# Patient Record
Sex: Female | Born: 1960 | Hispanic: Yes | Marital: Married | Smoking: Never smoker
Health system: Southern US, Community
[De-identification: ages and names within clinical notes are randomized; demographics above are authoritative.]

## PROBLEM LIST (undated history)

## (undated) DIAGNOSIS — E785 Hyperlipidemia, unspecified: Secondary | ICD-10-CM

## (undated) DIAGNOSIS — E1140 Type 2 diabetes mellitus with diabetic neuropathy, unspecified: Secondary | ICD-10-CM

## (undated) DIAGNOSIS — I10 Essential (primary) hypertension: Secondary | ICD-10-CM

## (undated) DIAGNOSIS — R51 Headache: Secondary | ICD-10-CM

## (undated) DIAGNOSIS — E119 Type 2 diabetes mellitus without complications: Secondary | ICD-10-CM

## (undated) DIAGNOSIS — I499 Cardiac arrhythmia, unspecified: Secondary | ICD-10-CM

## (undated) DIAGNOSIS — I2510 Atherosclerotic heart disease of native coronary artery without angina pectoris: Secondary | ICD-10-CM

## (undated) DIAGNOSIS — R519 Headache, unspecified: Secondary | ICD-10-CM

## (undated) DIAGNOSIS — R0600 Dyspnea, unspecified: Secondary | ICD-10-CM

## (undated) DIAGNOSIS — E875 Hyperkalemia: Secondary | ICD-10-CM

## (undated) HISTORY — DX: Atherosclerotic heart disease of native coronary artery without angina pectoris: I25.10

## (undated) HISTORY — PX: TUBAL LIGATION: SHX77

---

## 2002-11-21 ENCOUNTER — Ambulatory Visit (HOSPITAL_COMMUNITY): Admission: RE | Admit: 2002-11-21 | Discharge: 2002-11-21 | Payer: Self-pay | Admitting: Family Medicine

## 2010-02-11 ENCOUNTER — Ambulatory Visit (HOSPITAL_COMMUNITY): Admission: RE | Admit: 2010-02-11 | Discharge: 2010-02-11 | Payer: Self-pay | Admitting: Family Medicine

## 2011-06-02 ENCOUNTER — Other Ambulatory Visit (HOSPITAL_COMMUNITY): Payer: Self-pay | Admitting: Family Medicine

## 2011-06-02 DIAGNOSIS — Z139 Encounter for screening, unspecified: Secondary | ICD-10-CM

## 2011-06-12 ENCOUNTER — Ambulatory Visit (HOSPITAL_COMMUNITY)
Admission: RE | Admit: 2011-06-12 | Discharge: 2011-06-12 | Disposition: A | Discharge status: Home / Self Care | Payer: Self-pay | Source: Ambulatory Visit | Attending: Family Medicine | Admitting: Family Medicine

## 2011-06-12 DIAGNOSIS — Z139 Encounter for screening, unspecified: Secondary | ICD-10-CM

## 2012-08-05 ENCOUNTER — Other Ambulatory Visit (HOSPITAL_COMMUNITY): Payer: Self-pay | Admitting: Nurse Practitioner

## 2012-08-05 DIAGNOSIS — Z139 Encounter for screening, unspecified: Secondary | ICD-10-CM

## 2012-08-10 ENCOUNTER — Ambulatory Visit (HOSPITAL_COMMUNITY)
Admission: RE | Admit: 2012-08-10 | Discharge: 2012-08-10 | Disposition: A | Discharge status: Home / Self Care | Payer: Self-pay | Source: Ambulatory Visit | Attending: Nurse Practitioner | Admitting: Nurse Practitioner

## 2012-08-10 DIAGNOSIS — Z139 Encounter for screening, unspecified: Secondary | ICD-10-CM

## 2013-05-11 ENCOUNTER — Encounter (HOSPITAL_COMMUNITY): Payer: Self-pay | Admitting: *Deleted

## 2013-05-11 ENCOUNTER — Emergency Department (HOSPITAL_COMMUNITY)
Admission: EM | Admit: 2013-05-11 | Discharge: 2013-05-12 | Disposition: A | Discharge status: Home / Self Care | Payer: Self-pay | Attending: Emergency Medicine | Admitting: Emergency Medicine

## 2013-05-11 DIAGNOSIS — R5381 Other malaise: Secondary | ICD-10-CM | POA: Insufficient documentation

## 2013-05-11 DIAGNOSIS — R112 Nausea with vomiting, unspecified: Secondary | ICD-10-CM | POA: Insufficient documentation

## 2013-05-11 DIAGNOSIS — Z794 Long term (current) use of insulin: Secondary | ICD-10-CM | POA: Insufficient documentation

## 2013-05-11 DIAGNOSIS — B9789 Other viral agents as the cause of diseases classified elsewhere: Secondary | ICD-10-CM | POA: Insufficient documentation

## 2013-05-11 DIAGNOSIS — I10 Essential (primary) hypertension: Secondary | ICD-10-CM | POA: Insufficient documentation

## 2013-05-11 DIAGNOSIS — IMO0001 Reserved for inherently not codable concepts without codable children: Secondary | ICD-10-CM | POA: Insufficient documentation

## 2013-05-11 DIAGNOSIS — E119 Type 2 diabetes mellitus without complications: Secondary | ICD-10-CM | POA: Insufficient documentation

## 2013-05-11 DIAGNOSIS — R51 Headache: Secondary | ICD-10-CM | POA: Insufficient documentation

## 2013-05-11 DIAGNOSIS — B349 Viral infection, unspecified: Secondary | ICD-10-CM

## 2013-05-11 DIAGNOSIS — E785 Hyperlipidemia, unspecified: Secondary | ICD-10-CM | POA: Insufficient documentation

## 2013-05-11 DIAGNOSIS — R630 Anorexia: Secondary | ICD-10-CM | POA: Insufficient documentation

## 2013-05-11 DIAGNOSIS — Z79899 Other long term (current) drug therapy: Secondary | ICD-10-CM | POA: Insufficient documentation

## 2013-05-11 DIAGNOSIS — R5383 Other fatigue: Secondary | ICD-10-CM | POA: Insufficient documentation

## 2013-05-11 HISTORY — DX: Essential (primary) hypertension: I10

## 2013-05-11 HISTORY — DX: Hyperlipidemia, unspecified: E78.5

## 2013-05-11 MED ORDER — KETOROLAC TROMETHAMINE 60 MG/2ML IM SOLN
60.0000 mg | Freq: Once | INTRAMUSCULAR | Status: AC
  Administered 2013-05-12: 60 mg via INTRAMUSCULAR
  Filled 2013-05-11: qty 2

## 2013-05-11 MED ORDER — PANTOPRAZOLE SODIUM 40 MG PO TBEC
40.0000 mg | DELAYED_RELEASE_TABLET | Freq: Once | ORAL | Status: AC
  Administered 2013-05-12: 40 mg via ORAL
  Filled 2013-05-11: qty 1

## 2013-05-11 MED ORDER — DIPHENHYDRAMINE HCL 25 MG PO CAPS
25.0000 mg | ORAL_CAPSULE | Freq: Once | ORAL | Status: AC
  Administered 2013-05-12: 25 mg via ORAL
  Filled 2013-05-11: qty 1

## 2013-05-11 MED ORDER — ONDANSETRON HCL 4 MG/2ML IJ SOLN: 4.0000 mg | Freq: Once | INTRAMUSCULAR | Status: DC

## 2013-05-11 NOTE — ED Notes (Signed)
Patient said she had taken her insulin around 7pm. Patient said her glucose level before 7pm was around 360.

## 2013-05-11 NOTE — ED Notes (Addendum)
Pt reporting headache, fatigue, nausea and vomiting beginning on Friday.  Reporting blood sugar elevated as well, result at home 348.  Pt also reporting right shoulder pain "for a long time" she wants checked while she's here.

## 2013-05-11 NOTE — ED Provider Notes (Signed)
History    CSN: [id number] Arrival date & time 05/11/13  2114  First MD Initiated Contact with Patient 05/11/13 2311     Chief Complaint  Patient presents with  . Headache  . Nausea  . Emesis   (Consider location/radiation/quality/duration/timing/severity/associated sxs/prior Treatment) HPI HPI Comments: Jill [name] is a 52 y.o. female who presents to the Emergency Department complaining of headache, body aches, fatigue, nausea, some vomiting, that began an Friday and has continued. Her blood sugar levels have been high despite taking her insulin. She has taken no additional medicines.  PCP [facility] Moss Past Medical History  Diagnosis Date  . Diabetes mellitus without complication   . Hypertension   . Hyperlipidemia    History reviewed. No pertinent past surgical history. History reviewed. No pertinent family history. History  Substance Use Topics  . Smoking status: Never Smoker   . Smokeless tobacco: Not on file  . Alcohol Use: No   OB History   Grav Para Term Preterm Abortions TAB SAB Ect Mult Living                 Review of Systems  Constitutional: Positive for appetite change and fatigue. Negative for fever.       10 Systems reviewed and are negative for acute change except as noted in the HPI.  HENT: Negative for congestion.   Eyes: Negative for discharge and redness.  Respiratory: Negative for cough and shortness of breath.   Cardiovascular: Negative for chest pain.  Gastrointestinal: Positive for nausea and vomiting. Negative for abdominal pain.  Musculoskeletal: Positive for myalgias. Negative for back pain.  Skin: Negative for rash.  Neurological: Positive for headaches. Negative for syncope and numbness.  Psychiatric/Behavioral:       No behavior change.    Allergies  Review of patient's allergies indicates no known allergies.  Home Medications   Current Outpatient Rx  Name  Route  Sig  Dispense  Refill  . ibuprofen (ADVIL,MOTRIN) 800 MG  tablet   Oral   Take 800 mg by mouth every 8 (eight) hours as needed for pain.         Marland Kitchen insulin aspart (NOVOLOG) 100 UNIT/ML injection   Subcutaneous   Inject 10 Units into the skin 3 (three) times daily with meals.         Marland Kitchen lisinopril (PRINIVIL,ZESTRIL) 10 MG tablet   Oral   Take 10 mg by mouth daily.         . metFORMIN (GLUMETZA) 1000 MG (MOD) 24 hr tablet   Oral   Take 1,000 mg by mouth daily with breakfast.         . rosuvastatin (CRESTOR) 20 MG tablet   Oral   Take 20 mg by mouth daily.          BP 122/106  Pulse 100  Temp(Src) 100.1 F (37.8 C) (Oral)  Resp 20  Ht 5\' 2"  (1.575 m)  Wt 220 lb (99.791 kg)  BMI 40.23 kg/m2  SpO2 98% Physical Exam  Nursing note and vitals reviewed. Constitutional: She appears well-developed and well-nourished.  Awake, alert, nontoxic appearance.  HENT:  Head: Atraumatic.  Eyes: EOM are normal. Pupils are equal, round, and reactive to light.  Neck: Normal range of motion. Neck supple.  Cardiovascular: Normal rate and intact distal pulses.   Pulmonary/Chest: Effort normal and breath sounds normal. She exhibits no tenderness.  Abdominal: Soft. Bowel sounds are normal. There is no tenderness. There is no rebound.  Musculoskeletal: She exhibits  no tenderness.  Baseline ROM, no obvious new focal weakness.  Neurological:  Mental status and motor strength appears baseline for patient and situation.  Skin: No rash noted.  Psychiatric: She has a normal mood and affect.    ED Course  Procedures (including critical care time) Results for orders placed during the hospital encounter of 05/11/13  GLUCOSE, CAPILLARY      Result Value Range   Glucose-Capillary 261 (*) 70 - 99 mg/dL   Medications  ketorolac (TORADOL) injection 60 mg (60 mg Intramuscular Given 05/12/13 0007)  diphenhydrAMINE (BENADRYL) capsule 25 mg (25 mg Oral Given 05/12/13 0007)  pantoprazole (PROTONIX) EC tablet 40 mg (40 mg Oral Given 05/12/13 0007)   ondansetron (ZOFRAN-ODT) disintegrating tablet 4 mg (4 mg Oral Given 05/12/13 0006)   MDM  Patient with headache, nausea, fatigue, body aches and pains. Given PPI, zofran, toradol, benadryl with improvement. Pt stable in ED with no significant deterioration in condition.The patient appears reasonably screened and/or stabilized for discharge and I doubt any other medical condition or other [facility] requiring further screening, evaluation, or treatment in the ED at this time prior to discharge.  MDM Reviewed: nursing note and vitals     Nicoletta Dress. Colon Branch, MD 05/12/13 4098

## 2013-05-12 MED ORDER — ONDANSETRON 4 MG PO TBDP
4.0000 mg | ORAL_TABLET | Freq: Once | ORAL | Status: AC
  Administered 2013-05-12: 4 mg via ORAL
  Filled 2013-05-12: qty 1

## 2013-05-12 MED ORDER — ONDANSETRON 4 MG PO TBDP: 4.0000 mg | ORAL_TABLET | Freq: Three times a day (TID) | ORAL | Status: DC | PRN

## 2013-05-20 ENCOUNTER — Encounter (HOSPITAL_COMMUNITY): Payer: Self-pay | Admitting: *Deleted

## 2013-05-20 ENCOUNTER — Emergency Department (HOSPITAL_COMMUNITY)
Admission: EM | Admit: 2013-05-20 | Discharge: 2013-05-20 | Disposition: A | Discharge status: Home / Self Care | Payer: Self-pay | Attending: Emergency Medicine | Admitting: Emergency Medicine

## 2013-05-20 DIAGNOSIS — R739 Hyperglycemia, unspecified: Secondary | ICD-10-CM

## 2013-05-20 DIAGNOSIS — I10 Essential (primary) hypertension: Secondary | ICD-10-CM | POA: Insufficient documentation

## 2013-05-20 DIAGNOSIS — E875 Hyperkalemia: Secondary | ICD-10-CM | POA: Insufficient documentation

## 2013-05-20 DIAGNOSIS — E1169 Type 2 diabetes mellitus with other specified complication: Secondary | ICD-10-CM | POA: Insufficient documentation

## 2013-05-20 DIAGNOSIS — E785 Hyperlipidemia, unspecified: Secondary | ICD-10-CM | POA: Insufficient documentation

## 2013-05-20 DIAGNOSIS — Z794 Long term (current) use of insulin: Secondary | ICD-10-CM | POA: Insufficient documentation

## 2013-05-20 DIAGNOSIS — Z9851 Tubal ligation status: Secondary | ICD-10-CM | POA: Insufficient documentation

## 2013-05-20 DIAGNOSIS — N390 Urinary tract infection, site not specified: Secondary | ICD-10-CM | POA: Insufficient documentation

## 2013-05-20 DIAGNOSIS — Z79899 Other long term (current) drug therapy: Secondary | ICD-10-CM | POA: Insufficient documentation

## 2013-05-20 LAB — CBC WITH DIFFERENTIAL/PLATELET
Basophils Relative: 1 % (ref 0–1)
HCT: 40 % (ref 36.0–46.0)
Hemoglobin: 12.6 g/dL (ref 12.0–15.0)
Lymphs Abs: 3.9 10*3/uL (ref 0.7–4.0)
MCHC: 31.5 g/dL (ref 30.0–36.0)
Monocytes Absolute: 0.7 10*3/uL (ref 0.1–1.0)
Monocytes Relative: 7 % (ref 3–12)
Neutro Abs: 5.1 10*3/uL (ref 1.7–7.7)
RBC: 4.91 MIL/uL (ref 3.87–5.11)

## 2013-05-20 LAB — BASIC METABOLIC PANEL
BUN: 22 mg/dL (ref 6–23)
CO2: 26 mEq/L (ref 19–32)
CO2: 27 mEq/L (ref 19–32)
Calcium: 9 mg/dL (ref 8.4–10.5)
Chloride: 96 mEq/L (ref 96–112)
Creatinine, Ser: 0.89 mg/dL (ref 0.50–1.10)
GFR calc Af Amer: 85 mL/min — ABNORMAL LOW (ref >90)
GFR calc non Af Amer: 81 mL/min — ABNORMAL LOW (ref >90)
Glucose, Bld: 237 mg/dL — ABNORMAL HIGH (ref 70–99)
Glucose, Bld: 260 mg/dL — ABNORMAL HIGH (ref 70–99)
Potassium: 5 mEq/L (ref 3.5–5.1)
Potassium: 5 mEq/L (ref 3.5–5.1)
Sodium: 135 mEq/L (ref 135–145)

## 2013-05-20 LAB — URINALYSIS, ROUTINE W REFLEX MICROSCOPIC
Glucose, UA: 100 mg/dL — AB
Protein, ur: NEGATIVE mg/dL
Specific Gravity, Urine: 1.02 (ref 1.005–1.030)

## 2013-05-20 LAB — BLOOD GAS, VENOUS
Acid-Base Excess: 1.7 mmol/L (ref 0.0–2.0)
Bicarbonate: 26.5 mEq/L — ABNORMAL HIGH (ref 20.0–24.0)
TCO2: 24.3 mmol/L (ref 0–100)
pCO2, Ven: 47.7 mmHg (ref 45.0–50.0)
pH, Ven: 7.364 — ABNORMAL HIGH (ref 7.250–7.300)
pO2, Ven: 23.1 mmHg — CL (ref 30.0–45.0)

## 2013-05-20 LAB — POTASSIUM: Potassium: 5.6 mEq/L — ABNORMAL HIGH (ref 3.5–5.1)

## 2013-05-20 LAB — URINE MICROSCOPIC-ADD ON

## 2013-05-20 LAB — GLUCOSE, CAPILLARY: Glucose-Capillary: 227 mg/dL — ABNORMAL HIGH (ref 70–99)

## 2013-05-20 MED ORDER — SODIUM CHLORIDE 0.9 % IV BOLUS (SEPSIS)
1000.0000 mL | Freq: Once | INTRAVENOUS | Status: AC
  Administered 2013-05-20: 1000 mL via INTRAVENOUS

## 2013-05-20 MED ORDER — DEXTROSE 50 % IV SOLN
INTRAVENOUS | Status: AC
  Filled 2013-05-20: qty 50

## 2013-05-20 MED ORDER — CEPHALEXIN 500 MG PO CAPS: 500.0000 mg | ORAL_CAPSULE | Freq: Four times a day (QID) | ORAL | Status: DC

## 2013-05-20 MED ORDER — INSULIN ASPART 100 UNIT/ML ~~LOC~~ SOLN
10.0000 [IU] | Freq: Once | SUBCUTANEOUS | Status: AC
  Administered 2013-05-20: 10 [IU] via SUBCUTANEOUS
  Filled 2013-05-20: qty 1

## 2013-05-20 MED ORDER — SODIUM BICARBONATE 8.4 % IV SOLN
50.0000 meq | Freq: Once | INTRAVENOUS | Status: AC
  Administered 2013-05-20: 50 meq via INTRAVENOUS
  Filled 2013-05-20: qty 50

## 2013-05-20 MED ORDER — DEXTROSE 50 % IV SOLN
50.0000 mL | Freq: Once | INTRAVENOUS | Status: AC
  Administered 2013-05-20: 50 mL via INTRAVENOUS

## 2013-05-20 MED ORDER — SODIUM POLYSTYRENE SULFONATE 15 GM/60ML PO SUSP
45.0000 g | Freq: Once | ORAL | Status: AC
  Administered 2013-05-20: 45 g via ORAL
  Filled 2013-05-20: qty 180

## 2013-05-20 MED ORDER — CEPHALEXIN 500 MG PO CAPS
500.0000 mg | ORAL_CAPSULE | Freq: Once | ORAL | Status: AC
  Administered 2013-05-20: 500 mg via ORAL
  Filled 2013-05-20: qty 1

## 2013-05-20 NOTE — ED Notes (Signed)
Dr.rancour notified of pt's venous abg results

## 2013-05-20 NOTE — ED Notes (Signed)
Dr.rancour to see pt.

## 2013-05-20 NOTE — ED Provider Notes (Signed)
CSN: [id number]     Arrival date & time 05/20/13  0915 History    This chart was scribed for Glynn Octave, MD by Quintella Reichert, ED scribe.  This patient was seen in room APA04/APA04 and the patient's care was started at 9:30 AM.     Chief Complaint  Patient presents with  . Hyperkalemia    The history is provided by the patient. No language interpreter was used.    HPI Comments: Jill [name] is a 52 y.o. female with h/o DM, HTN and hyperlipidemia who presents to the Emergency Department complaining of hyperkalemia based on blood-work taken yesterday.  Pt reports that yesterday she [facility] and today she was called and informed that her labs revealed elevated potassium levels and advised to come to the ED.  Currently she is asymptomatic.  She denies fever, HA, CP, abdominal pain, emesis, diarrhea, constipation, urinary frequency, dysuria, cough, sore throat or any other associated symptoms.  She has been eating and drinking normally.  Pt also notes that she has been having blood glucose readings above 400 on her home meter for the past week and has recently been experiencing severe intermittent bilateral cramping leg pain.  Presently she denies leg pain.  She notes that she did not take any of her medications this morning.  Otherwise she denies any recent missed medication doses or changes to medication.    Past Medical History  Diagnosis Date  . Diabetes mellitus without complication   . Hypertension   . Hyperlipidemia     Past Surgical History  Procedure Laterality Date  . Tubal ligation       History reviewed. No pertinent family history.   History  Substance Use Topics  . Smoking status: Never Smoker   . Smokeless tobacco: Not on file  . Alcohol Use: No    OB History   Grav Para Term Preterm Abortions TAB SAB Ect Mult Living   4 4 4               Review of Systems A complete 10 system review of systems was obtained and  all systems are negative except as noted in the HPI and PMH.     Allergies  Review of patient's allergies indicates no known allergies.  Home Medications   Current Outpatient Rx  Name  Route  Sig  Dispense  Refill  . ibuprofen (ADVIL,MOTRIN) 800 MG tablet   Oral   Take 800 mg by mouth every 8 (eight) hours as needed for pain.         Marland Kitchen insulin aspart (NOVOLOG) 100 UNIT/ML injection   Subcutaneous   Inject 10 Units into the skin 3 (three) times daily with meals.         Marland Kitchen lisinopril (PRINIVIL,ZESTRIL) 10 MG tablet   Oral   Take 10 mg by mouth daily.         . metFORMIN (GLUMETZA) 1000 MG (MOD) 24 hr tablet   Oral   Take 1,000 mg by mouth 2 (two) times daily with a meal.          . ondansetron (ZOFRAN ODT) 4 MG disintegrating tablet   Oral   Take 1 tablet (4 mg total) by mouth every 8 (eight) hours as needed for nausea.   20 tablet   0   . rosuvastatin (CRESTOR) 20 MG tablet   Oral   Take 20 mg by mouth daily.         Marland Kitchen  cephALEXin (KEFLEX) 500 MG capsule   Oral   Take 1 capsule (500 mg total) by mouth 4 (four) times daily.   40 capsule   0    BP 145/77  Pulse 85  Temp(Src) 98.4 F (36.9 C) (Oral)  Resp 14  Ht 5\' 3"  (1.6 m)  Wt 225 lb (102.059 kg)  BMI 39.87 kg/m2  SpO2 99%  Physical Exam  Nursing note and vitals reviewed. Constitutional: She is oriented to person, place, and time. She appears well-developed and well-nourished. No distress.  HENT:  Head: Normocephalic and atraumatic.  Mouth/Throat: Mucous membranes are normal.  Eyes: Conjunctivae and EOM are normal.  Neck: Neck supple.  Cardiovascular: Normal rate, regular rhythm and normal heart sounds.   No murmur heard. Pulmonary/Chest: Effort normal and breath sounds normal. No respiratory distress. She has no wheezes. She has no rales.  Abdominal: Soft. There is no tenderness.  Musculoskeletal: Normal range of motion.  Neurological: She is alert and oriented to person, place, and time.  She has normal strength.  No ataxia on finger to nose, 5/5 strength throughout   Skin: Skin is warm and dry.  Psychiatric: She has a normal mood and affect. Her behavior is normal.    ED Course  Procedures (including critical care time)  DIAGNOSTIC STUDIES: Oxygen Saturation is 99% on room air, normal by my interpretation.    COORDINATION OF CARE: 9:35 AM-Discussed treatment plan which includes EKG and labs with pt at bedside and pt agreed to plan.   10:53 AM: Informed pt that labs rule out severe hyperkalemia but did reveal UTI.  Discussed treatment plan which includes antibiotics with pt at bedside and pt agreed to plan.   11:14 AM: Informed pt that labs now show a higher potassium level.  Pt mentions that she is already on Macrobid for a UTI.  Will treat hyperkalemia, d/c lisinopril.   Labs Reviewed  GLUCOSE, CAPILLARY - Abnormal; Notable for the following:    Glucose-Capillary 227 (*)    All other components within normal limits  CBC WITH DIFFERENTIAL - Abnormal; Notable for the following:    MCH 25.7 (*)    Platelets 527 (*)    All other components within normal limits  BASIC METABOLIC PANEL - Abnormal; Notable for the following:    Sodium 133 (*)    Glucose, Bld 237 (*)    GFR calc non Af Amer 73 (*)    GFR calc Af Amer 85 (*)    All other components within normal limits  BLOOD GAS, VENOUS - Abnormal; Notable for the following:    pH, Ven 7.364 (*)    pO2, Ven 23.1 (*)    Bicarbonate 26.5 (*)    All other components within normal limits  URINALYSIS, ROUTINE W REFLEX MICROSCOPIC - Abnormal; Notable for the following:    Glucose, UA 100 (*)    Hgb urine dipstick TRACE (*)    Leukocytes, UA MODERATE (*)    All other components within normal limits  URINE MICROSCOPIC-ADD ON - Abnormal; Notable for the following:    Squamous Epithelial / LPF MANY (*)    Bacteria, UA MANY (*)    All other components within normal limits  POTASSIUM - Abnormal; Notable for the following:     Potassium 5.6 (*)    All other components within normal limits  BASIC METABOLIC PANEL - Abnormal; Notable for the following:    Glucose, Bld 260 (*)    GFR calc non Af Amer 81 (*)  All other components within normal limits  URINE CULTURE  KETONES, QUALITATIVE    No results found.  1. Hyperkalemia   2. Hyperglycemia   3. Urinary tract infection     MDM  Hx DM, saw health department yesterday for routine labs and referred to ED for potassium of 6.3. Denies symptoms. No fever, chest pain, SOB, nausea, vomiting, abdominal pain.  Suspect hyperkalemia report was spurious. No EKG changes to suggest hyperkalemia. No evidence of DKA. Anion gap 9, sugar 237. Will treat UTI with keflex.  K is 5 on labs today. No EKG changes.  Cr normal. K 5.6 on recheck. Will treat.  Stop lisinopril.   K down to 5. Patient asymptomatic.  Followup for recheck of chemistry with PCP.  Stop lisinopril. Keflex for UTI.   Date: 05/20/2013  Rate: 80  Rhythm: normal sinus rhythm  QRS Axis: normal  Intervals: normal  ST/T Wave abnormalities: normal  Conduction Disutrbances:none  Narrative Interpretation:   Old EKG Reviewed: none available      I personally performed the services described in this documentation, which was scribed in my presence. The recorded information has been reviewed and is accurate.    Glynn Octave, MD 05/20/13 [phone]

## 2013-05-20 NOTE — ED Notes (Signed)
Patient has been having greater than 400 glucose readings on home meter x 1 week.  Saw MD at health department yesterday and had labs drawn.  This AM they called and instructed her to come to ER d/t hyperkalemia.  Their labs indicated a blood glucose of 131.

## 2013-05-21 LAB — URINE CULTURE: Colony Count: >=100000

## 2014-03-08 ENCOUNTER — Emergency Department (HOSPITAL_COMMUNITY): Payer: No Typology Code available for payment source

## 2014-03-08 ENCOUNTER — Emergency Department (HOSPITAL_COMMUNITY)
Admission: EM | Admit: 2014-03-08 | Discharge: 2014-03-08 | Disposition: A | Discharge status: Home / Self Care | Payer: No Typology Code available for payment source | Attending: Emergency Medicine | Admitting: Emergency Medicine

## 2014-03-08 ENCOUNTER — Encounter (HOSPITAL_COMMUNITY): Payer: Self-pay | Admitting: Emergency Medicine

## 2014-03-08 DIAGNOSIS — E119 Type 2 diabetes mellitus without complications: Secondary | ICD-10-CM | POA: Insufficient documentation

## 2014-03-08 DIAGNOSIS — Z79899 Other long term (current) drug therapy: Secondary | ICD-10-CM | POA: Insufficient documentation

## 2014-03-08 DIAGNOSIS — S40019A Contusion of unspecified shoulder, initial encounter: Secondary | ICD-10-CM | POA: Insufficient documentation

## 2014-03-08 DIAGNOSIS — Z792 Long term (current) use of antibiotics: Secondary | ICD-10-CM | POA: Insufficient documentation

## 2014-03-08 DIAGNOSIS — E785 Hyperlipidemia, unspecified: Secondary | ICD-10-CM | POA: Insufficient documentation

## 2014-03-08 DIAGNOSIS — Z794 Long term (current) use of insulin: Secondary | ICD-10-CM | POA: Insufficient documentation

## 2014-03-08 DIAGNOSIS — Y9389 Activity, other specified: Secondary | ICD-10-CM | POA: Insufficient documentation

## 2014-03-08 DIAGNOSIS — I10 Essential (primary) hypertension: Secondary | ICD-10-CM | POA: Insufficient documentation

## 2014-03-08 DIAGNOSIS — S0993XA Unspecified injury of face, initial encounter: Secondary | ICD-10-CM | POA: Insufficient documentation

## 2014-03-08 DIAGNOSIS — S199XXA Unspecified injury of neck, initial encounter: Secondary | ICD-10-CM

## 2014-03-08 DIAGNOSIS — Y92410 Unspecified street and highway as the place of occurrence of the external cause: Secondary | ICD-10-CM | POA: Insufficient documentation

## 2014-03-08 DIAGNOSIS — S40011A Contusion of right shoulder, initial encounter: Secondary | ICD-10-CM

## 2014-03-08 NOTE — Discharge Instructions (Signed)
Follow up with your md for recheck if needed.  Motrin for pain

## 2014-03-08 NOTE — ED Notes (Signed)
Backboard removed, pt denies point tenderness to back and neck, neck remains in C-collar, pt co rt arm/shoulder pain/tenderness/tingling.

## 2014-03-08 NOTE — ED Notes (Signed)
Discharge instructions reviewed with pt, questions answered. Pt verbalized understanding.  
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
## 2014-08-21 ENCOUNTER — Encounter (HOSPITAL_COMMUNITY): Payer: Self-pay | Admitting: Emergency Medicine

## 2016-03-05 ENCOUNTER — Observation Stay (HOSPITAL_COMMUNITY): Payer: Self-pay

## 2016-03-05 ENCOUNTER — Emergency Department (HOSPITAL_COMMUNITY): Payer: Self-pay

## 2016-03-05 ENCOUNTER — Inpatient Hospital Stay (HOSPITAL_COMMUNITY)
Admission: EM | Admit: 2016-03-05 | Discharge: 2016-03-11 | DRG: 193 | Disposition: A | Discharge status: Home / Self Care | Payer: Self-pay | Attending: Internal Medicine | Admitting: Internal Medicine

## 2016-03-05 ENCOUNTER — Encounter (HOSPITAL_COMMUNITY): Payer: Self-pay

## 2016-03-05 DIAGNOSIS — E785 Hyperlipidemia, unspecified: Secondary | ICD-10-CM | POA: Diagnosis present

## 2016-03-05 DIAGNOSIS — E860 Dehydration: Secondary | ICD-10-CM | POA: Diagnosis present

## 2016-03-05 DIAGNOSIS — R739 Hyperglycemia, unspecified: Secondary | ICD-10-CM | POA: Diagnosis present

## 2016-03-05 DIAGNOSIS — I10 Essential (primary) hypertension: Secondary | ICD-10-CM | POA: Diagnosis present

## 2016-03-05 DIAGNOSIS — E119 Type 2 diabetes mellitus without complications: Secondary | ICD-10-CM

## 2016-03-05 DIAGNOSIS — R0902 Hypoxemia: Secondary | ICD-10-CM | POA: Diagnosis present

## 2016-03-05 DIAGNOSIS — J9600 Acute respiratory failure, unspecified whether with hypoxia or hypercapnia: Secondary | ICD-10-CM

## 2016-03-05 DIAGNOSIS — Z23 Encounter for immunization: Secondary | ICD-10-CM

## 2016-03-05 DIAGNOSIS — D72829 Elevated white blood cell count, unspecified: Secondary | ICD-10-CM | POA: Diagnosis present

## 2016-03-05 DIAGNOSIS — J189 Pneumonia, unspecified organism: Secondary | ICD-10-CM | POA: Diagnosis present

## 2016-03-05 DIAGNOSIS — E871 Hypo-osmolality and hyponatremia: Secondary | ICD-10-CM | POA: Diagnosis present

## 2016-03-05 DIAGNOSIS — J180 Bronchopneumonia, unspecified organism: Principal | ICD-10-CM | POA: Diagnosis present

## 2016-03-05 DIAGNOSIS — E1165 Type 2 diabetes mellitus with hyperglycemia: Secondary | ICD-10-CM | POA: Diagnosis present

## 2016-03-05 DIAGNOSIS — IMO0001 Reserved for inherently not codable concepts without codable children: Secondary | ICD-10-CM

## 2016-03-05 DIAGNOSIS — E1142 Type 2 diabetes mellitus with diabetic polyneuropathy: Secondary | ICD-10-CM | POA: Diagnosis present

## 2016-03-05 DIAGNOSIS — D649 Anemia, unspecified: Secondary | ICD-10-CM | POA: Diagnosis present

## 2016-03-05 DIAGNOSIS — J9601 Acute respiratory failure with hypoxia: Secondary | ICD-10-CM | POA: Diagnosis present

## 2016-03-05 DIAGNOSIS — Z8249 Family history of ischemic heart disease and other diseases of the circulatory system: Secondary | ICD-10-CM

## 2016-03-05 DIAGNOSIS — Z833 Family history of diabetes mellitus: Secondary | ICD-10-CM

## 2016-03-05 DIAGNOSIS — Z794 Long term (current) use of insulin: Secondary | ICD-10-CM

## 2016-03-05 LAB — COMPREHENSIVE METABOLIC PANEL
ALK PHOS: 68 U/L (ref 38–126)
ALT: 13 U/L — AB (ref 14–54)
AST: 15 U/L (ref 15–41)
Albumin: 3.7 g/dL (ref 3.5–5.0)
Anion gap: 8 (ref 5–15)
BUN: 29 mg/dL — AB (ref 6–20)
CALCIUM: 8.7 mg/dL — AB (ref 8.9–10.3)
CHLORIDE: 101 mmol/L (ref 101–111)
CO2: 25 mmol/L (ref 22–32)
CREATININE: 0.93 mg/dL (ref 0.44–1.00)
GFR calc non Af Amer: >60 mL/min (ref >60)
GLUCOSE: 355 mg/dL — AB (ref 65–99)
Potassium: 4.7 mmol/L (ref 3.5–5.1)
SODIUM: 134 mmol/L — AB (ref 135–145)
Total Bilirubin: 0.4 mg/dL (ref 0.3–1.2)
Total Protein: 7.7 g/dL (ref 6.5–8.1)

## 2016-03-05 LAB — CBC WITH DIFFERENTIAL/PLATELET
BASOS ABS: 0 10*3/uL (ref 0.0–0.1)
Basophils Relative: 0 %
EOS ABS: 0.1 10*3/uL (ref 0.0–0.7)
EOS PCT: 0 %
HCT: 35.3 % — ABNORMAL LOW (ref 36.0–46.0)
HEMOGLOBIN: 11.2 g/dL — AB (ref 12.0–15.0)
LYMPHS ABS: 1.2 10*3/uL (ref 0.7–4.0)
LYMPHS PCT: 7 %
MCH: 26.4 pg (ref 26.0–34.0)
MCHC: 31.7 g/dL (ref 30.0–36.0)
MCV: 83.1 fL (ref 78.0–100.0)
Monocytes Absolute: 0.9 10*3/uL (ref 0.1–1.0)
Monocytes Relative: 5 %
NEUTROS PCT: 88 %
Neutro Abs: 14.8 10*3/uL — ABNORMAL HIGH (ref 1.7–7.7)
PLATELETS: 304 10*3/uL (ref 150–400)
RBC: 4.25 MIL/uL (ref 3.87–5.11)
RDW: 13.5 % (ref 11.5–15.5)
WBC: 16.9 10*3/uL — AB (ref 4.0–10.5)

## 2016-03-05 LAB — URINALYSIS, ROUTINE W REFLEX MICROSCOPIC
BILIRUBIN URINE: NEGATIVE
HGB URINE DIPSTICK: NEGATIVE
Leukocytes, UA: NEGATIVE
Nitrite: NEGATIVE
PH: 5.5 (ref 5.0–8.0)
Protein, ur: NEGATIVE mg/dL
SPECIFIC GRAVITY, URINE: 1.01 (ref 1.005–1.030)

## 2016-03-05 LAB — CBG MONITORING, ED: GLUCOSE-CAPILLARY: 334 mg/dL — AB (ref 65–99)

## 2016-03-05 LAB — GLUCOSE, CAPILLARY: GLUCOSE-CAPILLARY: 258 mg/dL — AB (ref 65–99)

## 2016-03-05 LAB — URINE MICROSCOPIC-ADD ON

## 2016-03-05 LAB — D-DIMER, QUANTITATIVE: D-Dimer, Quant: 0.65 ug/mL-FEU — ABNORMAL HIGH (ref 0.00–0.50)

## 2016-03-05 LAB — PROCALCITONIN: Procalcitonin: <0.1 ng/mL

## 2016-03-05 MED ORDER — ACETAMINOPHEN 325 MG PO TABS
650.0000 mg | ORAL_TABLET | Freq: Four times a day (QID) | ORAL | Status: DC | PRN
  Administered 2016-03-05 – 2016-03-10 (×9): 650 mg via ORAL
  Filled 2016-03-05 (×10): qty 2

## 2016-03-05 MED ORDER — GABAPENTIN 400 MG PO CAPS
400.0000 mg | ORAL_CAPSULE | Freq: Once | ORAL | Status: AC
  Administered 2016-03-05: 400 mg via ORAL
  Filled 2016-03-05: qty 1

## 2016-03-05 MED ORDER — ONDANSETRON HCL 4 MG/2ML IJ SOLN
4.0000 mg | Freq: Once | INTRAMUSCULAR | Status: AC
  Administered 2016-03-05: 4 mg via INTRAVENOUS
  Filled 2016-03-05: qty 2

## 2016-03-05 MED ORDER — GABAPENTIN 400 MG PO CAPS
400.0000 mg | ORAL_CAPSULE | Freq: Three times a day (TID) | ORAL | Status: DC
  Administered 2016-03-06 – 2016-03-11 (×16): 400 mg via ORAL
  Filled 2016-03-05 (×16): qty 1

## 2016-03-05 MED ORDER — IOPAMIDOL (ISOVUE-370) INJECTION 76%
100.0000 mL | Freq: Once | INTRAVENOUS | Status: AC | PRN
  Administered 2016-03-05: 100 mL via INTRAVENOUS

## 2016-03-05 MED ORDER — INSULIN ASPART 100 UNIT/ML ~~LOC~~ SOLN
4.0000 [IU] | Freq: Three times a day (TID) | SUBCUTANEOUS | Status: DC
  Administered 2016-03-06 – 2016-03-11 (×13): 4 [IU] via SUBCUTANEOUS

## 2016-03-05 MED ORDER — SODIUM CHLORIDE 0.9 % IV BOLUS (SEPSIS)
500.0000 mL | Freq: Once | INTRAVENOUS | Status: AC
  Administered 2016-03-05: 500 mL via INTRAVENOUS

## 2016-03-05 MED ORDER — ENOXAPARIN SODIUM 40 MG/0.4ML ~~LOC~~ SOLN
40.0000 mg | SUBCUTANEOUS | Status: DC
  Administered 2016-03-05 – 2016-03-10 (×6): 40 mg via SUBCUTANEOUS
  Filled 2016-03-05 (×6): qty 0.4

## 2016-03-05 MED ORDER — PNEUMOCOCCAL VAC POLYVALENT 25 MCG/0.5ML IJ INJ: 0.5000 mL | INJECTION | INTRAMUSCULAR | Status: DC

## 2016-03-05 MED ORDER — INSULIN ASPART 100 UNIT/ML ~~LOC~~ SOLN
10.0000 [IU] | Freq: Once | SUBCUTANEOUS | Status: AC
  Administered 2016-03-05: 10 [IU] via SUBCUTANEOUS

## 2016-03-05 MED ORDER — DEXTROSE 5 % IV SOLN
2.0000 g | INTRAVENOUS | Status: DC
  Administered 2016-03-06 – 2016-03-11 (×6): 2 g via INTRAVENOUS
  Filled 2016-03-05 (×7): qty 2

## 2016-03-05 MED ORDER — AZITHROMYCIN 250 MG PO TABS
500.0000 mg | ORAL_TABLET | Freq: Once | ORAL | Status: AC
  Administered 2016-03-05: 500 mg via ORAL
  Filled 2016-03-05: qty 2

## 2016-03-05 MED ORDER — INSULIN ASPART 100 UNIT/ML ~~LOC~~ SOLN
0.0000 [IU] | Freq: Three times a day (TID) | SUBCUTANEOUS | Status: DC
  Administered 2016-03-06: 2 [IU] via SUBCUTANEOUS
  Administered 2016-03-06: 5 [IU] via SUBCUTANEOUS
  Administered 2016-03-06 – 2016-03-07 (×2): 3 [IU] via SUBCUTANEOUS
  Administered 2016-03-07: 8 [IU] via SUBCUTANEOUS
  Administered 2016-03-07: 3 [IU] via SUBCUTANEOUS
  Administered 2016-03-08: 8 [IU] via SUBCUTANEOUS
  Administered 2016-03-08 (×2): 3 [IU] via SUBCUTANEOUS
  Administered 2016-03-09: 2 [IU] via SUBCUTANEOUS
  Administered 2016-03-09 – 2016-03-10 (×3): 3 [IU] via SUBCUTANEOUS
  Administered 2016-03-10: 2 [IU] via SUBCUTANEOUS
  Administered 2016-03-10: 3 [IU] via SUBCUTANEOUS
  Administered 2016-03-11: 2 [IU] via SUBCUTANEOUS
  Administered 2016-03-11: 3 [IU] via SUBCUTANEOUS

## 2016-03-05 MED ORDER — SODIUM CHLORIDE 0.9 % IV SOLN
INTRAVENOUS | Status: AC
  Administered 2016-03-05: 19:00:00 via INTRAVENOUS

## 2016-03-05 MED ORDER — DEXTROSE 5 % IV SOLN
1.0000 g | Freq: Once | INTRAVENOUS | Status: AC
  Administered 2016-03-05: 1 g via INTRAVENOUS
  Filled 2016-03-05: qty 10

## 2016-03-05 MED ORDER — SODIUM CHLORIDE 0.9 % IV BOLUS (SEPSIS)
1000.0000 mL | Freq: Once | INTRAVENOUS | Status: AC
  Administered 2016-03-05: 1000 mL via INTRAVENOUS

## 2016-03-05 MED ORDER — DEXTROSE 5 % IV SOLN
500.0000 mg | INTRAVENOUS | Status: DC
  Administered 2016-03-06 – 2016-03-09 (×4): 500 mg via INTRAVENOUS
  Filled 2016-03-05 (×5): qty 500

## 2016-03-05 MED ORDER — IBUPROFEN 400 MG PO TABS
600.0000 mg | ORAL_TABLET | Freq: Once | ORAL | Status: AC
  Administered 2016-03-05: 600 mg via ORAL
  Filled 2016-03-05: qty 2

## 2016-03-05 NOTE — ED Notes (Signed)
CBG 334 in triage

## 2016-03-05 NOTE — ED Notes (Signed)
Hospitalist at bedside at this time 

## 2016-03-05 NOTE — ED Notes (Signed)
Pt was able to walk to the bathroom and back to her room holding on to me. Her oxygen was steady at 85 percent while ambulating.

## 2016-03-05 NOTE — Progress Notes (Signed)
Pharmacy Antibiotic Note  Jill [name] is a 55 y.o. female admitted on 03/05/2016 with pneumonia.  Pharmacy has been consulted for rocephin dosing.  Patient received rocephin 1g x1 at 1605 today.  Plan: Rocephin 2g IV q24h starting 5/18 at 0600  Height: 5\' 3"  (160 cm) Weight: 191 lb 8 oz (86.864 kg) IBW/kg (Calculated) : 52.4  Temp (24hrs), Avg:99.3 F (37.4 C), Min:98.2 F (36.8 C), Max:102.2 F (39 C)   Recent Labs Lab 03/05/16 1158  WBC 16.9*  CREATININE 0.93    Estimated Creatinine Clearance: 71.4 mL/min (by C-G formula based on Cr of 0.93).    No Known Allergies  Thank you for allowing pharmacy to be a part of this patient's care.  Drusilla Kanner 03/05/2016 11:21 PM

## 2016-03-05 NOTE — H&P (Signed)
History and Physical    Jill [name] ZOX:[id number] DOB: 1961/05/30 DOA: 03/05/2016  PCP: [facility]   Patient coming from: Home  Chief Complaint: Generalized weakness, nausea vomiting  HPI: Jill [name] is a 55 y.o. 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 Patient reports pain in her usual state of health until approximately 2 months ago when she noted the insidious development of generalized weakness and dizziness, particularly upon waking. There has been no chest pain, palpitations, edema, or orthopnea over this interval. She endorses an occasional cough productive of sputum but denies any significant dyspnea. Up until yesterday, there is no nausea or vomiting associated with this. There is been no diarrhea. Patient denies any recent long distance travel. Symptoms began to worsen significantly over the past day, marked by increasing weakness and malaise. She reports seeing a small streak of blood in her sputum yesterday and a fleck of blood in her emesis earlier today. She denies melena or hematochezia. She is not anticoagulated and does not take any antiplatelet.   ED Course: Upon arrival to the ED, patient is found to be afebrile, saturating low 90s on 2 L/m supplemental oxygen, tachycardic in the low 100s, and with blood pressure in the 90/55 range. EKG featured a sinus tachycardia with low voltage QRS. Chest x-ray is negative for acute cardiopulmonary disease. Urine was obtained for analysis and features greater than 1000 glucose and trace ketones. CMP is notable for a mild hyponatremia, WJX:BJYNWGNFAO ratio of >30, and glucose of 355. CBC features a leukocytosis of 16,900 and hemoglobin of 11.2 with  normal MCV. A bolus of 2 L normal saline was administered in the emergency department and symptomatic care was provided with Zofran, gabapentin, and ibuprofen. Patient was started on empiric Rocephin and azithromycin for suspected CAP. Patient ambulated to the commode with her supplemental oxygen but desaturated to the 80s and was symptomatic. Tachycardia has resolved with IV fluids but the patient continues to require supplemental oxygen. She will be admitted to the hospital for ongoing evaluation and management of malaise, hyperglycemia, and new supplemental oxygen requirement.  Review of Systems:  All other systems reviewed and apart from HPI, are negative.  Past Medical History  Diagnosis Date  . Diabetes mellitus without complication (HCC)   . Hypertension   . Hyperlipidemia     Past Surgical History  Procedure Laterality Date  . Tubal ligation       reports that she has never smoked. She does not have any smokeless tobacco history on file. She reports that she does not drink alcohol or use illicit drugs.  No Known Allergies  Family History  Problem Relation Age of Onset  . Diabetes type II Other   . Hypertension Other      Prior to Admission medications   Medication Sig Start Date End Date Taking? Authorizing Provider  insulin aspart (NOVOLOG) 100 UNIT/ML injection Inject 10 Units into the skin 3 (three) times daily with meals.   Yes Historical Provider, MD  lisinopril (PRINIVIL,ZESTRIL) 10 MG tablet Take 10 mg by mouth daily.   Yes Historical Provider, MD  metFORMIN (GLUMETZA) 1000 MG (MOD) 24 hr tablet Take 1,000 mg by mouth 2 (two) times daily with a meal.    Yes Historical Provider, MD  rosuvastatin (CRESTOR) 20 MG tablet  Take 20 mg by mouth daily.   Yes Historical Provider, MD    Physical Exam: Filed Vitals:   03/05/16 1415 03/05/16 1430 03/05/16 1445 03/05/16 1500  BP:  107/59    Pulse: 85 85 86 89  Temp:  98.2 F (36.8 C)    TempSrc:  Oral    Resp: Height:      Weight:      SpO2: 95% 93% 94% 92%      Constitutional: NAD, calm, comfortable Eyes: PERTLA, lids and conjunctivae normal ENMT: Mucous membranes are moist. Posterior pharynx clear of any exudate or lesions.   Neck: normal, supple, no masses, no thyromegaly Respiratory: clear to auscultation bilaterally, no wheezing, no crackles. Normal respiratory effort.   Cardiovascular: S1 & S2 heard, regular rate and rhythm, no significant murmurs / rubs / gallops. No extremity edema. 2+ pedal pulses. No carotid bruits. No significant JVD. Abdomen: No distension, no tenderness, no masses palpated. Bowel sounds normal.  Musculoskeletal: no clubbing / cyanosis. No joint deformity upper and lower extremities. Normal muscle tone.  Skin: no significant rashes, lesions, ulcers. Warm, dry, well-perfused. Neurologic: CN 2-12 grossly intact. Sensation intact, DTR normal. Strength 5/5 in all 4 limbs.  Psychiatric: Normal judgment and insight. Alert and oriented x 3. Normal mood and affect.     Labs on Admission: I have personally reviewed following labs and imaging studies  CBC:  Recent Labs Lab 03/05/16 1158  WBC 16.9*  NEUTROABS 14.8*  HGB 11.2*  HCT 35.3*  MCV 83.1  PLT 304   Basic Metabolic Panel:  Recent Labs Lab 03/05/16 1158  NA 134*  K 4.7  CL 101  CO2 25  GLUCOSE 355*  BUN 29*  CREATININE 0.93  CALCIUM 8.7*   GFR: Estimated Creatinine Clearance: 67.9 mL/min (by C-G formula based on Cr of 0.93). Liver Function Tests:  Recent Labs Lab 03/05/16 1158  AST 15  ALT 13*  ALKPHOS 68  BILITOT 0.4  PROT 7.7  ALBUMIN 3.7   No results for input(s): LIPASE, AMYLASE in the last 168 hours. No results for input(s): AMMONIA in the last 168 hours. Coagulation Profile: No results for input(s): INR, PROTIME in the last 168 hours. Cardiac Enzymes: No results for input(s): CKTOTAL, CKMB, CKMBINDEX, TROPONINI in the last 168 hours. BNP (last 3 results) No results for  input(s): PROBNP in the last 8760 hours. HbA1C: No results for input(s): HGBA1C in the last 72 hours. CBG:  Recent Labs Lab 03/05/16 1100  GLUCAP 334*   Lipid Profile: No results for input(s): CHOL, HDL, LDLCALC, TRIG, CHOLHDL, LDLDIRECT in the last 72 hours. Thyroid Function Tests: No results for input(s): TSH, T4TOTAL, FREET4, T3FREE, THYROIDAB in the last 72 hours. Anemia Panel: No results for input(s): VITAMINB12, FOLATE, FERRITIN, TIBC, IRON, RETICCTPCT in the last 72 hours. Urine analysis:    Component Value Date/Time   COLORURINE YELLOW 03/05/2016 1320   APPEARANCEUR CLEAR 03/05/2016 1320   LABSPEC 1.010 03/05/2016 1320   PHURINE 5.5 03/05/2016 1320   GLUCOSEU >1000* 03/05/2016 1320   HGBUR NEGATIVE 03/05/2016 1320   BILIRUBINUR NEGATIVE 03/05/2016 1320   KETONESUR TRACE* 03/05/2016 1320   PROTEINUR NEGATIVE 03/05/2016 1320   UROBILINOGEN 0.2 05/20/2013 0943   NITRITE NEGATIVE 03/05/2016 1320   LEUKOCYTESUR NEGATIVE 03/05/2016 1320   Sepsis Labs: (procalcitonin:4,lacticidven:4) )No results found for this or any previous visit (from the past 240 hour(s)).   Radiological Exams on Admission: Dg Chest 2 View  03/05/2016  CLINICAL  DATA:  Productive cough. EXAM: CHEST  2 VIEW COMPARISON:  None. FINDINGS: The heart size and mediastinal contours are within normal limits. Both lungs are clear. The visualized skeletal structures are unremarkable. IMPRESSION: No active cardiopulmonary disease. Electronically Signed   By: Lupita Raider, M.D.   On: 03/05/2016 11:55    EKG: Independently reviewed. Sinus tachycardia (rate 104), low-voltage QRS  Assessment/Plan  1. Acute hypoxic respiratory failure  - Etiology uncertain  - CXR without evidence of PNA   - No peripheral edema, JVD, gallop, or edema on CXR  - No risk factors for VTE identified, pre-test probability low, will rule-out PE with d-dimer; CTA PE study if d-dimer positive  - Empiric treatment for CAP  started in ED, will check PCT and continue pending additional workup - Titrate FiO2 to maintain sat >92%    2. Leukocytosis   - WBC 16,900 on admission; no fever or other clear evidence of infectious process  - Started on empiric Rocephin and azithromycin in ED  - Continue abx empirically while checking PCT, looking for other causes for the new O2-requirement   3. Dehydration  - BUN:SCr ratio >30, dry mucous membranes, sinus tachycardia - At least partially explained by osmotic diuresis secondary to hyperglycemia with sugars in mid-300s - 2 L NS bolused in ED - Continue IV hydration with NS at 100 cc/hr  - Repeat chem panel in am   4. Type II DM with hyperglycemia - Managed with metformin and mealtime Novolog at home  - Hold metformin while in hospital  - Continue mealtime Novolog at reduced-dose, start a SSI correctional prn  - No A1c on file, ordered    5. Normocytic anemia - Hgb 11.2 on admission with normal MCV; Hgb was normal in 2014 (our most recent prior) - No sign of active blood loss  - Check iron studies, B12, folate; supplement prn   6. Hypertension - Running low at time of presentation, improved with IVF  - Managed with lisinopril at home, held for now given dehydration and low BP   - Resume lisinopril when appropriate   7. Hyponatremia - Sodium 134 on admission in the setting of dehydration  - Anticipate correction with IVF  - Repeat chem panel in am    DVT prophylaxis: sq Lovenox  Code Status: Full  Family Communication: Son at bedside  Disposition Plan: Observe on med/surg  Consults called: None   Admission status: Observation    Briscoe Deutscher, MD [facility] Pager [phone]  If 7PM-7AM, please contact night-coverage [email or web address] Password [email or web address]  03/05/2016, 6:00 PM

## 2016-03-05 NOTE — ED Notes (Signed)
Pt says at 0800 pt was sitting down throwing up and when she got up, her r leg was numb and has stayed numb since then.  Pt says was sitting down throwing up for " a while."

## 2016-03-05 NOTE — ED Provider Notes (Signed)
CSN: [id number]     Arrival date & time 03/05/16  1048 History  By signing my name below, I, Placido Sou, attest that this documentation has been prepared under the direction and in the presence of Lavera Guise, MD. Electronically Signed: Placido Sou, ED Scribe. 03/05/2016. 11:35 AM.   Chief Complaint  Patient presents with  . Dizziness   The history is provided by the patient and a relative. No language interpreter was used.    HPI Comments: Jill [name] is a 55 y.o. female with a PMHx of DM, HTN and HLD whose CBG was 334 in triage presents to the Emergency Department complaining of constant, moderate, cough x 1 day. While driving home from work yesterday was coughing and noticed traces of blood in her sputum. Upon waking this morning she was experiencing body aches, nausea and dizziness and went to the bathroom and vomited which was green with streak of blood. Now with productive cough, sore throat, rhinorrhea and chest congestion. Her n/v worsens when eating or drinking. Pt confirms her PMHx including DM w/ h/o of neuropathy. She reports a recent sick contact who was vomiting.  She denies diarrhea, abd pain, dysuria, polyuria, increased urinary frequency or current hemoptysis.   Past Medical History  Diagnosis Date  . Diabetes mellitus without complication (HCC)   . Hypertension   . Hyperlipidemia    Past Surgical History  Procedure Laterality Date  . Tubal ligation     No family history on file. Social History  Substance Use Topics  . Smoking status: Never Smoker   . Smokeless tobacco: None  . Alcohol Use: No   OB History    Gravida Para Term Preterm AB TAB SAB Ectopic Multiple Living   4 4 4             Review of Systems  HENT: Positive for congestion, rhinorrhea and sore throat.   Respiratory: Positive for cough.   Gastrointestinal: Positive for nausea and vomiting. Negative for abdominal pain and diarrhea.  Endocrine: Negative for polyuria.  Genitourinary:  Negative for dysuria and frequency.  Neurological: Positive for numbness.  All other systems reviewed and are negative.  Allergies  Review of patient's allergies indicates no known allergies.  Home Medications   Prior to Admission medications   Medication Sig Start Date End Date Taking? Authorizing Provider  insulin aspart (NOVOLOG) 100 UNIT/ML injection Inject 10 Units into the skin 3 (three) times daily with meals.   Yes Historical Provider, MD  lisinopril (PRINIVIL,ZESTRIL) 10 MG tablet Take 10 mg by mouth daily.   Yes Historical Provider, MD  metFORMIN (GLUMETZA) 1000 MG (MOD) 24 hr tablet Take 1,000 mg by mouth 2 (two) times daily with a meal.    Yes Historical Provider, MD  rosuvastatin (CRESTOR) 20 MG tablet Take 20 mg by mouth daily.   Yes Historical Provider, MD   BP 107/59 mmHg  Pulse 89  Temp(Src) 98.2 F (36.8 C) (Oral)  Resp 15  Ht 5\' 2"  (1.575 m)  Wt 181 lb (82.101 kg)  BMI 33.10 kg/m2  SpO2 92% Physical Exam Nursing note and vitals reviewed. Constitutional: Well developed, well nourished, non-toxic, and in no acute distress Head: Normocephalic and atraumatic.  Mouth/Throat: Oropharynx is clear. Mucous membranes dry.  Neck: Normal range of motion. Neck supple. No meningismus.  Cardiovascular: Normal rate and regular rhythm.   Pulmonary/Chest: Effort normal and breath sounds normal.  Abdominal: Soft. There is no tenderness. There is no rebound and no guarding.  Musculoskeletal: Normal  range of motion.  Neurological: Alert, no facial droop, fluent speech, moves all extremities symmetrically. No pronator drift. No dysmetria with finger to nose. Reported diminished sensation in stocking glove distribution of the RLE (reports baseline neuropathy).  Skin: Skin is warm and dry.  Psychiatric: Cooperative ED Course  Procedures  DIAGNOSTIC STUDIES: Oxygen Saturation is 97% on RA, normal by my interpretation.    COORDINATION OF CARE: 11:29 AM Discussed next steps with  pt. She verbalized understanding and is agreeable with the plan.   Labs Review Labs Reviewed  CBC WITH DIFFERENTIAL/PLATELET - Abnormal; Notable for the following:    WBC 16.9 (*)    Hemoglobin 11.2 (*)    HCT 35.3 (*)    Neutro Abs 14.8 (*)    All other components within normal limits  COMPREHENSIVE METABOLIC PANEL - Abnormal; Notable for the following:    Sodium 134 (*)    Glucose, Bld 355 (*)    BUN 29 (*)    Calcium 8.7 (*)    ALT 13 (*)    All other components within normal limits  URINALYSIS, ROUTINE W REFLEX MICROSCOPIC (NOT AT [facility]) - Abnormal; Notable for the following:    Glucose, UA >1000 (*)    Ketones, ur TRACE (*)    All other components within normal limits  URINE MICROSCOPIC-ADD ON - Abnormal; Notable for the following:    Squamous Epithelial / LPF 6-30 (*)    Bacteria, UA FEW (*)    All other components within normal limits  CBG MONITORING, ED - Abnormal; Notable for the following:    Glucose-Capillary 334 (*)    All other components within normal limits    Imaging Review Dg Chest 2 View  03/05/2016  CLINICAL DATA:  Productive cough. EXAM: CHEST  2 VIEW COMPARISON:  None. FINDINGS: The heart size and mediastinal contours are within normal limits. Both lungs are clear. The visualized skeletal structures are unremarkable. IMPRESSION: No active cardiopulmonary disease. Electronically Signed   By: Lupita Raider, M.D.   On: 03/05/2016 11:55   I have personally reviewed and evaluated these images and lab results as part of my medical decision-making.   EKG Interpretation   Date/Time:  Wednesday Mar 05 2016 12:01:30 EDT Ventricular Rate:  104 PR Interval:  153 QRS Duration: 85 QT Interval:  331 QTC Calculation: 435 R Axis:   66 Text Interpretation:  Sinus tachycardia Baseline wander in lead(s) III  Other than tachycardia, no significant changes Confirmed by Jaysun Wessels MD, Annabelle Harman  ([id number]) on 03/05/2016 2:10:18 PM      MDM   Final diagnoses:  CAP (community  acquired pneumonia)    55 year old female who presents with one day of cough, congestion, myalgias, and shortness of breath. On presentation, she is nontoxic in no acute distress. She is afebrile and hemodynamically stable. During ED course she does develop oxygen requirement, and hypoxia with ambulation to 85%. Lungs overall clear, and chest x-ray shows no acute cardiopulmonary processes, but clinically she seems to have potential pneumonia. Urinalysis without evidence of infection. She does have leukocytosis of 16. Given oxygen requirement, will admit for treatment of potential CAP.  I personally performed the services described in this documentation, which was scribed in my presence. The recorded information has been reviewed and is accurate.    Lavera Guise, MD 03/05/16 [phone]

## 2016-03-05 NOTE — ED Notes (Signed)
Pt reports yesterday pt would cough and noticed small amount of bright red blood in sputum.  Today pt vomited once and saw a small amount of bright red blood in emesis.  Reports headache and head feels hot.  Reports generalized weakness and dizziness.

## 2016-03-06 ENCOUNTER — Observation Stay (HOSPITAL_COMMUNITY): Payer: Self-pay

## 2016-03-06 DIAGNOSIS — J180 Bronchopneumonia, unspecified organism: Secondary | ICD-10-CM

## 2016-03-06 LAB — LACTIC ACID, PLASMA
LACTIC ACID, VENOUS: 0.6 mmol/L (ref 0.5–2.0)
LACTIC ACID, VENOUS: 0.8 mmol/L (ref 0.5–2.0)
LACTIC ACID, VENOUS: 1.3 mmol/L (ref 0.5–2.0)
Lactic Acid, Venous: 0.9 mmol/L (ref 0.5–2.0)

## 2016-03-06 LAB — CBC WITH DIFFERENTIAL/PLATELET
BASOS ABS: 0 10*3/uL (ref 0.0–0.1)
BASOS ABS: 0.1 10*3/uL (ref 0.0–0.1)
BASOS PCT: 0 %
BASOS PCT: 0 %
EOS ABS: 0.1 10*3/uL (ref 0.0–0.7)
EOS ABS: 0.2 10*3/uL (ref 0.0–0.7)
EOS PCT: 1 %
Eosinophils Relative: 1 %
HCT: 28.1 % — ABNORMAL LOW (ref 36.0–46.0)
HEMATOCRIT: 29.6 % — AB (ref 36.0–46.0)
HEMOGLOBIN: 9.5 g/dL — AB (ref 12.0–15.0)
Hemoglobin: 8.9 g/dL — ABNORMAL LOW (ref 12.0–15.0)
LYMPHS PCT: 23 %
Lymphocytes Relative: 16 %
Lymphs Abs: 2.6 10*3/uL (ref 0.7–4.0)
Lymphs Abs: 3.4 10*3/uL (ref 0.7–4.0)
MCH: 26.3 pg (ref 26.0–34.0)
MCH: 26.8 pg (ref 26.0–34.0)
MCHC: 31.7 g/dL (ref 30.0–36.0)
MCHC: 32.1 g/dL (ref 30.0–36.0)
MCV: 82.9 fL (ref 78.0–100.0)
MCV: 83.4 fL (ref 78.0–100.0)
MONO ABS: 1 10*3/uL (ref 0.1–1.0)
MONOS PCT: 7 %
Monocytes Absolute: 1.1 10*3/uL — ABNORMAL HIGH (ref 0.1–1.0)
Monocytes Relative: 7 %
NEUTROS ABS: 11.8 10*3/uL — AB (ref 1.7–7.7)
NEUTROS PCT: 76 %
Neutro Abs: 10.1 10*3/uL — ABNORMAL HIGH (ref 1.7–7.7)
Neutrophils Relative %: 69 %
Platelets: 220 10*3/uL (ref 150–400)
Platelets: 241 10*3/uL (ref 150–400)
RBC: 3.39 MIL/uL — AB (ref 3.87–5.11)
RBC: 3.55 MIL/uL — AB (ref 3.87–5.11)
RDW: 13.6 % (ref 11.5–15.5)
RDW: 13.8 % (ref 11.5–15.5)
WBC: 14.7 10*3/uL — AB (ref 4.0–10.5)
WBC: 15.6 10*3/uL — AB (ref 4.0–10.5)

## 2016-03-06 LAB — BLOOD GAS, ARTERIAL
Acid-base deficit: 4.2 mmol/L — ABNORMAL HIGH (ref 0.0–2.0)
Bicarbonate: 20.9 mEq/L (ref 20.0–24.0)
DRAWN BY: 27733
O2 CONTENT: 10 L/min
O2 SAT: 94.6 %
PATIENT TEMPERATURE: 37
PO2 ART: 74 mmHg — AB (ref 80.0–100.0)
pCO2 arterial: 37.1 mmHg (ref 35.0–45.0)
pH, Arterial: 7.357 (ref 7.350–7.450)

## 2016-03-06 LAB — GLUCOSE, CAPILLARY
GLUCOSE-CAPILLARY: 219 mg/dL — AB (ref 65–99)
GLUCOSE-CAPILLARY: 167 mg/dL — AB (ref 65–99)
Glucose-Capillary: 310 mg/dL — ABNORMAL HIGH (ref 65–99)
Glucose-Capillary: 195 mg/dL — ABNORMAL HIGH (ref 65–99)
Glucose-Capillary: 132 mg/dL — ABNORMAL HIGH (ref 65–99)

## 2016-03-06 LAB — IRON AND TIBC
Iron: 16 ug/dL — ABNORMAL LOW (ref 28–170)
Saturation Ratios: 6 % — ABNORMAL LOW (ref 10.4–31.8)
TIBC: 252 ug/dL (ref 250–450)
UIBC: 236 ug/dL

## 2016-03-06 LAB — BRAIN NATRIURETIC PEPTIDE: B Natriuretic Peptide: 175 pg/mL — ABNORMAL HIGH (ref 0.0–100.0)

## 2016-03-06 LAB — FERRITIN: Ferritin: 136 ng/mL (ref 11–307)

## 2016-03-06 LAB — EXPECTORATED SPUTUM ASSESSMENT W REFEX TO RESP CULTURE

## 2016-03-06 LAB — EXPECTORATED SPUTUM ASSESSMENT W GRAM STAIN, RFLX TO RESP C

## 2016-03-06 LAB — VITAMIN B12: VITAMIN B 12: 401 pg/mL (ref 180–914)

## 2016-03-06 MED ORDER — DEXTROSE 5 % IV SOLN
INTRAVENOUS | Status: AC
  Filled 2016-03-06: qty 2

## 2016-03-06 MED ORDER — INSULIN DETEMIR 100 UNIT/ML ~~LOC~~ SOLN
10.0000 [IU] | Freq: Every day | SUBCUTANEOUS | Status: DC
  Administered 2016-03-06 – 2016-03-10 (×5): 10 [IU] via SUBCUTANEOUS
  Filled 2016-03-06 (×6): qty 0.1

## 2016-03-06 MED ORDER — PNEUMOCOCCAL VAC POLYVALENT 25 MCG/0.5ML IJ INJ
0.5000 mL | INJECTION | INTRAMUSCULAR | Status: AC
  Administered 2016-03-07: 0.5 mL via INTRAMUSCULAR

## 2016-03-06 NOTE — Progress Notes (Signed)
Pt c/o headache, PRN tylenol given.  O2 Sats win mid 80s, Oxygen [place] up to 5L, respiratory called.  Will continue to monitor.

## 2016-03-06 NOTE — Progress Notes (Signed)
PROGRESS NOTE    Jill [name]  EXB:[id number] DOB: 1961-06-22 DOA: 03/05/2016 PCP: [facility]     Brief Narrative:  55 year old woman admitted on 5/17 with complaints of shortness of breath and having a new oxygen requirement. She was found and CT scan of the chest to have bronchopneumonia and has been admitted for treatment.   Assessment & Plan:   Principal Problem:   Acute respiratory failure with hypoxia (HCC) Active Problems:   CAP (community acquired pneumonia)   Insulin dependent diabetes mellitus (HCC)   Essential hypertension   Hyponatremia   Normocytic anemia   Hyperglycemia   Dehydration   Hypoxia   Leukocytosis   Bronchopneumonia   Acute hypoxemic respiratory failure -Due to community-acquired pneumonia, wean oxygen as tolerated.  Community-acquired pneumonia -Continue Rocephin/azithromycin. -Await culture data.  Leukocytosis -Due to pneumonia, improving.  Type 2 diabetes -Uncontrolled, start 10 units of long-acting insulin.    DVT prophylaxis: Lovenox Code Status: Full code Family Communication: 2 sons at bedside updated on plan of care and all questions answered Disposition Plan: Home when ready, anticipate 24-48 hours  Consultants:   None  Procedures:   None  Antimicrobials:   Rocephin  Azithromycin    Subjective: Feels very weak, anorexic  Objective: Filed Vitals:   03/06/16 0210 03/06/16 0649 03/06/16 0700 03/06/16 1013  BP:  91/62    Pulse:  104    Temp: 98.4 F (36.9 C) 99.8 F (37.7 C) 100.2 F (37.9 C) 99.8 F (37.7 C)  TempSrc: Oral Oral Oral Oral  Resp:  20    Height:      Weight:      SpO2:  94%      Intake/Output Summary (Last 24 hours) at 03/06/16 1336 Last data filed at 03/06/16 0900  Gross per 24 hour  Intake 658.33 ml  Output      0 ml  Net 658.33 ml   Filed Weights   03/05/16 1057 03/05/16 1826  Weight: 82.101 kg (181 lb) 86.864 kg (191 lb 8 oz)    Examination:  General exam:  Alert, awake, oriented x 3 Respiratory system: Coarse bilateral breath sounds Cardiovascular system:RRR. No murmurs, rubs, gallops. Gastrointestinal system: Abdomen is nondistended, soft and nontender. No organomegaly or masses felt. Normal bowel sounds heard. Central nervous system: Alert and oriented. No focal neurological deficits. Extremities: No C/C/E, +pedal pulses Skin: No rashes, lesions or ulcers Psychiatry: Judgement and insight appear normal. Mood & affect appropriate.     Data Reviewed: I have personally reviewed following labs and imaging studies  CBC:  Recent Labs Lab 03/05/16 1158 03/05/16 2350 03/06/16 0256  WBC 16.9* 15.6* 14.7*  NEUTROABS 14.8* 11.8* 10.1*  HGB 11.2* 9.5* 8.9*  HCT 35.3* 29.6* 28.1*  MCV 83.1 83.4 82.9  PLT 304 220 241   Basic Metabolic Panel:  Recent Labs Lab 03/05/16 1158  NA 134*  K 4.7  CL 101  CO2 25  GLUCOSE 355*  BUN 29*  CREATININE 0.93  CALCIUM 8.7*   GFR: Estimated Creatinine Clearance: 71.4 mL/min (by C-G formula based on Cr of 0.93). Liver Function Tests:  Recent Labs Lab 03/05/16 1158  AST 15  ALT 13*  ALKPHOS 68  BILITOT 0.4  PROT 7.7  ALBUMIN 3.7   No results for input(s): LIPASE, AMYLASE in the last 168 hours. No results for input(s): AMMONIA in the last 168 hours. Coagulation Profile: No results for input(s): INR, PROTIME in the last 168 hours. Cardiac Enzymes: No results for input(s):  CKTOTAL, CKMB, CKMBINDEX, TROPONINI in the last 168 hours. BNP (last 3 results) No results for input(s): PROBNP in the last 8760 hours. HbA1C: No results for input(s): HGBA1C in the last 72 hours. CBG:  Recent Labs Lab 03/05/16 1100 03/05/16 1824 03/05/16 2251 03/06/16 0745 03/06/16 1134  GLUCAP 334* 310* 258* 219* 195*   Lipid Profile: No results for input(s): CHOL, HDL, LDLCALC, TRIG, CHOLHDL, LDLDIRECT in the last 72 hours. Thyroid Function Tests: No results for input(s): TSH, T4TOTAL, FREET4, T3FREE,  THYROIDAB in the last 72 hours. Anemia Panel:  Recent Labs  03/05/16 2350  VITAMINB12 401  FERRITIN 136  TIBC 252  IRON 16*   Urine analysis:    Component Value Date/Time   COLORURINE YELLOW 03/05/2016 1320   APPEARANCEUR CLEAR 03/05/2016 1320   LABSPEC 1.010 03/05/2016 1320   PHURINE 5.5 03/05/2016 1320   GLUCOSEU >1000* 03/05/2016 1320   HGBUR NEGATIVE 03/05/2016 1320   BILIRUBINUR NEGATIVE 03/05/2016 1320   KETONESUR TRACE* 03/05/2016 1320   PROTEINUR NEGATIVE 03/05/2016 1320   UROBILINOGEN 0.2 05/20/2013 0943   NITRITE NEGATIVE 03/05/2016 1320   LEUKOCYTESUR NEGATIVE 03/05/2016 1320   Sepsis Labs: (procalcitonin:4,lacticidven:4)  ) Recent Results (from the past 240 hour(s))  Culture, blood (routine x 2)     Status: None (Preliminary result)   Collection Time: 03/05/16 11:45 PM  Result Value Ref Range Status   Specimen Description BLOOD RIGHT HAND  Final   Special Requests   Final    BOTTLES DRAWN AEROBIC AND ANAEROBIC AEB 5CC ANA 2CC   Culture NO GROWTH < 12 HOURS  Final   Report Status PENDING  Incomplete  Culture, blood (routine x 2)     Status: None (Preliminary result)   Collection Time: 03/05/16 11:50 PM  Result Value Ref Range Status   Specimen Description BLOOD RIGHT HAND  Final   Special Requests BOTTLES DRAWN AEROBIC ONLY 4CC  Final   Culture NO GROWTH < 12 HOURS  Final   Report Status PENDING  Incomplete         Radiology Studies: Dg Chest 2 View  03/05/2016  CLINICAL DATA:  Productive cough. EXAM: CHEST  2 VIEW COMPARISON:  None. FINDINGS: The heart size and mediastinal contours are within normal limits. Both lungs are clear. The visualized skeletal structures are unremarkable. IMPRESSION: No active cardiopulmonary disease. Electronically Signed   By: Lupita Raider, M.D.   On: 03/05/2016 11:55   Ct Angio Chest Pe W/cm &/or Wo Cm  03/05/2016  CLINICAL DATA:  55 year old female with acute respiratory failure. EXAM: CT ANGIOGRAPHY  CHEST WITH CONTRAST TECHNIQUE: Multidetector CT imaging of the chest was performed using the standard protocol during bolus administration of intravenous contrast. Multiplanar CT image reconstructions and MIPs were obtained to evaluate the vascular anatomy. CONTRAST:  100 cc Omnipaque 300 COMPARISON:  Chest radiograph dated 03/05/16 FINDINGS: There are bibasilar linear densities compatible with atelectasis/scarring. There is apparent thickening of the walls of the bronchi predominantly involving the lower lobes. There are areas of intraluminal densities within the distal bronchioles of the lung bases likely mucous secretions. Findings are concerning for bronchopneumonia. There is no pleural effusion or pneumothorax. The central airways are patent. The thoracic aorta and the origins of the great vessels of the aortic arch appear patent. There is no CT evidence of pulmonary embolism. Top-normal cardiac size. No pericardial effusion. There bilateral hilar adenopathy. The esophagus is grossly unremarkable. There is no axillary adenopathy. The chest wall soft tissues appear unremarkable. There is  degenerative changes of the spine. No acute fracture. The visualized upper abdomen is unremarkable. Review of the MIP images confirms the above findings. IMPRESSION: No CT evidence of pulmonary embolism. Bibasilar airspace densities with apparent thickening of the distal bronchials most compatible with bronchopneumonia. Clinical correlation and follow-up recommended. Electronically Signed   By: Elgie Collard M.D.   On: 03/05/2016 23:56        Scheduled Meds: . azithromycin  500 mg Intravenous Q24H  . cefTRIAXone (ROCEPHIN)  IV  2 g Intravenous Q24H  . enoxaparin (LOVENOX) injection  40 mg Subcutaneous Q24H  . gabapentin  400 mg Oral Q8H  . insulin aspart  0-15 Units Subcutaneous TID WC  . insulin aspart  4 Units Subcutaneous TID WC  . [START ON 03/07/2016] pneumococcal 23 valent vaccine  0.5 mL Intramuscular  Tomorrow-1000   Continuous Infusions:       Time spent: 30 minutes. Greater than 50% of this time was spent in direct contact with the patient coordinating care.     Chaya Jan, MD [facility] Pager [phone]  If 7PM-7AM, please contact night-coverage [email or web address] Password [email or web address] 03/06/2016, 1:36 PM

## 2016-03-07 LAB — HIV ANTIBODY (ROUTINE TESTING W REFLEX): HIV Screen 4th Generation wRfx: NONREACTIVE

## 2016-03-07 LAB — GLUCOSE, CAPILLARY
GLUCOSE-CAPILLARY: 153 mg/dL — AB (ref 65–99)
GLUCOSE-CAPILLARY: 195 mg/dL — AB (ref 65–99)
GLUCOSE-CAPILLARY: 206 mg/dL — AB (ref 65–99)
Glucose-Capillary: 227 mg/dL — ABNORMAL HIGH (ref 65–99)

## 2016-03-07 LAB — HEMOGLOBIN A1C
Hgb A1c MFr Bld: 12.6 % — ABNORMAL HIGH (ref 4.8–5.6)
MEAN PLASMA GLUCOSE: 315 mg/dL

## 2016-03-07 LAB — STREP PNEUMONIAE URINARY ANTIGEN: Strep Pneumo Urinary Antigen: NEGATIVE

## 2016-03-07 LAB — BASIC METABOLIC PANEL
Anion gap: 5 (ref 5–15)
BUN: 21 mg/dL — ABNORMAL HIGH (ref 6–20)
CO2: 24 mmol/L (ref 22–32)
Calcium: 8.1 mg/dL — ABNORMAL LOW (ref 8.9–10.3)
Chloride: 107 mmol/L (ref 101–111)
Creatinine, Ser: 0.76 mg/dL (ref 0.44–1.00)
GFR calc Af Amer: >60 mL/min (ref >60)
GFR calc non Af Amer: >60 mL/min (ref >60)
Glucose, Bld: 163 mg/dL — ABNORMAL HIGH (ref 65–99)
Potassium: 4.1 mmol/L (ref 3.5–5.1)
Sodium: 136 mmol/L (ref 135–145)

## 2016-03-07 LAB — CBC
HCT: 30.1 % — ABNORMAL LOW (ref 36.0–46.0)
Hemoglobin: 9.7 g/dL — ABNORMAL LOW (ref 12.0–15.0)
MCH: 26.7 pg (ref 26.0–34.0)
MCHC: 32.2 g/dL (ref 30.0–36.0)
MCV: 82.9 fL (ref 78.0–100.0)
PLATELETS: 251 10*3/uL (ref 150–400)
RBC: 3.63 MIL/uL — AB (ref 3.87–5.11)
RDW: 13.9 % (ref 11.5–15.5)
WBC: 14.3 10*3/uL — AB (ref 4.0–10.5)

## 2016-03-07 LAB — FOLATE RBC
Folate, Hemolysate: 261.3 ng/mL
Folate, RBC: 961 ng/mL (ref >498)
HEMATOCRIT: 27.2 % — AB (ref 34.0–46.6)

## 2016-03-07 LAB — PROCALCITONIN: Procalcitonin: 0.1 ng/mL

## 2016-03-07 NOTE — Progress Notes (Signed)
Pharmacy Antibiotic Note  Jill [name] is a 55 y.o. female admitted on 03/05/2016 with pneumonia.  Pharmacy has been consulted for rocephin dosing.  Patient received rocephin 1g x1 at 1605 today.  Plan: Continue Rocephin 2g IV q24h  Continue Zithromax 500mg  IV q24hrs, switch to PO when improved Monitor labs, progress, c/s  Height: 5\' 3"  (160 cm) Weight: 191 lb 8 oz (86.864 kg) IBW/kg (Calculated) : 52.4  Temp (24hrs), Avg:99.7 F (37.6 C), Min:98.5 F (36.9 C), Max:101.5 F (38.6 C)   Recent Labs Lab 03/05/16 1158 03/05/16 2350 03/06/16 0256 03/06/16 0632 03/06/16 0859 03/07/16 0624  WBC 16.9* 15.6* 14.7*  --   --  14.3*  CREATININE 0.93  --   --   --   --  0.76  LATICACIDVEN  --  1.3 0.6 0.8 0.9  --     Estimated Creatinine Clearance: 83 mL/min (by C-G formula based on Cr of 0.76).    No Known Allergies  Thank you for allowing pharmacy to be a part of this patient's care.  Valrie Hart A 03/07/2016 10:14 AM

## 2016-03-07 NOTE — Progress Notes (Signed)
PROGRESS NOTE    Jill [name]  ZOX:[id number] DOB: [birth year] DOA: 03/05/2016 PCP: [facility]     Brief Narrative:  55 year old woman admitted on 5/17 with complaints of shortness of breath and having a new oxygen requirement. She was found and CT scan of the chest to have bronchopneumonia and has been admitted for treatment.   Assessment & Plan:   Principal Problem:   Acute respiratory failure with hypoxia (HCC) Active Problems:   CAP (community acquired pneumonia)   Insulin dependent diabetes mellitus (HCC)   Essential hypertension   Hyponatremia   Normocytic anemia   Hyperglycemia   Dehydration   Hypoxia   Leukocytosis   Bronchopneumonia   Acute hypoxemic respiratory failure -Due to community-acquired pneumonia, wean oxygen as tolerated.  Community-acquired pneumonia -Continue Rocephin/azithromycin. -Await culture data.  Leukocytosis -Due to pneumonia, improving.  Type 2 diabetes -Improved control, monitor and 10 units of long-acting insulin and adjust as needed.    DVT prophylaxis: Lovenox Code Status: Full code Family Communication: son at bedside updated on plan of care and all questions answered Disposition Plan: Home when ready, anticipate 24-48 hours  Consultants:   None  Procedures:   None  Antimicrobials:   Rocephin  Azithromycin    Subjective: Feels very weak, anorexic  Objective: Filed Vitals:   03/06/16 2030 03/06/16 2100 03/07/16 0507 03/07/16 1508  BP:  106/46 108/57 102/62  Pulse:  108 102 101  Temp:  99.2 F (37.3 C) 98.5 F (36.9 C) 98.6 F (37 C)  TempSrc:  Oral Oral Oral  Resp:  21 20 19   Height:      Weight:      SpO2: 93% 94% 91% 93%    Intake/Output Summary (Last 24 hours) at 03/07/16 1640 Last data filed at 03/07/16 0900  Gross per 24 hour  Intake    490 ml  Output      0 ml  Net    490 ml   Filed Weights   03/05/16 1057 03/05/16 1826  Weight: 82.101 kg (181 lb) 86.864 kg (191 lb 8 oz)      Examination:  General exam: Alert, awake, oriented x 3 Respiratory system: Coarse bilateral breath sounds Cardiovascular system:RRR. No murmurs, rubs, gallops. Gastrointestinal system: Abdomen is nondistended, soft and nontender. No organomegaly or masses felt. Normal bowel sounds heard. Central nervous system: Alert and oriented. No focal neurological deficits. Extremities: No C/C/E, +pedal pulses Skin: No rashes, lesions or ulcers Psychiatry: Judgement and insight appear normal. Mood & affect appropriate.     Data Reviewed: I have personally reviewed following labs and imaging studies  CBC:  Recent Labs Lab 03/05/16 1158 03/05/16 2350 03/06/16 0256 03/07/16 0624  WBC 16.9* 15.6* 14.7* 14.3*  NEUTROABS 14.8* 11.8* 10.1*  --   HGB 11.2* 9.5* 8.9* 9.7*  HCT 35.3* 29.6* 28.1*  27.2* 30.1*  MCV 83.1 83.4 82.9 82.9  PLT 304 220 241 251   Basic Metabolic Panel:  Recent Labs Lab 03/05/16 1158 03/07/16 0624  NA 134* 136  K 4.7 4.1  CL 101 107  CO2 25 24  GLUCOSE 355* 163*  BUN 29* 21*  CREATININE 0.93 0.76  CALCIUM 8.7* 8.1*   GFR: Estimated Creatinine Clearance: 83 mL/min (by C-G formula based on Cr of 0.76). Liver Function Tests:  Recent Labs Lab 03/05/16 1158  AST 15  ALT 13*  ALKPHOS 68  BILITOT 0.4  PROT 7.7  ALBUMIN 3.7   No results for input(s): LIPASE, AMYLASE in the last  168 hours. No results for input(s): AMMONIA in the last 168 hours. Coagulation Profile: No results for input(s): INR, PROTIME in the last 168 hours. Cardiac Enzymes: No results for input(s): CKTOTAL, CKMB, CKMBINDEX, TROPONINI in the last 168 hours. BNP (last 3 results) No results for input(s): PROBNP in the last 8760 hours. HbA1C:  Recent Labs  03/05/16 1758  HGBA1C 12.6*   CBG:  Recent Labs Lab 03/06/16 1134 03/06/16 1603 03/06/16 2033 03/07/16 0727 03/07/16 1148  GLUCAP 195* 132* 167* 153* 195*   Lipid Profile: No results for input(s): CHOL, HDL,  LDLCALC, TRIG, CHOLHDL, LDLDIRECT in the last 72 hours. Thyroid Function Tests: No results for input(s): TSH, T4TOTAL, FREET4, T3FREE, THYROIDAB in the last 72 hours. Anemia Panel:  Recent Labs  03/05/16 2350  VITAMINB12 401  FERRITIN 136  TIBC 252  IRON 16*   Urine analysis:    Component Value Date/Time   COLORURINE YELLOW 03/05/2016 1320   APPEARANCEUR CLEAR 03/05/2016 1320   LABSPEC 1.010 03/05/2016 1320   PHURINE 5.5 03/05/2016 1320   GLUCOSEU >1000* 03/05/2016 1320   HGBUR NEGATIVE 03/05/2016 1320   BILIRUBINUR NEGATIVE 03/05/2016 1320   KETONESUR TRACE* 03/05/2016 1320   PROTEINUR NEGATIVE 03/05/2016 1320   UROBILINOGEN 0.2 05/20/2013 0943   NITRITE NEGATIVE 03/05/2016 1320   LEUKOCYTESUR NEGATIVE 03/05/2016 1320   Sepsis Labs: (procalcitonin:4,lacticidven:4)  ) Recent Results (from the past 240 hour(s))  Culture, blood (routine x 2)     Status: None (Preliminary result)   Collection Time: 03/05/16 11:45 PM  Result Value Ref Range Status   Specimen Description BLOOD RIGHT HAND  Final   Special Requests   Final    BOTTLES DRAWN AEROBIC AND ANAEROBIC AEB=5CC ANA=2CC   Culture NO GROWTH 1 DAY  Final   Report Status PENDING  Incomplete  Culture, blood (routine x 2)     Status: None (Preliminary result)   Collection Time: 03/05/16 11:50 PM  Result Value Ref Range Status   Specimen Description BLOOD RIGHT HAND  Final   Special Requests BOTTLES DRAWN AEROBIC ONLY 4CC  Final   Culture NO GROWTH 1 DAY  Final   Report Status PENDING  Incomplete  Culture, sputum-assessment     Status: None   Collection Time: 03/06/16  5:00 PM  Result Value Ref Range Status   Specimen Description SPUTUM EXPECTORATED  Final   Special Requests NONE  Final   Sputum evaluation   Final    THIS SPECIMEN IS ACCEPTABLE. RESPIRATORY CULTURE REPORT TO FOLLOW. Performed at [facility]    Report Status 03/06/2016 FINAL  Final         Radiology Studies: Ct Angio Chest  Pe W/cm &/or Wo Cm  03/05/2016  CLINICAL DATA:  55 year old female with acute respiratory failure. EXAM: CT ANGIOGRAPHY CHEST WITH CONTRAST TECHNIQUE: Multidetector CT imaging of the chest was performed using the standard protocol during bolus administration of intravenous contrast. Multiplanar CT image reconstructions and MIPs were obtained to evaluate the vascular anatomy. CONTRAST:  100 cc Omnipaque 300 COMPARISON:  Chest radiograph dated 03/05/16 FINDINGS: There are bibasilar linear densities compatible with atelectasis/scarring. There is apparent thickening of the walls of the bronchi predominantly involving the lower lobes. There are areas of intraluminal densities within the distal bronchioles of the lung bases likely mucous secretions. Findings are concerning for bronchopneumonia. There is no pleural effusion or pneumothorax. The central airways are patent. The thoracic aorta and the origins of the great vessels of the aortic arch appear patent. There  is no CT evidence of pulmonary embolism. Top-normal cardiac size. No pericardial effusion. There bilateral hilar adenopathy. The esophagus is grossly unremarkable. There is no axillary adenopathy. The chest wall soft tissues appear unremarkable. There is degenerative changes of the spine. No acute fracture. The visualized upper abdomen is unremarkable. Review of the MIP images confirms the above findings. IMPRESSION: No CT evidence of pulmonary embolism. Bibasilar airspace densities with apparent thickening of the distal bronchials most compatible with bronchopneumonia. Clinical correlation and follow-up recommended. Electronically Signed   By: Elgie Collard M.D.   On: 03/05/2016 23:56   Dg Chest Port 1 View  03/06/2016  CLINICAL DATA:  Pneumonia which shortness of breath and weakness. EXAM: PORTABLE CHEST 1 VIEW COMPARISON:  CT of the chest 03/05/2016 FINDINGS: Cardiomediastinal silhouette is normal. Mediastinal contours appear intact. There is no  evidence of focal pneumothorax. The left hemidiaphragm is obscured, which may be seen with left lower lobe airspace consolidation or atelectasis. There are low lung volumes, with linear bibasilar opacities. Osseous structures are without acute abnormality. Soft tissues are grossly normal. IMPRESSION: Left lower lobe airspace consolidation. Low lung volumes with bibasilar atelectasis. Electronically Signed   By: Ted Mcalpine M.D.   On: 03/06/2016 18:17        Scheduled Meds: . azithromycin  500 mg Intravenous Q24H  . cefTRIAXone (ROCEPHIN)  IV  2 g Intravenous Q24H  . enoxaparin (LOVENOX) injection  40 mg Subcutaneous Q24H  . gabapentin  400 mg Oral Q8H  . insulin aspart  0-15 Units Subcutaneous TID WC  . insulin aspart  4 Units Subcutaneous TID WC  . insulin detemir  10 Units Subcutaneous QHS   Continuous Infusions:    LOS: 0 days    Time spent: 30 minutes. Greater than 50% of this time was spent in direct contact with the patient coordinating care.     Chaya Jan, MD [facility] Pager [phone]  If 7PM-7AM, please contact night-coverage [email or web address] Password [email or web address] 03/07/2016, 4:40 PM

## 2016-03-08 DIAGNOSIS — E860 Dehydration: Secondary | ICD-10-CM

## 2016-03-08 DIAGNOSIS — I10 Essential (primary) hypertension: Secondary | ICD-10-CM

## 2016-03-08 DIAGNOSIS — J9601 Acute respiratory failure with hypoxia: Secondary | ICD-10-CM

## 2016-03-08 DIAGNOSIS — J180 Bronchopneumonia, unspecified organism: Principal | ICD-10-CM

## 2016-03-08 DIAGNOSIS — J189 Pneumonia, unspecified organism: Secondary | ICD-10-CM

## 2016-03-08 LAB — GLUCOSE, CAPILLARY
GLUCOSE-CAPILLARY: 166 mg/dL — AB (ref 65–99)
GLUCOSE-CAPILLARY: 278 mg/dL — AB (ref 65–99)
Glucose-Capillary: 164 mg/dL — ABNORMAL HIGH (ref 65–99)
Glucose-Capillary: 273 mg/dL — ABNORMAL HIGH (ref 65–99)

## 2016-03-08 LAB — LEGIONELLA PNEUMOPHILA SEROGP 1 UR AG: L. pneumophila Serogp 1 Ur Ag: NEGATIVE

## 2016-03-08 NOTE — Progress Notes (Signed)
[facility] PROGRESS NOTE  Jill [name] ZOX:[id number] DOB: [birth year]    PCP:   Jill [name] [facility]   HPI: Jill [name] is an 55 y.o. female with hx of DM, HTN, HLD, complained of feeling fatigue for a year, suspicious for having depression, admitted for bronchoPNA and has been on IV Rocephin and IV Zithromax. Jill [name] was found to have anemia as well, without clinical evidence of bleeding, and never had colonoscopy.   Rewiew of Systems:  Constitutional: Negative for malaise, fever and chills. No significant weight loss or weight gain Eyes: Negative for eye pain, redness and discharge, diplopia, visual changes, or flashes of light. ENMT: Negative for ear pain, hoarseness, nasal congestion, sinus pressure and sore throat. No headaches; tinnitus, drooling, or problem swallowing. Cardiovascular: Negative for chest pain, palpitations, diaphoresis, dyspnea and peripheral edema. ; No orthopnea, PND Respiratory: Negative for cough, hemoptysis, wheezing and stridor. No pleuritic chestpain. Gastrointestinal: Negative for nausea, vomiting, diarrhea, constipation, abdominal pain, melena, blood in stool, hematemesis, jaundice and rectal bleeding.    Genitourinary: Negative for frequency, dysuria, incontinence,flank pain and hematuria; Musculoskeletal: Negative for back pain and neck pain. Negative for swelling and trauma.;  Skin: . Negative for pruritus, rash, abrasions, bruising and skin lesion.; ulcerations Neuro: Negative for headache, lightheadedness and neck stiffness. Negative for weakness, altered level of consciousness , altered mental status, extremity weakness, burning feet, involuntary movement, seizure and syncope.  Psych: negative for anxiety, depression, insomnia, tearfulness, panic attacks, hallucinations, paranoia, suicidal or homicidal ideation    Past Medical History  Diagnosis Date  . Diabetes mellitus without complication (HCC)   . Hypertension   . Hyperlipidemia      Past Surgical History  Procedure Laterality Date  . Tubal ligation      Medications:  HOME MEDS: Prior to Admission medications   Medication Sig Start Date End Date Taking? Authorizing Provider  aspirin EC 81 MG tablet Take 81 mg by mouth daily.   Yes Historical Provider, MD  gabapentin (NEURONTIN) 400 MG capsule Take 400 mg by mouth 3 (three) times daily.   Yes Historical Provider, MD  insulin aspart (NOVOLOG) 100 UNIT/ML injection Inject 10 Units into the skin 3 (three) times daily with meals.   Yes Historical Provider, MD  lisinopril (PRINIVIL,ZESTRIL) 10 MG tablet Take 10 mg by mouth daily.   Yes Historical Provider, MD  rosuvastatin (CRESTOR) 20 MG tablet Take 20 mg by mouth daily.   Yes Historical Provider, MD  sitaGLIPtin-metformin (JANUMET) 50-1000 MG tablet Take 1 tablet by mouth 2 (two) times daily with a meal.   Yes Historical Provider, MD     Allergies:  No Known Allergies  Social History:   reports that Jill [name] has never smoked. Jill [name] does not have any smokeless tobacco history on file. Jill [name] reports that Jill [name] does not drink alcohol or use illicit drugs.  Family History: Family History  Problem Relation Age of Onset  . Diabetes type II Other   . Hypertension Other      Physical Exam: Filed Vitals:   03/07/16 2029 03/07/16 2127 03/08/16 0405 03/08/16 1301  BP:  99/44 103/63 101/52  Pulse:  96 79 100  Temp:  98.4 F (36.9 C) 99.3 F (37.4 C) 98.2 F (36.8 C)  TempSrc:  Oral Oral Oral  Resp:  20 20 20   Height:      Weight:      SpO2: 92% 91% 98% 97%   Blood pressure 101/52, pulse 100, temperature 98.2 F (36.8 C), temperature source Oral,  resp. rate 20, height  (1.6 m), weight 86.864 kg (191 lb 8 oz), SpO2 97 %.  GEN:  Pleasant  patient lying in the stretcher in no acute distress; cooperative with exam. PSYCH:  alert and oriented x4; does not appear anxious or depressed; affect is appropriate. HEENT: Mucous membranes pink and anicteric; PERRLA; EOM  intact; no cervical lymphadenopathy nor thyromegaly or carotid bruit; no JVD; There were no stridor. Neck is very supple. Breasts:: Not examined CHEST WALL: No tenderness CHEST: Normal respiration, clear to auscultation bilaterally.  HEART: Regular rate and rhythm.  There are no murmur, rub, or gallops.   BACK: No kyphosis or scoliosis; no CVA tenderness ABDOMEN: soft and non-tender; no masses, no organomegaly, normal abdominal bowel sounds; no pannus; no intertriginous candida. There is no rebound and no distention. Rectal Exam: Not done EXTREMITIES: No bone or joint deformity; age-appropriate arthropathy of the hands and knees; no edema; no ulcerations.  There is no calf tenderness. Genitalia: not examined PULSES: 2+ and symmetric SKIN: Normal hydration no rash or ulceration CNS: Cranial nerves 2-12 grossly intact no focal lateralizing neurologic deficit.  Speech is fluent; uvula elevated with phonation, facial symmetry and tongue midline. DTR are normal bilaterally, cerebella exam is intact, barbinski is negative and strengths are equaled bilaterally.  No sensory loss.   Labs on Admission:  Basic Metabolic Panel:  Recent Labs Lab 03/05/16 1158 03/07/16 0624  NA 134* 136  K 4.7 4.1  CL 101 107  CO2 25 24  GLUCOSE 355* 163*  BUN 29* 21*  CREATININE 0.93 0.76  CALCIUM 8.7* 8.1*   Liver Function Tests:  Recent Labs Lab 03/05/16 1158  AST 15  ALT 13*  ALKPHOS 68  BILITOT 0.4  PROT 7.7  ALBUMIN 3.7   CBC:  Recent Labs Lab 03/05/16 1158 03/05/16 2350 03/06/16 0256 03/07/16 0624  WBC 16.9* 15.6* 14.7* 14.3*  NEUTROABS 14.8* 11.8* 10.1*  --   HGB 11.2* 9.5* 8.9* 9.7*  HCT 35.3* 29.6* 28.1*  27.2* 30.1*  MCV 83.1 83.4 82.9 82.9  PLT 304 220 241 251    CBG:  Recent Labs Lab 03/07/16 1148 03/07/16 1642 03/07/16 2125 03/08/16 0741 03/08/16 1201  GLUCAP 195* 227* 206* 164* 166*     Radiological Exams on Admission: Dg Chest Port 1 View  03/06/2016   CLINICAL DATA:  Pneumonia which shortness of breath and weakness. EXAM: PORTABLE CHEST 1 VIEW COMPARISON:  CT of the chest 03/05/2016 FINDINGS: Cardiomediastinal silhouette is normal. Mediastinal contours appear intact. There is no evidence of focal pneumothorax. The left hemidiaphragm is obscured, which may be seen with left lower lobe airspace consolidation or atelectasis. There are low lung volumes, with linear bibasilar opacities. Osseous structures are without acute abnormality. Soft tissues are grossly normal. IMPRESSION: Left lower lobe airspace consolidation. Low lung volumes with bibasilar atelectasis. Electronically Signed   By: Ted Mcalpine M.D.   On: 03/06/2016 18:17    Assessment/Plan Present on Admission:  . CAP (community acquired pneumonia) . Essential hypertension . Hyponatremia . Normocytic anemia . Hyperglycemia . Dehydration . Hypoxia . Leukocytosis . Acute respiratory failure with hypoxia (HCC)  PLAN:  CAP:  Will continue with IV Rocephin/Zithromax.  Anemia:  Jill [name] was recommended to have colonsocopy outpatient when Jill [name] is better.  DM: Stable.  Continue with Tx.  Metformin may be causing her to lose weight.   Other plans as per orders. Code Status:FULL CODE   Houston Siren, MD.  FACP [facility] Pager [phone] 7pm to 7am.  03/08/2016, 2:15 PM

## 2016-03-09 LAB — GLUCOSE, CAPILLARY
GLUCOSE-CAPILLARY: 189 mg/dL — AB (ref 65–99)
GLUCOSE-CAPILLARY: 171 mg/dL — AB (ref 65–99)
GLUCOSE-CAPILLARY: 176 mg/dL — AB (ref 65–99)
Glucose-Capillary: 132 mg/dL — ABNORMAL HIGH (ref 65–99)

## 2016-03-09 LAB — CULTURE, RESPIRATORY W GRAM STAIN: Culture: NORMAL

## 2016-03-09 LAB — CULTURE, RESPIRATORY

## 2016-03-09 LAB — PROCALCITONIN

## 2016-03-09 NOTE — Progress Notes (Signed)
Weaning pt. Down on O2. Tolerating well. Currently on 2L/min via nasal cannula.

## 2016-03-09 NOTE — Progress Notes (Signed)
PROGRESS NOTE    Jill [name]  ZOX:[id number] DOB: Aug 25, 1961 DOA: 03/05/2016 PCP: [facility]     Brief Narrative:  55 year old woman admitted on 5/17 with complaints of shortness of breath and having a new oxygen requirement. She was found and CT scan of the chest to have bronchopneumonia and has been admitted for treatment.   Assessment & Plan:   Principal Problem:   Acute respiratory failure with hypoxia (HCC) Active Problems:   CAP (community acquired pneumonia)   Insulin dependent diabetes mellitus (HCC)   Essential hypertension   Hyponatremia   Normocytic anemia   Hyperglycemia   Dehydration   Hypoxia   Leukocytosis   Bronchopneumonia   Acute hypoxemic respiratory failure -Due to community-acquired pneumonia, wean oxygen as tolerated. -Oxygen requirements have decreased, continue to remain as tolerated, hoping for discharge home  Community-acquired pneumonia -Continue Rocephin/azithromycin. -Cultures remain negative at day 3.  Leukocytosis -Due to pneumonia, improving.  Type 2 diabetes -Improved control, continue to adjust insulin as needed.    DVT prophylaxis: Lovenox Code Status: Full code Family Communication: son at bedside updated on plan of care and all questions answered Disposition Plan: Home when ready, anticipate 24-48 hours  Consultants:   None  Procedures:   None  Antimicrobials:   Rocephin  Azithromycin    Subjective: Feels very weak, anorexic  Objective: Filed Vitals:   03/09/16 0520 03/09/16 1319 03/09/16 1548 03/09/16 1806  BP: 122/64 116/71    Pulse: 94 78    Temp: 99.4 F (37.4 C) 98.4 F (36.9 C)    TempSrc: Oral Oral    Resp: 20 20    Height:      Weight:      SpO2: 93% 99% 98% 93%    Intake/Output Summary (Last 24 hours) at 03/09/16 1913 Last data filed at 03/09/16 1800  Gross per 24 hour  Intake    480 ml  Output      0 ml  Net    480 ml   Filed Weights   03/05/16 1057 03/05/16 1826    Weight: 82.101 kg (181 lb) 86.864 kg (191 lb 8 oz)    Examination:  General exam: Alert, awake, oriented x 3 Respiratory system: Coarse bilateral breath sounds Cardiovascular system:RRR. No murmurs, rubs, gallops. Gastrointestinal system: Abdomen is nondistended, soft and nontender. No organomegaly or masses felt. Normal bowel sounds heard. Central nervous system: Alert and oriented. No focal neurological deficits. Extremities: No C/C/E, +pedal pulses Skin: No rashes, lesions or ulcers Psychiatry: Judgement and insight appear normal. Mood & affect appropriate.     Data Reviewed: I have personally reviewed following labs and imaging studies  CBC:  Recent Labs Lab 03/05/16 1158 03/05/16 2350 03/06/16 0256 03/07/16 0624  WBC 16.9* 15.6* 14.7* 14.3*  NEUTROABS 14.8* 11.8* 10.1*  --   HGB 11.2* 9.5* 8.9* 9.7*  HCT 35.3* 29.6* 28.1*  27.2* 30.1*  MCV 83.1 83.4 82.9 82.9  PLT 304 220 241 251   Basic Metabolic Panel:  Recent Labs Lab 03/05/16 1158 03/07/16 0624  NA 134* 136  K 4.7 4.1  CL 101 107  CO2 25 24  GLUCOSE 355* 163*  BUN 29* 21*  CREATININE 0.93 0.76  CALCIUM 8.7* 8.1*   GFR: Estimated Creatinine Clearance: 83 mL/min (by C-G formula based on Cr of 0.76). Liver Function Tests:  Recent Labs Lab 03/05/16 1158  AST 15  ALT 13*  ALKPHOS 68  BILITOT 0.4  PROT 7.7  ALBUMIN 3.7   No  results for input(s): LIPASE, AMYLASE in the last 168 hours. No results for input(s): AMMONIA in the last 168 hours. Coagulation Profile: No results for input(s): INR, PROTIME in the last 168 hours. Cardiac Enzymes: No results for input(s): CKTOTAL, CKMB, CKMBINDEX, TROPONINI in the last 168 hours. BNP (last 3 results) No results for input(s): PROBNP in the last 8760 hours. HbA1C: No results for input(s): HGBA1C in the last 72 hours. CBG:  Recent Labs Lab 03/08/16 1703 03/08/16 2041 03/09/16 0737 03/09/16 1146 03/09/16 1628  GLUCAP 278* 273* 189* 132* 171*    Lipid Profile: No results for input(s): CHOL, HDL, LDLCALC, TRIG, CHOLHDL, LDLDIRECT in the last 72 hours. Thyroid Function Tests: No results for input(s): TSH, T4TOTAL, FREET4, T3FREE, THYROIDAB in the last 72 hours. Anemia Panel: No results for input(s): VITAMINB12, FOLATE, FERRITIN, TIBC, IRON, RETICCTPCT in the last 72 hours. Urine analysis:    Component Value Date/Time   COLORURINE YELLOW 03/05/2016 1320   APPEARANCEUR CLEAR 03/05/2016 1320   LABSPEC 1.010 03/05/2016 1320   PHURINE 5.5 03/05/2016 1320   GLUCOSEU >1000* 03/05/2016 1320   HGBUR NEGATIVE 03/05/2016 1320   BILIRUBINUR NEGATIVE 03/05/2016 1320   KETONESUR TRACE* 03/05/2016 1320   PROTEINUR NEGATIVE 03/05/2016 1320   UROBILINOGEN 0.2 05/20/2013 0943   NITRITE NEGATIVE 03/05/2016 1320   LEUKOCYTESUR NEGATIVE 03/05/2016 1320   Sepsis Labs: @LABRCNTIP (procalcitonin:4,lacticidven:4)  ) Recent Results (from the past 240 hour(s))  Culture, blood (routine x 2)     Status: None (Preliminary result)   Collection Time: 03/05/16 11:45 PM  Result Value Ref Range Status   Specimen Description BLOOD RIGHT HAND  Final   Special Requests   Final    BOTTLES DRAWN AEROBIC AND ANAEROBIC AEB=5CC ANA=2CC   Culture NO GROWTH 3 DAYS  Final   Report Status PENDING  Incomplete  Culture, blood (routine x 2)     Status: None (Preliminary result)   Collection Time: 03/05/16 11:50 PM  Result Value Ref Range Status   Specimen Description BLOOD RIGHT HAND  Final   Special Requests BOTTLES DRAWN AEROBIC ONLY 4CC  Final   Culture NO GROWTH 3 DAYS  Final   Report Status PENDING  Incomplete  Culture, sputum-assessment     Status: None   Collection Time: 03/06/16  5:00 PM  Result Value Ref Range Status   Specimen Description SPUTUM EXPECTORATED  Final   Special Requests NONE  Final   Sputum evaluation   Final    THIS SPECIMEN IS ACCEPTABLE. RESPIRATORY CULTURE REPORT TO FOLLOW. Performed at [facility]    Report Status  03/06/2016 FINAL  Final  Culture, respiratory (NON-Expectorated)     Status: None   Collection Time: 03/06/16  5:00 PM  Result Value Ref Range Status   Specimen Description SPUTUM EXPECTORATED  Final   Special Requests NONE  Final   Gram Stain   Final    ABUNDANT WBC PRESENT,BOTH PMN AND MONONUCLEAR MODERATE SQUAMOUS EPITHELIAL CELLS PRESENT MODERATE GRAM POSITIVE COCCI IN PAIRS FEW GRAM NEGATIVE RODS THIS SPECIMEN IS ACCEPTABLE FOR SPUTUM CULTURE Performed at [facility]    Culture   Final    NORMAL OROPHARYNGEAL FLORA Performed at [facility]    Report Status 03/09/2016 FINAL  Final         Radiology Studies: No results found.      Scheduled Meds: . azithromycin  500 mg Intravenous Q24H  . cefTRIAXone (ROCEPHIN)  IV  2 g Intravenous Q24H  . enoxaparin (LOVENOX) injection  40 mg Subcutaneous Q24H  . gabapentin  400 mg Oral Q8H  . insulin aspart  0-15 Units Subcutaneous TID WC  . insulin aspart  4 Units Subcutaneous TID WC  . insulin detemir  10 Units Subcutaneous QHS   Continuous Infusions:    LOS: 2 days    Time spent: 30 minutes. Greater than 50% of this time was spent in direct contact with the patient coordinating care.     Chaya Jan, MD [facility] Pager [phone]  If 7PM-7AM, please contact night-coverage [email or web address] Password [email or web address] 03/09/2016, 7:13 PM

## 2016-03-10 LAB — GLUCOSE, CAPILLARY
GLUCOSE-CAPILLARY: 219 mg/dL — AB (ref 65–99)
Glucose-Capillary: 132 mg/dL — ABNORMAL HIGH (ref 65–99)
Glucose-Capillary: 197 mg/dL — ABNORMAL HIGH (ref 65–99)
Glucose-Capillary: 171 mg/dL — ABNORMAL HIGH (ref 65–99)

## 2016-03-10 MED ORDER — AZITHROMYCIN 250 MG PO TABS: 500.0000 mg | ORAL_TABLET | Freq: Every day | ORAL | Status: DC

## 2016-03-10 MED ORDER — DEXTROSE 5 % IV SOLN
500.0000 mg | INTRAVENOUS | Status: DC
  Administered 2016-03-10: 500 mg via INTRAVENOUS
  Filled 2016-03-10 (×2): qty 500

## 2016-03-10 MED ORDER — DEXTROSE 5 % IV SOLN
INTRAVENOUS | Status: AC
  Filled 2016-03-10: qty 500

## 2016-03-10 NOTE — Progress Notes (Signed)
SATURATION QUALIFICATIONS: (This note is used to comply with regulatory documentation for home oxygen)  Patient Saturations on Room Air at Rest = 90 % Patient Saturations on Room Air while Ambulating = 88%  Patient Saturations on 0 Liters of oxygen while Ambulating = 88%  Please briefly explain why patient needs home oxygen:

## 2016-03-10 NOTE — Progress Notes (Signed)
Inpatient Diabetes Program Recommendations  AACE/ADA: New Consensus Statement on Inpatient Glycemic Control (2015)  Target Ranges:  Prepandial:   less than 140 mg/dL      Peak postprandial:   less than 180 mg/dL (1-2 hours)      Critically ill patients:  140 - 180 mg/dL   7/35/67 [id number] Discussed the implications of the elevated A1c.  Pt seems unclear of the type of insulin she is taking at home. Her son was there for assistance in translating.  Pt states that she takes one type of insulin (10 units) in the morning and at about 4pm and that she takes another type of insulin at bedtime.  She did not have the insulins with her, so I could not confirm the type.  She had her Janumet 50-1000 that was prescribed 1 tab bid. [facility]  Her son said that he was given information to fill out so his mother can see a diabetes specialist. At discharge, recommend that pt is clear about the type and timing of her home insulin regimen. Also recommend that pt has some follow-up scheduled to address her diabetes. Mellissa Kohut RD, CDE. M.Ed. Pager [phone] Inpatient Diabetes Coordinator

## 2016-03-10 NOTE — Progress Notes (Signed)
PROGRESS NOTE    Jill [name]  ZOX:[id number] DOB: 03/17/1961 DOA: 03/05/2016 PCP: [facility]     Brief Narrative:  55 year old woman admitted on 5/17 with complaints of shortness of breath and having a new oxygen requirement. She was found and CT scan of the chest to have bronchopneumonia and has been admitted for treatment. Discharge has been delayed due to significant hypoxemia with oxygen requirement.   Assessment & Plan:   Principal Problem:   Acute respiratory failure with hypoxia (HCC) Active Problems:   CAP (community acquired pneumonia)   Insulin dependent diabetes mellitus (HCC)   Essential hypertension   Hyponatremia   Normocytic anemia   Hyperglycemia   Dehydration   Hypoxia   Leukocytosis   Bronchopneumonia   Acute hypoxemic respiratory failure -Due to community-acquired pneumonia, wean oxygen as tolerated. -Oxygen requirements have decreased, continue to remain as tolerated, hoping for discharge home soon.  Community-acquired pneumonia -Continue Rocephin/azithromycin. -Cultures remain negative at day 4.  Leukocytosis -Due to pneumonia, improving.  Type 2 diabetes -Improved control, continue to adjust insulin as needed.    DVT prophylaxis: Lovenox Code Status: Full code Family Communication: son and husband at bedside updated on plan of care and all questions answered Disposition Plan: Home when ready, anticipate 24-48 hours  Consultants:   None  Procedures:   None  Antimicrobials:   Rocephin  Azithromycin    Subjective: Feels very weak, anorexic, altho improving.  Objective: Filed Vitals:   03/09/16 2050 03/09/16 2113 03/10/16 0508 03/10/16 1407  BP:  109/63 114/58 127/64  Pulse:  80 81 84  Temp:  98.6 F (37 C) 99.7 F (37.6 C) 97.7 F (36.5 C)  TempSrc:  Oral Oral Oral  Resp:  Height:      Weight:      SpO2: 96% 94% 93% 91%    Intake/Output Summary (Last 24 hours) at 03/10/16 1530 Last  data filed at 03/10/16 1300  Gross per 24 hour  Intake    720 ml  Output      0 ml  Net    720 ml   Filed Weights   03/05/16 1057 03/05/16 1826  Weight: 82.101 kg (181 lb) 86.864 kg (191 lb 8 oz)    Examination:  General exam: Alert, awake, oriented x 3 Respiratory system: Coarse bilateral breath sounds Cardiovascular system:RRR. No murmurs, rubs, gallops. Gastrointestinal system: Abdomen is nondistended, soft and nontender. No organomegaly or masses felt. Normal bowel sounds heard. Central nervous system: Alert and oriented. No focal neurological deficits. Extremities: No C/C/E, +pedal pulses Skin: No rashes, lesions or ulcers Psychiatry: Judgement and insight appear normal. Mood & affect appropriate.     Data Reviewed: I have personally reviewed following labs and imaging studies  CBC:  Recent Labs Lab 03/05/16 1158 03/05/16 2350 03/06/16 0256 03/07/16 0624  WBC 16.9* 15.6* 14.7* 14.3*  NEUTROABS 14.8* 11.8* 10.1*  --   HGB 11.2* 9.5* 8.9* 9.7*  HCT 35.3* 29.6* 28.1*  27.2* 30.1*  MCV 83.1 83.4 82.9 82.9  PLT 304 220 241 251   Basic Metabolic Panel:  Recent Labs Lab 03/05/16 1158 03/07/16 0624  NA 134* 136  K 4.7 4.1  CL 101 107  CO2 25 24  GLUCOSE 355* 163*  BUN 29* 21*  CREATININE 0.93 0.76  CALCIUM 8.7* 8.1*   GFR: Estimated Creatinine Clearance: 83 mL/min (by C-G formula based on Cr of 0.76). Liver Function Tests:  Recent Labs Lab 03/05/16 1158  AST 15  ALT 13*  ALKPHOS 68  BILITOT 0.4  PROT 7.7  ALBUMIN 3.7   No results for input(s): LIPASE, AMYLASE in the last 168 hours. No results for input(s): AMMONIA in the last 168 hours. Coagulation Profile: No results for input(s): INR, PROTIME in the last 168 hours. Cardiac Enzymes: No results for input(s): CKTOTAL, CKMB, CKMBINDEX, TROPONINI in the last 168 hours. BNP (last 3 results) No results for input(s): PROBNP in the last 8760 hours. HbA1C: No results for input(s): HGBA1C in the  last 72 hours. CBG:  Recent Labs Lab 03/09/16 1146 03/09/16 1628 03/09/16 2110 03/10/16 0738 03/10/16 1148  GLUCAP 132* 171* 176* 132* 197*   Lipid Profile: No results for input(s): CHOL, HDL, LDLCALC, TRIG, CHOLHDL, LDLDIRECT in the last 72 hours. Thyroid Function Tests: No results for input(s): TSH, T4TOTAL, FREET4, T3FREE, THYROIDAB in the last 72 hours. Anemia Panel: No results for input(s): VITAMINB12, FOLATE, FERRITIN, TIBC, IRON, RETICCTPCT in the last 72 hours. Urine analysis:    Component Value Date/Time   COLORURINE YELLOW 03/05/2016 1320   APPEARANCEUR CLEAR 03/05/2016 1320   LABSPEC 1.010 03/05/2016 1320   PHURINE 5.5 03/05/2016 1320   GLUCOSEU >1000* 03/05/2016 1320   HGBUR NEGATIVE 03/05/2016 1320   BILIRUBINUR NEGATIVE 03/05/2016 1320   KETONESUR TRACE* 03/05/2016 1320   PROTEINUR NEGATIVE 03/05/2016 1320   UROBILINOGEN 0.2 05/20/2013 0943   NITRITE NEGATIVE 03/05/2016 1320   LEUKOCYTESUR NEGATIVE 03/05/2016 1320   Sepsis Labs: @LABRCNTIP (procalcitonin:4,lacticidven:4)  ) Recent Results (from the past 240 hour(s))  Culture, blood (routine x 2)     Status: None (Preliminary result)   Collection Time: 03/05/16 11:45 PM  Result Value Ref Range Status   Specimen Description BLOOD RIGHT HAND  Final   Special Requests   Final    BOTTLES DRAWN AEROBIC AND ANAEROBIC AEB=5CC ANA=2CC   Culture NO GROWTH 4 DAYS  Final   Report Status PENDING  Incomplete  Culture, blood (routine x 2)     Status: None (Preliminary result)   Collection Time: 03/05/16 11:50 PM  Result Value Ref Range Status   Specimen Description BLOOD RIGHT HAND  Final   Special Requests BOTTLES DRAWN AEROBIC ONLY 4CC  Final   Culture NO GROWTH 4 DAYS  Final   Report Status PENDING  Incomplete  Culture, sputum-assessment     Status: None   Collection Time: 03/06/16  5:00 PM  Result Value Ref Range Status   Specimen Description SPUTUM EXPECTORATED  Final   Special Requests NONE  Final    Sputum evaluation   Final    THIS SPECIMEN IS ACCEPTABLE. RESPIRATORY CULTURE REPORT TO FOLLOW. Performed at [facility]    Report Status 03/06/2016 FINAL  Final  Culture, respiratory (NON-Expectorated)     Status: None   Collection Time: 03/06/16  5:00 PM  Result Value Ref Range Status   Specimen Description SPUTUM EXPECTORATED  Final   Special Requests NONE  Final   Gram Stain   Final    ABUNDANT WBC PRESENT,BOTH PMN AND MONONUCLEAR MODERATE SQUAMOUS EPITHELIAL CELLS PRESENT MODERATE GRAM POSITIVE COCCI IN PAIRS FEW GRAM NEGATIVE RODS THIS SPECIMEN IS ACCEPTABLE FOR SPUTUM CULTURE Performed at [facility]    Culture   Final    NORMAL OROPHARYNGEAL FLORA Performed at [facility]    Report Status 03/09/2016 FINAL  Final         Radiology Studies: No results found.      Scheduled Meds: . azithromycin  500  mg Intravenous Q24H  . cefTRIAXone (ROCEPHIN)  IV  2 g Intravenous Q24H  . enoxaparin (LOVENOX) injection  40 mg Subcutaneous Q24H  . gabapentin  400 mg Oral Q8H  . insulin aspart  0-15 Units Subcutaneous TID WC  . insulin aspart  4 Units Subcutaneous TID WC  . insulin detemir  10 Units Subcutaneous QHS   Continuous Infusions:    LOS: 3 days    Time spent: 25 minutes. Greater than 50% of this time was spent in direct contact with the patient coordinating care.     Chaya Jan, MD [facility] Pager [phone]  If 7PM-7AM, please contact night-coverage [email or web address] Password [email or web address] 03/10/2016, 3:30 PM

## 2016-03-11 LAB — GLUCOSE, CAPILLARY
GLUCOSE-CAPILLARY: 170 mg/dL — AB (ref 65–99)
Glucose-Capillary: 136 mg/dL — ABNORMAL HIGH (ref 65–99)

## 2016-03-11 LAB — CULTURE, BLOOD (ROUTINE X 2)
CULTURE: NO GROWTH
Culture: NO GROWTH

## 2016-03-11 MED ORDER — LEVOFLOXACIN 750 MG PO TABS: 750.0000 mg | ORAL_TABLET | Freq: Every day | ORAL | Status: DC

## 2016-03-11 NOTE — Progress Notes (Signed)
Inpatient Diabetes Program Recommendations  AACE/ADA: New Consensus Statement on Inpatient Glycemic Control (2015)  Target Ranges:  Prepandial:   less than 140 mg/dL      Peak postprandial:   less than 180 mg/dL (1-2 hours)      Critically ill patients:  140 - 180 mg/dL   Results for Jill [name], Jill [name] (MRN [id number]) as of 03/11/2016 08:56  Ref. Range 03/10/2016 07:38 03/10/2016 11:48 03/10/2016 17:11 03/10/2016 20:16 03/11/2016 07:40  Glucose-Capillary Latest Ref Range: 65-99 mg/dL 627 (H) 035 (H) 009 (H) 219 (H) 136 (H)   Review of Glycemic Control  Bedtime glucose was 219 mg/dl. Please consider adding HS scale (Novolog 0-5 units) while inpatient.  Thanks,  Christena Deem RN, MSN, [facility] Inpatient Diabetes Coordinator Team Pager [phone] (8a-5p)

## 2016-03-11 NOTE — Progress Notes (Signed)
SATURATION QUALIFICATIONS: (This note is used to comply with regulatory documentation for home oxygen)  Patient Saturations on Room Air at Rest =86%   

## 2016-03-11 NOTE — Discharge Summary (Signed)
Physician Discharge Summary  Arneta Mahmood ZOX:[id number] DOB: Oct 21, 1960 DOA: 03/05/2016  PCP: [facility]  Admit date: 03/05/2016 Discharge date: 03/11/2016  Time spent: 45 minutes  Recommendations for Outpatient Follow-up:  -We be discharged home today. -Advised to follow-up with primary care provider in 2 weeks at which time continued oxygen requirement will need to be determined.   Discharge Diagnoses:  Principal Problem:   Acute respiratory failure with hypoxia (HCC) Active Problems:   CAP (community acquired pneumonia)   Insulin dependent diabetes mellitus (HCC)   Essential hypertension   Hyponatremia   Normocytic anemia   Hyperglycemia   Dehydration   Hypoxia   Leukocytosis   Bronchopneumonia   Discharge Condition: Stable and improved  Filed Weights   03/05/16 1057 03/05/16 1826  Weight: 82.101 kg (181 lb) 86.864 kg (191 lb 8 oz)    History of present illness:  As per Dr. Antionette Char on 5/17: Jill [name] is a 55 y.o. 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 Patient reports pain in her usual state of health until approximately 2 months ago when she noted the insidious development of generalized weakness and dizziness, particularly upon waking. There has been no chest pain, palpitations, edema, or orthopnea over this interval. She endorses an occasional cough productive of sputum but denies any significant dyspnea. Up until yesterday, there is no nausea or vomiting associated with this. There is been no diarrhea. Patient denies any recent long distance travel. Symptoms began to worsen significantly over the past day, marked by increasing weakness and malaise. She reports seeing a  small streak of blood in her sputum yesterday and a fleck of blood in her emesis earlier today. She denies melena or hematochezia. She is not anticoagulated and does not take any antiplatelet.   ED Course: Upon arrival to the ED, patient is found to be afebrile, saturating low 90s on 2 L/m supplemental oxygen, tachycardic in the low 100s, and with blood pressure in the 90/55 range. EKG featured a sinus tachycardia with low voltage QRS. Chest x-ray is negative for acute cardiopulmonary disease. Urine was obtained for analysis and features greater than 1000 glucose and trace ketones. CMP is notable for a mild hyponatremia, WJX:BJYNWGNFAO ratio of >30, and glucose of 355. CBC features a leukocytosis of 16,900 and hemoglobin of 11.2 with normal MCV. A bolus of 2 L normal saline was administered in the emergency department and symptomatic care was provided with Zofran, gabapentin, and ibuprofen. Patient was started on empiric Rocephin and azithromycin for suspected CAP. Patient ambulated to the commode with her supplemental oxygen but desaturated to the 80s and was symptomatic. Tachycardia has resolved with IV fluids but the patient continues to require supplemental oxygen. She will be admitted to the hospital for ongoing evaluation and management of malaise, hyperglycemia, and new supplemental oxygen requirement.  Hospital Course:   Acute hypoxemic respiratory failure -Due to community-acquired pneumonia, wean oxygen as tolerated. -Oxygen requirements have decreased, but is still requiring oxygen.  Community-acquired pneumonia -All culture data is negative. -We'll discharge on 5 more days of Levaquin to complete 10 day course of antibiotics.  Leukocytosis -Due to pneumonia, improving.  Type 2 diabetes -Improved control, continue to adjust regimen in the outpatient setting as deemed necessary.  Procedures:  None   Consultations:  None  Discharge Instructions  Discharge Instructions    Diet  - low sodium heart healthy    Complete by:  As directed      Increase activity slowly    Complete by:  As directed             Medication List    STOP taking these medications        insulin aspart 100 UNIT/ML injection  Commonly known as:  novoLOG      TAKE these medications        aspirin EC 81 MG tablet  Take 81 mg by mouth daily.     gabapentin 400 MG capsule  Commonly known as:  NEURONTIN  Take 400 mg by mouth 3 (three) times daily.     levofloxacin 750 MG tablet  Commonly known as:  LEVAQUIN  Take 1 tablet (750 mg total) by mouth daily.     lisinopril 10 MG tablet  Commonly known as:  PRINIVIL,ZESTRIL  Take 10 mg by mouth daily.     rosuvastatin 20 MG tablet  Commonly known as:  CRESTOR  Take 20 mg by mouth daily.     sitaGLIPtin-metformin 50-1000 MG tablet  Commonly known as:  JANUMET  Take 1 tablet by mouth 2 (two) times daily with a meal.       No Known Allergies     Follow-up Information    Follow up with [facility]. Schedule an appointment as soon as possible for a visit in 2 weeks.   Contact information:   [address] [place] [address] [phone]        The results of significant diagnostics from this hospitalization (including imaging, microbiology, ancillary and laboratory) are listed below for reference.    Significant Diagnostic Studies: Dg Chest 2 View  03/05/2016  CLINICAL DATA:  Productive cough. EXAM: CHEST  2 VIEW COMPARISON:  None. FINDINGS: The heart size and mediastinal contours are within normal limits. Both lungs are clear. The visualized skeletal structures are unremarkable. IMPRESSION: No active cardiopulmonary disease. Electronically Signed   By: Lupita Raider, M.D.   On: 03/05/2016 11:55   Ct Angio Chest Pe W/cm &/or Wo Cm  03/05/2016  CLINICAL DATA:  55 year old female with acute respiratory failure. EXAM: CT ANGIOGRAPHY CHEST WITH CONTRAST TECHNIQUE: Multidetector CT imaging of the chest was performed  using the standard protocol during bolus administration of intravenous contrast. Multiplanar CT image reconstructions and MIPs were obtained to evaluate the vascular anatomy. CONTRAST:  100 cc Omnipaque 300 COMPARISON:  Chest radiograph dated 03/05/16 FINDINGS: There are bibasilar linear densities compatible with atelectasis/scarring. There is apparent thickening of the walls of the bronchi predominantly involving the lower lobes. There are areas of intraluminal densities within the distal bronchioles of the lung bases likely mucous secretions. Findings are concerning for bronchopneumonia. There is no pleural effusion or pneumothorax. The central airways are patent. The thoracic aorta and the origins of the great vessels of the aortic arch appear patent. There is no CT evidence of pulmonary embolism. Top-normal cardiac size. No pericardial effusion. There bilateral hilar adenopathy. The esophagus is grossly unremarkable. There is no axillary adenopathy. The chest wall soft tissues appear unremarkable. There is degenerative changes of the spine. No acute fracture. The visualized upper abdomen is unremarkable. Review of the MIP images confirms the above findings. IMPRESSION: No CT evidence of pulmonary embolism. Bibasilar airspace densities with apparent thickening of the distal bronchials most compatible with bronchopneumonia. Clinical correlation and  follow-up recommended. Electronically Signed   By: Elgie Collard M.D.   On: 03/05/2016 23:56   Dg Chest Port 1 View  03/06/2016  CLINICAL DATA:  Pneumonia which shortness of breath and weakness. EXAM: PORTABLE CHEST 1 VIEW COMPARISON:  CT of the chest 03/05/2016 FINDINGS: Cardiomediastinal silhouette is normal. Mediastinal contours appear intact. There is no evidence of focal pneumothorax. The left hemidiaphragm is obscured, which may be seen with left lower lobe airspace consolidation or atelectasis. There are low lung volumes, with linear bibasilar opacities.  Osseous structures are without acute abnormality. Soft tissues are grossly normal. IMPRESSION: Left lower lobe airspace consolidation. Low lung volumes with bibasilar atelectasis. Electronically Signed   By: Ted Mcalpine M.D.   On: 03/06/2016 18:17    Microbiology: Recent Results (from the past 240 hour(s))  Culture, blood (routine x 2)     Status: None   Collection Time: 03/05/16 11:45 PM  Result Value Ref Range Status   Specimen Description BLOOD RIGHT HAND  Final   Special Requests   Final    BOTTLES DRAWN AEROBIC AND ANAEROBIC AEB=5CC ANA=2CC   Culture NO GROWTH 5 DAYS  Final   Report Status 03/11/2016 FINAL  Final  Culture, blood (routine x 2)     Status: None   Collection Time: 03/05/16 11:50 PM  Result Value Ref Range Status   Specimen Description BLOOD RIGHT HAND  Final   Special Requests BOTTLES DRAWN AEROBIC ONLY 4CC  Final   Culture NO GROWTH 5 DAYS  Final   Report Status 03/11/2016 FINAL  Final  Culture, sputum-assessment     Status: None   Collection Time: 03/06/16  5:00 PM  Result Value Ref Range Status   Specimen Description SPUTUM EXPECTORATED  Final   Special Requests NONE  Final   Sputum evaluation   Final    THIS SPECIMEN IS ACCEPTABLE. RESPIRATORY CULTURE REPORT TO FOLLOW. Performed at [facility]    Report Status 03/06/2016 FINAL  Final  Culture, respiratory (NON-Expectorated)     Status: None   Collection Time: 03/06/16  5:00 PM  Result Value Ref Range Status   Specimen Description SPUTUM EXPECTORATED  Final   Special Requests NONE  Final   Gram Stain   Final    ABUNDANT WBC PRESENT,BOTH PMN AND MONONUCLEAR MODERATE SQUAMOUS EPITHELIAL CELLS PRESENT MODERATE GRAM POSITIVE COCCI IN PAIRS FEW GRAM NEGATIVE RODS THIS SPECIMEN IS ACCEPTABLE FOR SPUTUM CULTURE Performed at [facility]    Culture   Final    NORMAL OROPHARYNGEAL FLORA Performed at [facility]    Report Status 03/09/2016 FINAL  Final     Labs: Basic  Metabolic Panel:  Recent Labs Lab 03/05/16 1158 03/07/16 0624  NA 134* 136  K 4.7 4.1  CL 101 107  CO2 25 24  GLUCOSE 355* 163*  BUN 29* 21*  CREATININE 0.93 0.76  CALCIUM 8.7* 8.1*   Liver Function Tests:  Recent Labs Lab 03/05/16 1158  AST 15  ALT 13*  ALKPHOS 68  BILITOT 0.4  PROT 7.7  ALBUMIN 3.7   No results for input(s): LIPASE, AMYLASE in the last 168 hours. No results for input(s): AMMONIA in the last 168 hours. CBC:  Recent Labs Lab 03/05/16 1158 03/05/16 2350 03/06/16 0256 03/07/16 0624  WBC 16.9* 15.6* 14.7* 14.3*  NEUTROABS 14.8* 11.8* 10.1*  --   HGB 11.2* 9.5* 8.9* 9.7*  HCT 35.3* 29.6* 28.1*  27.2* 30.1*  MCV 83.1 83.4 82.9 82.9  PLT 304  220 241 251   Cardiac Enzymes: No results for input(s): CKTOTAL, CKMB, CKMBINDEX, TROPONINI in the last 168 hours. BNP: BNP (last 3 results)  Recent Labs  03/05/16 2350  BNP 175.0*    ProBNP (last 3 results) No results for input(s): PROBNP in the last 8760 hours.  CBG:  Recent Labs Lab 03/10/16 0738 03/10/16 1148 03/10/16 1711 03/10/16 2016 03/11/16 0740  GLUCAP 132* 197* 171* 219* 136*       Signed:  HERNANDEZ ACOSTA,ESTELA  [facility] Pager: [phone] 03/11/2016, 11:11 AM

## 2016-03-11 NOTE — Care Management Note (Signed)
Case Management Note  Patient Details  Name: Jill [name] MRN: [id number] Date of Birth: 1961/09/04                   Pt is from home, lives with family and is ind with ADL's. Pt employed but uninsured. Prior to admission pt received PCP care from [facility] health dept. Pt with DM and was no previously seeing endocrinologist. Pt is ind with ADL's. Pt has no DME PTA. Pt seen and assessed by [facility]. Referral will be made to Dr. Fransico Him for DM control. Pt will need home O2. Pt will need another source of f/u for home O2 so has been made f/u appointment with the [facility]. Pt plans for this to be temporary and still requests we fax pt DC summary to the [facility] health dept so they will be up-to-date when she no longer needs oxygen and will return to [facility] Dept. Home O2 referral given to [facility]. Pt may qualify for charity, if not, pt's son will pay OOP for her to have O2. MD anticipate's O2 need will be temporary. Pt discharging today. Port O2 tanks have been delivered to pt room. Pt and son are aware of and agreeable to DC plan.    Expected Discharge Date:  03/11/16               Expected Discharge Plan:  Home/Self Care  In-House Referral:  Financial Counselor  Discharge planning Services  CM Consult, Follow-up appt scheduled, [facility]  Post Acute Care Choice:  Home Health Choice offered to:  Patient  DME Arranged:  Oxygen DME Agency:  [facility]  HH Arranged:    [facility] Agency:     Status of Service:  Completed, signed off  Medicare Important Message Given:    Date Medicare IM Given:    Medicare IM give by:    Date Additional Medicare IM Given:    Additional Medicare Important Message give by:     If discussed at Long Length of Stay Meetings, dates discussed:  03/11/2016  Additional Comments:  Malcolm Metro, RN 03/11/2016, 1:44 PM

## 2016-03-11 NOTE — Progress Notes (Signed)
Patient Information    Patient Name Sex Jill Meo   [name], Jill [name] ([id number]) Female 05/14/1961     Room Bed   A307 A307-01    Patient Demographics    Address Phone   [address] [place] [address] [phone] (Home) [phone] (Mobile)    Patient Ethnicity & Race    Ethnic Group Patient Race   Hispanic or Latino Other or two or more races    Emergency Contact(s)    Name Relation Home Work Mobile   [name] [phone]  [phone]   [name], [name] [phone]  [phone]   [name] [phone]  [phone]    Documents on File      Status Date Received Description   Documents for the Patient   Driver's License Not Received     [place] HIPAA NOTICE OF PRIVACY - Scanned Not Received     [place] E-Signature HIPAA Notice of Privacy Received 06/12/11    [place] E-Signature HIPAA Notice of Privacy Spanish Not Received     Insurance Card Not Received     Advance Directives/Living Will/HCPOA/POA Not Received     Financial Application Not Received     HIM ROI Authorization Not Received  05/19/13 - ER visit of July 2014, notes, test results, exam notes, labs. Patient here now.    Release of Information  05/20/13    HIM ROI Authorization  05/24/13 faxed authorization    Release of Information  05/25/13    Other Photo ID Received 03/07/16 [place] card/NCDL   Advanced Beneficiary Notice (ABN) Not Received     E-Signature AOB Spanish Not Received     Documents for the Encounter   AOB (Assignment of Insurance Benefits) Not Received     E-signature AOB Received 03/05/16    MEDICARE RIGHTS Not Received     E-signature Medicare Rights       Admission Information    Attending Provider Admitting Provider Admission Type Admission Date/Time    Briscoe Deutscher, MD Emergency 03/05/16 1100   Discharge Date Hospital Service Auth/Cert Status Service Area   03/11/16 Family Medicine Incomplete [place]  SERVICE AREA   Unit Room/Bed Admission Status   AP-DEPT 300 A307/A307-01 Discharged (Confirmed)         Admission    Complaint   [facility] Account    Name Acct ID Class Status Primary Coverage   [name], [name] [id number] Inpatient Discharged/Not Billed None        Guarantor Account (for Hospital Account [id number])    Name Relation to Pt Service Area Active? Acct Type   [name] Self CHSA Yes Personal/Family   Address Phone       [phone] [place], [place] [address] [phone](H)          Coverage Information (for Hospital Account [id number])    Not on file

## 2016-03-11 NOTE — Progress Notes (Signed)
Pt's IV catheter removed and intact. Pt's IV site clean dry and intact. Discharge instructions including medications and follow up appointments were reviewed and discussed with patient. Pt verbalized understanding of discharge instructions including medications and follow up appointments. All questions were answered and no further questions at this time. Pt in stable condition and in no acute distress at time of discharge. Spanish translator was utilized for discharge instructions. Via Telephonic interpreting. Aziel # [phone] Musician) was utilized. Pt will be escorted by nurse tech.

## 2016-04-03 ENCOUNTER — Ambulatory Visit: Payer: Self-pay | Admitting: "Endocrinology

## 2016-05-19 ENCOUNTER — Encounter (HOSPITAL_COMMUNITY): Payer: Self-pay | Admitting: *Deleted

## 2016-05-19 ENCOUNTER — Emergency Department (HOSPITAL_COMMUNITY)
Admission: EM | Admit: 2016-05-19 | Discharge: 2016-05-19 | Disposition: A | Discharge status: Home / Self Care | Payer: Self-pay | Attending: Emergency Medicine | Admitting: Emergency Medicine

## 2016-05-19 DIAGNOSIS — Z7982 Long term (current) use of aspirin: Secondary | ICD-10-CM | POA: Insufficient documentation

## 2016-05-19 DIAGNOSIS — N390 Urinary tract infection, site not specified: Secondary | ICD-10-CM | POA: Insufficient documentation

## 2016-05-19 DIAGNOSIS — R42 Dizziness and giddiness: Secondary | ICD-10-CM

## 2016-05-19 DIAGNOSIS — I10 Essential (primary) hypertension: Secondary | ICD-10-CM | POA: Insufficient documentation

## 2016-05-19 DIAGNOSIS — E119 Type 2 diabetes mellitus without complications: Secondary | ICD-10-CM | POA: Insufficient documentation

## 2016-05-19 DIAGNOSIS — Z794 Long term (current) use of insulin: Secondary | ICD-10-CM | POA: Insufficient documentation

## 2016-05-19 DIAGNOSIS — Z79899 Other long term (current) drug therapy: Secondary | ICD-10-CM | POA: Insufficient documentation

## 2016-05-19 DIAGNOSIS — E860 Dehydration: Secondary | ICD-10-CM | POA: Insufficient documentation

## 2016-05-19 LAB — URINE MICROSCOPIC-ADD ON: RBC / HPF: NONE SEEN RBC/hpf (ref 0–5)

## 2016-05-19 LAB — URINALYSIS, ROUTINE W REFLEX MICROSCOPIC
Bilirubin Urine: NEGATIVE
GLUCOSE, UA: 250 mg/dL — AB
Hgb urine dipstick: NEGATIVE
KETONES UR: NEGATIVE mg/dL
Nitrite: NEGATIVE
PH: 5.5 (ref 5.0–8.0)
Protein, ur: NEGATIVE mg/dL
Specific Gravity, Urine: 1.015 (ref 1.005–1.030)

## 2016-05-19 LAB — CBC
HEMATOCRIT: 34 % — AB (ref 36.0–46.0)
Hemoglobin: 11 g/dL — ABNORMAL LOW (ref 12.0–15.0)
MCH: 26.3 pg (ref 26.0–34.0)
MCHC: 32.4 g/dL (ref 30.0–36.0)
MCV: 81.3 fL (ref 78.0–100.0)
Platelets: 233 10*3/uL (ref 150–400)
RBC: 4.18 MIL/uL (ref 3.87–5.11)
RDW: 13.4 % (ref 11.5–15.5)
WBC: 7.3 10*3/uL (ref 4.0–10.5)

## 2016-05-19 LAB — BASIC METABOLIC PANEL
Anion gap: 7 (ref 5–15)
BUN: 31 mg/dL — AB (ref 6–20)
CHLORIDE: 106 mmol/L (ref 101–111)
CO2: 24 mmol/L (ref 22–32)
Calcium: 8.6 mg/dL — ABNORMAL LOW (ref 8.9–10.3)
Creatinine, Ser: 1.14 mg/dL — ABNORMAL HIGH (ref 0.44–1.00)
GFR calc Af Amer: >60 mL/min (ref >60)
GFR calc non Af Amer: 53 mL/min — ABNORMAL LOW (ref >60)
GLUCOSE: 206 mg/dL — AB (ref 65–99)
POTASSIUM: 3.9 mmol/L (ref 3.5–5.1)
Sodium: 137 mmol/L (ref 135–145)

## 2016-05-19 LAB — CBG MONITORING, ED: Glucose-Capillary: 190 mg/dL — ABNORMAL HIGH (ref 65–99)

## 2016-05-19 MED ORDER — CEPHALEXIN 500 MG PO CAPS: 500.0000 mg | ORAL_CAPSULE | Freq: Four times a day (QID) | ORAL | 0 refills | Status: DC

## 2016-05-19 MED ORDER — CEPHALEXIN 500 MG PO CAPS
500.0000 mg | ORAL_CAPSULE | Freq: Once | ORAL | Status: AC
  Administered 2016-05-19: 500 mg via ORAL
  Filled 2016-05-19: qty 1

## 2016-05-19 NOTE — ED Provider Notes (Signed)
[facility] DEPT Provider Note   CSN: [id number] Arrival date & time: 05/19/16  1150  First Provider Contact:  None       History   Chief Complaint Chief Complaint  Patient presents with  . Dizziness    HPI Jill [name] is a 55 y.o. female.  HPI  The patient is a 55 year old female, she has a history of diabetes, hypertension and hyperlipidemia. She reports that over the last week she has had some intermittent lightheadedness when she stands feeling as though she is going to pass out. She never has these symptoms when she is sitting or laying down. [facility] this [facility] to pick up her refill on her medications and while she was there she asked them to check her out. They noticed that she was lightheaded and dizzy and that they were unable to get her blood pressure because she was so dizzy, they also noticed that she had a urinary infection, none of this lab information accompanies the patient. She denies any other symptoms whatsoever including fevers, chills, diarrhea, rectal bleeding, swelling, rashes, abdominal pain, back pain, chest pain, shortness of breath, cough, sore throat, headache, blurred vision, numbness or weakness. 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 She has been drinking plenty of fluids, denies any significant sun exposures and has not had any new medications.  Past Medical History:  Diagnosis Date  . Diabetes mellitus without complication (HCC)   . Hyperlipidemia   . Hypertension     Patient Active Problem List   Diagnosis Date Noted  . Bronchopneumonia 03/06/2016  . CAP (community acquired pneumonia) 03/05/2016  . Insulin dependent diabetes mellitus (HCC) 03/05/2016  . Essential hypertension 03/05/2016  . Hyponatremia 03/05/2016  . Normocytic anemia 03/05/2016  . Hyperglycemia 03/05/2016  . Dehydration 03/05/2016  .  Hypoxia 03/05/2016  . Leukocytosis 03/05/2016  . Acute respiratory failure with hypoxia (HCC) 03/05/2016    Past Surgical History:  Procedure Laterality Date  . TUBAL LIGATION      OB History    Gravida Para Term Preterm AB Living   4 4 4          SAB TAB Ectopic Multiple Live Births                   Home Medications    Prior to Admission medications   Medication Sig Start Date End Date Taking? Authorizing Provider  aspirin EC 81 MG tablet Take 81 mg by mouth daily.   Yes Historical Provider, MD  gabapentin (NEURONTIN) 400 MG capsule Take 400 mg by mouth 3 (three) times daily.   Yes Historical Provider, MD  insulin aspart (NOVOLOG) 100 UNIT/ML injection Inject 19 Units into the skin 2 (two) times daily.   Yes Historical Provider, MD  insulin detemir (LEVEMIR) 100 UNIT/ML injection Inject 40 Units into the skin daily.   Yes Historical Provider, MD  lisinopril (PRINIVIL,ZESTRIL) 10 MG tablet Take 10 mg by mouth daily.   Yes Historical Provider, MD  rosuvastatin (CRESTOR) 20 MG tablet Take 20 mg by mouth daily.   Yes Historical Provider, MD  sitaGLIPtin-metformin (JANUMET) 50-1000 MG tablet Take 1 tablet by mouth 2 (two) times daily with a meal.   Yes Historical Provider, MD  cephALEXin (KEFLEX) 500 MG capsule Take 1 capsule (500 mg total) by mouth 4 (four) times daily. 05/19/16   Eber Hong, MD  Family History Family History  Problem Relation Age of Onset  . Diabetes type II Other   . Hypertension Other     Social History Social History  Substance Use Topics  . Smoking status: Never Smoker  . Smokeless tobacco: Never Used  . Alcohol use No     Allergies   Review of patient's allergies indicates no known allergies.   Review of Systems Review of Systems  All other systems reviewed and are negative.    Physical Exam Updated Vital Signs BP 148/78 (BP Location: Right Arm)   Pulse 83   Temp 97.7 F (36.5 C) (Oral)   Resp 17   Ht 5\' 3"  (1.6 m)   Wt 183 lb  (83 kg)   SpO2 98%   BMI 32.42 kg/m   Physical Exam  Constitutional: She appears well-developed and well-nourished. No distress.  HENT:  Head: Normocephalic and atraumatic.  Mouth/Throat: Oropharynx is clear and moist. No oropharyngeal exudate.  Moist mucous membranes, clear pharynx  Eyes: Conjunctivae and EOM are normal. Pupils are equal, round, and reactive to light. Right eye exhibits no discharge. Left eye exhibits no discharge. No scleral icterus.  Clear conjunctiva  Neck: Normal range of motion. Neck supple. No JVD present. No thyromegaly present.  Supple neck with no lymphadenopathy or thyromegaly  Cardiovascular: Normal rate, regular rhythm, normal heart sounds and intact distal pulses.  Exam reveals no gallop and no friction rub.   No murmur heard. Pulmonary/Chest: Effort normal and breath sounds normal. No respiratory distress. She has no wheezes. She has no rales.  Abdominal: Soft. Bowel sounds are normal. She exhibits no distension and no mass. There is no tenderness.  No abdominal tenderness  Musculoskeletal: Normal range of motion. She exhibits no edema or tenderness.  Lymphadenopathy:    She has no cervical adenopathy.  Neurological: She is alert. Coordination normal.  Speech is clear, cranial nerves III through XII are intact, memory is intact, strength is normal in all 4 extremities including grips, sensation is intact to light touch and pinprick in all 4 extremities. Coordination as tested by finger-nose-finger is normal, no limb ataxia. Normal gait, normal reflexes at the patellar tendons bilaterally  Skin: Skin is warm and dry. No rash noted. No erythema.  Psychiatric: She has a normal mood and affect. Her behavior is normal.  Nursing note and vitals reviewed.    ED Treatments / Results  Labs (all labs ordered are listed, but only abnormal results are displayed) Labs Reviewed  BASIC METABOLIC PANEL - Abnormal; Notable for the following:       Result Value    Glucose, Bld 206 (*)    BUN 31 (*)    Creatinine, Ser 1.14 (*)    Calcium 8.6 (*)    GFR calc non Af Amer 53 (*)    All other components within normal limits  CBC - Abnormal; Notable for the following:    Hemoglobin 11.0 (*)    HCT 34.0 (*)    All other components within normal limits  URINALYSIS, ROUTINE W REFLEX MICROSCOPIC (NOT AT [facility]) - Abnormal; Notable for the following:    Glucose, UA 250 (*)    Leukocytes, UA SMALL (*)    All other components within normal limits  URINE MICROSCOPIC-ADD ON - Abnormal; Notable for the following:    Squamous Epithelial / LPF 0-5 (*)    Bacteria, UA MANY (*)    All other components within normal limits  CBG MONITORING, ED - Abnormal; Notable for  the following:    Glucose-Capillary 190 (*)    All other components within normal limits  URINE CULTURE    EKG  EKG Interpretation  Date/Time:  Monday May 19 2016 11:58:54 EDT Ventricular Rate:  77 PR Interval:    QRS Duration: 97 QT Interval:  407 QTC Calculation: 461 R Axis:   43 Text Interpretation:  Sinus rhythm Baseline wander in lead(s) I II aVR since last tracing no significant change Confirmed by Denelda Akerley  MD, Nealy Karapetian ([id number]) on 05/19/2016 12:13:26 PM       Radiology No results found.  Procedures Procedures (including critical care time)  Medications Ordered in ED Medications  cephALEXin (KEFLEX) capsule 500 mg (not administered)     Initial Impression / Assessment and Plan / ED Course  I have reviewed the triage vital signs and the nursing notes.  Pertinent labs & imaging results that were available during my care of the patient were reviewed by me and considered in my medical decision making (see chart for details).  Clinical Course  Comment By Time  The pt had orthostatic changes on standing with BP and pulse as well as symptomatically - she was given IVF and antibiotics.  The res of her exam is unremarkable and reassuring - she will be stable for d/c. Eber Hong, MD  07/31 1536    The patient's EKG is rather unremarkable, her vital signs are also unremarkable though I suspect she has some orthostasis based on her symptoms. We'll obtain orthostatic vital signs, give IV fluids, check urinalysis and labs, the patient is in agreement with the plan. She does not appear ill  Final Clinical Impressions(s) / ED Diagnoses   Final diagnoses:  Dehydration  UTI (lower urinary tract infection)  Lightheadedness    New Prescriptions New Prescriptions   CEPHALEXIN (KEFLEX) 500 MG CAPSULE    Take 1 capsule (500 mg total) by mouth 4 (four) times daily.     Eber Hong, MD 05/19/16 1540

## 2016-05-19 NOTE — ED Notes (Signed)
Pt ambulated with no difficulties. Pt denied any dizziness at this time.

## 2016-05-19 NOTE — ED Notes (Signed)
Pt denies any dizziness at this time

## 2016-05-19 NOTE — ED Triage Notes (Addendum)
Pt comes in by [facility] from the health department. Pt was there for dizziness. While there they found out pt had a uti. Pt became dizzy and facility couldn't get a BP on patient. Pt BP by EMS was normal. Pt is alert and oriented upon arrival.   Pt was NOT given antibiotics for the UTI since being transported to the hospital.

## 2016-05-19 NOTE — ED Notes (Signed)
Pt given bolus with MD approval.

## 2016-05-19 NOTE — ED Notes (Signed)
Lab at bedside

## 2016-05-19 NOTE — Discharge Instructions (Signed)

## 2016-05-22 LAB — URINE CULTURE

## 2016-05-23 ENCOUNTER — Telehealth (HOSPITAL_BASED_OUTPATIENT_CLINIC_OR_DEPARTMENT_OTHER): Payer: Self-pay

## 2016-05-23 ENCOUNTER — Telehealth (HOSPITAL_BASED_OUTPATIENT_CLINIC_OR_DEPARTMENT_OTHER): Payer: Self-pay | Admitting: *Deleted

## 2016-05-23 NOTE — Progress Notes (Signed)
ED Antimicrobial Stewardship Positive Culture Follow Up   Jill [name] is an 55 y.o. female who presented to [facility] on 05/19/2016 with a chief complaint of  Chief Complaint  Patient presents with  . Dizziness    Recent Results (from the past 720 hour(s))  Urine culture     Status: Abnormal   Collection Time: 05/19/16 12:28 PM  Result Value Ref Range Status   Specimen Description URINE, CLEAN CATCH  Final   Special Requests NONE  Final   Culture (A)  Final    >=100,000 COLONIES/mL KLEBSIELLA PNEUMONIAE Confirmed Extended Spectrum Beta-Lactamase Producer (ESBL) Performed at [facility]    Report Status 05/22/2016 FINAL  Final   Organism ID, Bacteria KLEBSIELLA PNEUMONIAE (A)  Final      Susceptibility   Klebsiella pneumoniae - MIC*    AMPICILLIN >=32 RESISTANT Resistant     CEFAZOLIN >=64 RESISTANT Resistant     CEFTRIAXONE >=64 RESISTANT Resistant     CIPROFLOXACIN 1 SENSITIVE Sensitive     GENTAMICIN >=16 RESISTANT Resistant     IMIPENEM 0.5 SENSITIVE Sensitive     NITROFURANTOIN 128 RESISTANT Resistant     TRIMETH/SULFA >=320 RESISTANT Resistant     AMPICILLIN/SULBACTAM >=32 RESISTANT Resistant     PIP/TAZO 16 SENSITIVE Sensitive     Extended ESBL POSITIVE Resistant     * >=100,000 COLONIES/mL KLEBSIELLA PNEUMONIAE    [x]  Treated with cephalexin, organism resistant to prescribed antimicrobial  New antibiotic prescription:  -Stop cephalexin. -If patient is having symptoms of dysuria, start ciprofloxacin 500 mg PO BID x3 days. -If asymptomatic, no further antibiotic therapy indicated.  ED Provider: Arthor Captain, PA-C  [place] Highland, PharmD PGY-1 Pharmacy Resident Pager: [phone] 05/23/2016

## 2016-05-23 NOTE — Telephone Encounter (Signed)
Post ED Visit - Positive Culture Follow-up  Culture report reviewed by antimicrobial stewardship pharmacist:  []  Enzo Bi, Pharm.D. []  Celedonio Miyamoto, Pharm.D., BCPS []  Garvin Fila, Pharm.D. []  Georgina Pillion, Pharm.D., BCPS []  [place], [address].D., BCPS, AAHIVP []  Estella Husk, Pharm.D., BCPS, AAHIVP []  Tennis Must, Pharm.D. []  Sherle Poe, [address].D. Gwyndolyn Kaufman Pharm D Positive urine culture Treated with Cephalexin, organism sensitive to the same and no further patient follow-up is required at this time.  Jerry Caras 05/23/2016, 12:30 PM

## 2017-06-04 ENCOUNTER — Ambulatory Visit (HOSPITAL_COMMUNITY)
Admission: RE | Admit: 2017-06-04 | Discharge: 2017-06-04 | Disposition: A | Discharge status: Home / Self Care | Payer: Medicaid Other | Source: Ambulatory Visit | Attending: Family | Admitting: Family

## 2017-06-04 ENCOUNTER — Other Ambulatory Visit (HOSPITAL_COMMUNITY): Payer: Self-pay | Admitting: Family

## 2017-06-04 DIAGNOSIS — L97519 Non-pressure chronic ulcer of other part of right foot with unspecified severity: Secondary | ICD-10-CM

## 2017-06-04 DIAGNOSIS — E11621 Type 2 diabetes mellitus with foot ulcer: Secondary | ICD-10-CM | POA: Insufficient documentation

## 2017-06-18 ENCOUNTER — Encounter (HOSPITAL_COMMUNITY): Payer: Self-pay | Admitting: Emergency Medicine

## 2017-06-18 ENCOUNTER — Inpatient Hospital Stay (HOSPITAL_COMMUNITY)
Admission: EM | Admit: 2017-06-18 | Discharge: 2017-06-29 | DRG: 640 | Disposition: A | Discharge status: Home / Self Care | Payer: Medicaid Other | Attending: Internal Medicine | Admitting: Internal Medicine

## 2017-06-18 ENCOUNTER — Observation Stay (HOSPITAL_COMMUNITY): Payer: Medicaid Other

## 2017-06-18 DIAGNOSIS — N183 Chronic kidney disease, stage 3 unspecified: Secondary | ICD-10-CM

## 2017-06-18 DIAGNOSIS — R748 Abnormal levels of other serum enzymes: Secondary | ICD-10-CM | POA: Diagnosis present

## 2017-06-18 DIAGNOSIS — I4892 Unspecified atrial flutter: Secondary | ICD-10-CM

## 2017-06-18 DIAGNOSIS — R0902 Hypoxemia: Secondary | ICD-10-CM

## 2017-06-18 DIAGNOSIS — Z560 Unemployment, unspecified: Secondary | ICD-10-CM

## 2017-06-18 DIAGNOSIS — E11621 Type 2 diabetes mellitus with foot ulcer: Secondary | ICD-10-CM | POA: Diagnosis present

## 2017-06-18 DIAGNOSIS — E784 Other hyperlipidemia: Secondary | ICD-10-CM

## 2017-06-18 DIAGNOSIS — R778 Other specified abnormalities of plasma proteins: Secondary | ICD-10-CM

## 2017-06-18 DIAGNOSIS — Z972 Presence of dental prosthetic device (complete) (partial): Secondary | ICD-10-CM

## 2017-06-18 DIAGNOSIS — D638 Anemia in other chronic diseases classified elsewhere: Secondary | ICD-10-CM | POA: Diagnosis present

## 2017-06-18 DIAGNOSIS — W19XXXA Unspecified fall, initial encounter: Secondary | ICD-10-CM | POA: Diagnosis not present

## 2017-06-18 DIAGNOSIS — R079 Chest pain, unspecified: Secondary | ICD-10-CM

## 2017-06-18 DIAGNOSIS — I081 Rheumatic disorders of both mitral and tricuspid valves: Secondary | ICD-10-CM | POA: Diagnosis present

## 2017-06-18 DIAGNOSIS — E1159 Type 2 diabetes mellitus with other circulatory complications: Secondary | ICD-10-CM

## 2017-06-18 DIAGNOSIS — I130 Hypertensive heart and chronic kidney disease with heart failure and stage 1 through stage 4 chronic kidney disease, or unspecified chronic kidney disease: Secondary | ICD-10-CM | POA: Diagnosis present

## 2017-06-18 DIAGNOSIS — L97509 Non-pressure chronic ulcer of other part of unspecified foot with unspecified severity: Secondary | ICD-10-CM | POA: Diagnosis present

## 2017-06-18 DIAGNOSIS — Z8249 Family history of ischemic heart disease and other diseases of the circulatory system: Secondary | ICD-10-CM

## 2017-06-18 DIAGNOSIS — Z6841 Body Mass Index (BMI) 40.0 and over, adult: Secondary | ICD-10-CM

## 2017-06-18 DIAGNOSIS — Z833 Family history of diabetes mellitus: Secondary | ICD-10-CM

## 2017-06-18 DIAGNOSIS — I2510 Atherosclerotic heart disease of native coronary artery without angina pectoris: Secondary | ICD-10-CM

## 2017-06-18 DIAGNOSIS — E1165 Type 2 diabetes mellitus with hyperglycemia: Secondary | ICD-10-CM | POA: Diagnosis present

## 2017-06-18 DIAGNOSIS — E1122 Type 2 diabetes mellitus with diabetic chronic kidney disease: Secondary | ICD-10-CM | POA: Diagnosis present

## 2017-06-18 DIAGNOSIS — E1140 Type 2 diabetes mellitus with diabetic neuropathy, unspecified: Secondary | ICD-10-CM

## 2017-06-18 DIAGNOSIS — R600 Localized edema: Secondary | ICD-10-CM

## 2017-06-18 DIAGNOSIS — IMO0002 Reserved for concepts with insufficient information to code with codable children: Secondary | ICD-10-CM

## 2017-06-18 DIAGNOSIS — I5043 Acute on chronic combined systolic (congestive) and diastolic (congestive) heart failure: Secondary | ICD-10-CM | POA: Diagnosis present

## 2017-06-18 DIAGNOSIS — L8990 Pressure ulcer of unspecified site, unspecified stage: Secondary | ICD-10-CM | POA: Insufficient documentation

## 2017-06-18 DIAGNOSIS — Z01818 Encounter for other preprocedural examination: Secondary | ICD-10-CM

## 2017-06-18 DIAGNOSIS — L89610 Pressure ulcer of right heel, unstageable: Secondary | ICD-10-CM | POA: Diagnosis present

## 2017-06-18 DIAGNOSIS — E1142 Type 2 diabetes mellitus with diabetic polyneuropathy: Secondary | ICD-10-CM | POA: Diagnosis present

## 2017-06-18 DIAGNOSIS — N179 Acute kidney failure, unspecified: Secondary | ICD-10-CM | POA: Diagnosis present

## 2017-06-18 DIAGNOSIS — I25119 Atherosclerotic heart disease of native coronary artery with unspecified angina pectoris: Secondary | ICD-10-CM | POA: Diagnosis present

## 2017-06-18 DIAGNOSIS — I2729 Other secondary pulmonary hypertension: Secondary | ICD-10-CM | POA: Diagnosis present

## 2017-06-18 DIAGNOSIS — J9621 Acute and chronic respiratory failure with hypoxia: Secondary | ICD-10-CM

## 2017-06-18 DIAGNOSIS — R0600 Dyspnea, unspecified: Secondary | ICD-10-CM

## 2017-06-18 DIAGNOSIS — Z7982 Long term (current) use of aspirin: Secondary | ICD-10-CM

## 2017-06-18 DIAGNOSIS — I10 Essential (primary) hypertension: Secondary | ICD-10-CM | POA: Diagnosis present

## 2017-06-18 DIAGNOSIS — I429 Cardiomyopathy, unspecified: Secondary | ICD-10-CM | POA: Diagnosis present

## 2017-06-18 DIAGNOSIS — R7989 Other specified abnormal findings of blood chemistry: Secondary | ICD-10-CM

## 2017-06-18 DIAGNOSIS — I340 Nonrheumatic mitral (valve) insufficiency: Secondary | ICD-10-CM

## 2017-06-18 DIAGNOSIS — E785 Hyperlipidemia, unspecified: Secondary | ICD-10-CM | POA: Diagnosis present

## 2017-06-18 DIAGNOSIS — E875 Hyperkalemia: Principal | ICD-10-CM | POA: Diagnosis present

## 2017-06-18 DIAGNOSIS — K089 Disorder of teeth and supporting structures, unspecified: Secondary | ICD-10-CM

## 2017-06-18 DIAGNOSIS — Z794 Long term (current) use of insulin: Secondary | ICD-10-CM

## 2017-06-18 DIAGNOSIS — Z79899 Other long term (current) drug therapy: Secondary | ICD-10-CM

## 2017-06-18 DIAGNOSIS — E782 Mixed hyperlipidemia: Secondary | ICD-10-CM

## 2017-06-18 HISTORY — DX: Type 2 diabetes mellitus without complications: E11.9

## 2017-06-18 HISTORY — DX: Type 2 diabetes mellitus with diabetic neuropathy, unspecified: E11.40

## 2017-06-18 LAB — COMPREHENSIVE METABOLIC PANEL
ALBUMIN: 3.4 g/dL — AB (ref 3.5–5.0)
ALK PHOS: 75 U/L (ref 38–126)
ALT: 21 U/L (ref 14–54)
ANION GAP: 4 — AB (ref 5–15)
AST: 20 U/L (ref 15–41)
BUN: 46 mg/dL — ABNORMAL HIGH (ref 6–20)
CALCIUM: 8.6 mg/dL — AB (ref 8.9–10.3)
CHLORIDE: 115 mmol/L — AB (ref 101–111)
CO2: 18 mmol/L — AB (ref 22–32)
Creatinine, Ser: 1.47 mg/dL — ABNORMAL HIGH (ref 0.44–1.00)
GFR calc non Af Amer: 39 mL/min — ABNORMAL LOW (ref >60)
GFR, EST AFRICAN AMERICAN: 45 mL/min — AB (ref >60)
GLUCOSE: 195 mg/dL — AB (ref 65–99)
SODIUM: 137 mmol/L (ref 135–145)
Total Bilirubin: 0.2 mg/dL — ABNORMAL LOW (ref 0.3–1.2)
Total Protein: 7.1 g/dL (ref 6.5–8.1)

## 2017-06-18 LAB — CBC WITH DIFFERENTIAL/PLATELET
BASOS PCT: 1 %
Basophils Absolute: 0.1 10*3/uL (ref 0.0–0.1)
Eosinophils Absolute: 0.4 10*3/uL (ref 0.0–0.7)
Eosinophils Relative: 5 %
HCT: 30.3 % — ABNORMAL LOW (ref 36.0–46.0)
HEMOGLOBIN: 9.5 g/dL — AB (ref 12.0–15.0)
LYMPHS ABS: 2.7 10*3/uL (ref 0.7–4.0)
Lymphocytes Relative: 42 %
MCH: 26.5 pg (ref 26.0–34.0)
MCHC: 31.4 g/dL (ref 30.0–36.0)
MCV: 84.4 fL (ref 78.0–100.0)
MONOS PCT: 10 %
Monocytes Absolute: 0.6 10*3/uL (ref 0.1–1.0)
NEUTROS ABS: 2.8 10*3/uL (ref 1.7–7.7)
NEUTROS PCT: 42 %
PLATELETS: 267 10*3/uL (ref 150–400)
RBC: 3.59 MIL/uL — ABNORMAL LOW (ref 3.87–5.11)
RDW: 15 % (ref 11.5–15.5)
WBC: 6.5 10*3/uL (ref 4.0–10.5)

## 2017-06-18 LAB — BASIC METABOLIC PANEL
ANION GAP: 7 (ref 5–15)
BUN: 41 mg/dL — ABNORMAL HIGH (ref 6–20)
CALCIUM: 8.8 mg/dL — AB (ref 8.9–10.3)
CO2: 20 mmol/L — AB (ref 22–32)
Chloride: 115 mmol/L — ABNORMAL HIGH (ref 101–111)
Creatinine, Ser: 1.46 mg/dL — ABNORMAL HIGH (ref 0.44–1.00)
GFR calc Af Amer: 45 mL/min — ABNORMAL LOW (ref >60)
GFR calc non Af Amer: 39 mL/min — ABNORMAL LOW (ref >60)
GLUCOSE: 176 mg/dL — AB (ref 65–99)
POTASSIUM: 5.5 mmol/L — AB (ref 3.5–5.1)
Sodium: 142 mmol/L (ref 135–145)

## 2017-06-18 LAB — CK: Total CK: 98 U/L (ref 38–234)

## 2017-06-18 LAB — GLUCOSE, CAPILLARY: Glucose-Capillary: 170 mg/dL — ABNORMAL HIGH (ref 65–99)

## 2017-06-18 LAB — TROPONIN I: Troponin I: 0.3 ng/mL (ref <0.03)

## 2017-06-18 LAB — I-STAT CHEM 8, ED
BUN: 41 mg/dL — AB (ref 6–20)
CHLORIDE: 119 mmol/L — AB (ref 101–111)
Calcium, Ion: 1.29 mmol/L (ref 1.15–1.40)
Creatinine, Ser: 1.4 mg/dL — ABNORMAL HIGH (ref 0.44–1.00)
Glucose, Bld: 141 mg/dL — ABNORMAL HIGH (ref 65–99)
HEMATOCRIT: 30 % — AB (ref 36.0–46.0)
Hemoglobin: 10.2 g/dL — ABNORMAL LOW (ref 12.0–15.0)
POTASSIUM: 7.6 mmol/L — AB (ref 3.5–5.1)
Sodium: 142 mmol/L (ref 135–145)
TCO2: 20 mmol/L — ABNORMAL LOW (ref 22–32)

## 2017-06-18 LAB — POTASSIUM: Potassium: >7.5 mmol/L (ref 3.5–5.1)

## 2017-06-18 LAB — GLUCOSE, RANDOM: GLUCOSE: 141 mg/dL — AB (ref 65–99)

## 2017-06-18 MED ORDER — ROSUVASTATIN CALCIUM 20 MG PO TABS
20.0000 mg | ORAL_TABLET | Freq: Every day | ORAL | Status: DC
  Administered 2017-06-18 – 2017-06-29 (×12): 20 mg via ORAL
  Filled 2017-06-18 (×12): qty 1

## 2017-06-18 MED ORDER — INSULIN ASPART 100 UNIT/ML IV SOLN
5.0000 [IU] | Freq: Once | INTRAVENOUS | Status: AC
  Administered 2017-06-18: 5 [IU] via INTRAVENOUS

## 2017-06-18 MED ORDER — DEXTROSE 50 % IV SOLN
INTRAVENOUS | Status: AC
  Filled 2017-06-18: qty 50

## 2017-06-18 MED ORDER — ALBUTEROL SULFATE (2.5 MG/3ML) 0.083% IN NEBU
10.0000 mg | INHALATION_SOLUTION | Freq: Once | RESPIRATORY_TRACT | Status: AC
  Administered 2017-06-18: 10 mg via RESPIRATORY_TRACT
  Filled 2017-06-18: qty 12

## 2017-06-18 MED ORDER — ACETAMINOPHEN 325 MG PO TABS
650.0000 mg | ORAL_TABLET | Freq: Four times a day (QID) | ORAL | Status: DC | PRN
  Administered 2017-06-21 – 2017-06-29 (×2): 650 mg via ORAL
  Filled 2017-06-18 (×3): qty 2

## 2017-06-18 MED ORDER — INSULIN DETEMIR 100 UNIT/ML ~~LOC~~ SOLN
15.0000 [IU] | Freq: Two times a day (BID) | SUBCUTANEOUS | Status: DC
  Administered 2017-06-18 – 2017-06-26 (×15): 15 [IU] via SUBCUTANEOUS
  Filled 2017-06-18 (×18): qty 0.15

## 2017-06-18 MED ORDER — ASPIRIN EC 81 MG PO TBEC
81.0000 mg | DELAYED_RELEASE_TABLET | Freq: Every day | ORAL | Status: DC
Start: 2017-06-18 — End: 2017-06-19
  Filled 2017-06-18: qty 1

## 2017-06-18 MED ORDER — INSULIN ASPART 100 UNIT/ML ~~LOC~~ SOLN
0.0000 [IU] | Freq: Three times a day (TID) | SUBCUTANEOUS | Status: DC
  Administered 2017-06-19: 2 [IU] via SUBCUTANEOUS
  Administered 2017-06-19: 3 [IU] via SUBCUTANEOUS
  Administered 2017-06-20: 8 [IU] via SUBCUTANEOUS
  Administered 2017-06-20: 2 [IU] via SUBCUTANEOUS
  Administered 2017-06-21: 3 [IU] via SUBCUTANEOUS
  Administered 2017-06-21: 5 [IU] via SUBCUTANEOUS
  Administered 2017-06-21: 3 [IU] via SUBCUTANEOUS
  Administered 2017-06-22: 5 [IU] via SUBCUTANEOUS
  Administered 2017-06-22: 3 [IU] via SUBCUTANEOUS
  Administered 2017-06-23 (×2): 5 [IU] via SUBCUTANEOUS
  Administered 2017-06-24: 3 [IU] via SUBCUTANEOUS
  Administered 2017-06-24 – 2017-06-27 (×3): 5 [IU] via SUBCUTANEOUS
  Administered 2017-06-28: 3 [IU] via SUBCUTANEOUS
  Administered 2017-06-28: 2 [IU] via SUBCUTANEOUS
  Administered 2017-06-28: 5 [IU] via SUBCUTANEOUS
  Administered 2017-06-29: 2 [IU] via SUBCUTANEOUS

## 2017-06-18 MED ORDER — ONDANSETRON HCL 4 MG/2ML IJ SOLN: 4.0000 mg | Freq: Four times a day (QID) | INTRAMUSCULAR | Status: DC | PRN

## 2017-06-18 MED ORDER — DEXTROSE 50 % IV SOLN
25.0000 mL | Freq: Once | INTRAVENOUS | Status: AC
  Administered 2017-06-18: 25 mL via INTRAVENOUS

## 2017-06-18 MED ORDER — DEXTROSE 10 % IV SOLN: Freq: Once | INTRAVENOUS | Status: DC

## 2017-06-18 MED ORDER — ASPIRIN EC 81 MG PO TBEC
81.0000 mg | DELAYED_RELEASE_TABLET | Freq: Every day | ORAL | Status: DC
  Administered 2017-06-18 – 2017-06-29 (×12): 81 mg via ORAL
  Filled 2017-06-18 (×11): qty 1

## 2017-06-18 MED ORDER — ENOXAPARIN SODIUM 60 MG/0.6ML ~~LOC~~ SOLN
50.0000 mg | SUBCUTANEOUS | Status: DC
  Administered 2017-06-18 – 2017-06-25 (×8): 50 mg via SUBCUTANEOUS
  Filled 2017-06-18 (×8): qty 0.6

## 2017-06-18 MED ORDER — CALCIUM GLUCONATE 10 % IV SOLN
1.0000 g | Freq: Once | INTRAVENOUS | Status: AC
  Administered 2017-06-18: 1 g via INTRAVENOUS
  Filled 2017-06-18: qty 10

## 2017-06-18 MED ORDER — SODIUM CHLORIDE 0.9 % IV SOLN: INTRAVENOUS | Status: DC

## 2017-06-18 MED ORDER — INSULIN ASPART 100 UNIT/ML ~~LOC~~ SOLN
SUBCUTANEOUS | Status: AC
  Filled 2017-06-18: qty 1

## 2017-06-18 MED ORDER — GABAPENTIN 400 MG PO CAPS
400.0000 mg | ORAL_CAPSULE | Freq: Three times a day (TID) | ORAL | Status: DC
  Administered 2017-06-18 – 2017-06-29 (×33): 400 mg via ORAL
  Filled 2017-06-18 (×33): qty 1

## 2017-06-18 MED ORDER — ALPRAZOLAM 0.5 MG PO TABS
0.5000 mg | ORAL_TABLET | Freq: Once | ORAL | Status: AC
  Administered 2017-06-18: 0.5 mg via ORAL
  Filled 2017-06-18: qty 1

## 2017-06-18 MED ORDER — SODIUM CHLORIDE 0.9 % IV SOLN
Freq: Once | INTRAVENOUS | Status: AC
  Administered 2017-06-18: 14:00:00 via INTRAVENOUS

## 2017-06-18 MED ORDER — ATORVASTATIN CALCIUM 40 MG PO TABS
40.0000 mg | ORAL_TABLET | Freq: Every day | ORAL | Status: DC
  Administered 2017-06-18 – 2017-06-22 (×5): 40 mg via ORAL
  Filled 2017-06-18 (×5): qty 1

## 2017-06-18 MED ORDER — SODIUM POLYSTYRENE SULFONATE 15 GM/60ML PO SUSP
45.0000 g | Freq: Once | ORAL | Status: AC
  Administered 2017-06-18: 45 g via ORAL
  Filled 2017-06-18: qty 180

## 2017-06-18 MED ORDER — SODIUM BICARBONATE 8.4 % IV SOLN
50.0000 meq | Freq: Once | INTRAVENOUS | Status: AC
  Administered 2017-06-18: 50 meq via INTRAVENOUS
  Filled 2017-06-18: qty 50

## 2017-06-18 MED ORDER — ACETAMINOPHEN 650 MG RE SUPP: 650.0000 mg | Freq: Four times a day (QID) | RECTAL | Status: DC | PRN

## 2017-06-18 MED ORDER — ONDANSETRON HCL 4 MG PO TABS
4.0000 mg | ORAL_TABLET | Freq: Four times a day (QID) | ORAL | Status: DC | PRN
Start: 2017-06-18 — End: 2017-06-29

## 2017-06-18 MED ORDER — INSULIN ASPART 100 UNIT/ML ~~LOC~~ SOLN
0.0000 [IU] | Freq: Every day | SUBCUTANEOUS | Status: DC
  Administered 2017-06-19 – 2017-06-21 (×2): 2 [IU] via SUBCUTANEOUS
  Administered 2017-06-24: 3 [IU] via SUBCUTANEOUS
  Administered 2017-06-25: 4 [IU] via SUBCUTANEOUS
  Administered 2017-06-26: 3 [IU] via SUBCUTANEOUS

## 2017-06-18 MED ORDER — LORATADINE 10 MG PO TABS
10.0000 mg | ORAL_TABLET | Freq: Every day | ORAL | Status: DC
  Administered 2017-06-18 – 2017-06-29 (×12): 10 mg via ORAL
  Filled 2017-06-18 (×12): qty 1

## 2017-06-18 NOTE — Progress Notes (Signed)
Lab called critical values of K 5.5 and Trop 0.30 MD made aware via text page.

## 2017-06-18 NOTE — ED Provider Notes (Signed)
[facility] Provider Note   CSN: [id number] Arrival date & time: 06/18/17  4098     History   Chief Complaint Chief Complaint  Patient presents with  . Abnormal Lab    HPI Jill [name] is a 56 y.o. female.  Pt was seen by primary care yesterday and had blood drawn.  Pt's potassium reported to be over 7.5   Pt reports she feels well.  No fever no chills no cough no swelling, no vomiting.  Pt denies any shortness of breath. No swelling.  Pt reports she is urinating normally,    The history is provided by the patient. No language interpreter was used.  Abnormal Lab  Time since result:  Yesterday Patient referred by:  PCP Result type: chemistry   Chemistry:    Potassium:  High   Past Medical History:  Diagnosis Date  . Diabetes mellitus without complication (HCC)   . Hyperlipidemia   . Hypertension     Patient Active Problem List   Diagnosis Date Noted  . Bronchopneumonia 03/06/2016  . CAP (community acquired pneumonia) 03/05/2016  . Insulin dependent diabetes mellitus (HCC) 03/05/2016  . Essential hypertension 03/05/2016  . Hyponatremia 03/05/2016  . Normocytic anemia 03/05/2016  . Hyperglycemia 03/05/2016  . Dehydration 03/05/2016  . Hypoxia 03/05/2016  . Leukocytosis 03/05/2016  . Acute respiratory failure with hypoxia (HCC) 03/05/2016    Past Surgical History:  Procedure Laterality Date  . TUBAL LIGATION      OB History    Gravida Para Term Preterm AB Living   4 4 4          SAB TAB Ectopic Multiple Live Births                   Home Medications    Prior to Admission medications   Medication Sig Start Date End Date Taking? Authorizing Provider  aspirin EC 81 MG tablet Take 81 mg by mouth daily.   Yes [provider]  atorvastatin (LIPITOR) 40 MG tablet Take 40 mg by mouth at bedtime.   Yes [provider]  benzonatate (TESSALON) 100 MG capsule Take by mouth 3 (three) times daily as needed for cough.   Yes [provider]  cephALEXin (KEFLEX) 500 MG capsule Take 500 mg by mouth 3 (three) times daily. For 10 days 06/17/17  Yes [provider]  Dulaglutide (TRULICITY) 0.75 MG/0.5ML SOPN Inject 0.75 mg into the skin once a week.   Yes [provider]  ferrous sulfate 325 (65 FE) MG EC tablet Take 325 mg by mouth daily with breakfast.   Yes [provider]  gabapentin (NEURONTIN) 400 MG capsule Take 400 mg by mouth 3 (three) times daily.   Yes [provider]  hydrochlorothiazide (HYDRODIURIL) 25 MG tablet Take 25 mg by mouth daily.   Yes [provider]  ibuprofen (ADVIL,MOTRIN) 800 MG tablet Take 800 mg by mouth 3 (three) times daily as needed for headache, mild pain or moderate pain.   Yes [provider]  insulin detemir (LEVEMIR) 100 UNIT/ML injection Inject 25 Units into the skin 2 (two) times daily.    Yes [provider]  lisinopril (PRINIVIL,ZESTRIL) 5 MG tablet Take 5 mg by mouth daily.   Yes [provider]  loratadine (CLARITIN) 10 MG tablet Take 10 mg by mouth daily.   Yes [provider]  rosuvastatin (CRESTOR) 20 MG tablet Take 20 mg by mouth daily.   Yes [provider]  sitaGLIPtin-metformin Thera Flake)  50-1000 MG tablet Take 1 tablet by mouth 2 (two) times daily with a meal.   Yes [provider]  vitamin C (ASCORBIC ACID) 500 MG tablet Take 500 mg by mouth daily.   Yes [provider]    Family History Family History  Problem Relation Age of Onset  . Diabetes type II Other   . Hypertension Other     Social History Social History  Substance Use Topics  . Smoking status: Never Smoker  . Smokeless tobacco: Never Used  . Alcohol use No     Allergies   Patient has no known allergies.   Review of Systems Review of Systems  All other systems reviewed and are negative.    Physical Exam Updated Vital Signs BP 119/80   Pulse 75   Temp 97.6 F (36.4 C) (Oral)   Resp  14   Ht 5\' 2"  (1.575 m)   Wt 101.2 kg (223 lb)   SpO2 95%   BMI 40.79 kg/m   Physical Exam  Constitutional: She appears well-developed and well-nourished. No distress.  HENT:  Head: Normocephalic and atraumatic.  Eyes: Conjunctivae are normal.  Neck: Neck supple.  Cardiovascular: Normal rate and regular rhythm.   No murmur heard. Pulmonary/Chest: Effort normal and breath sounds normal. No respiratory distress.  Abdominal: Soft. There is no tenderness.  Musculoskeletal: She exhibits no edema.  Neurological: She is alert.  Skin: Skin is warm and dry.  Psychiatric: She has a normal mood and affect.  Nursing note and vitals reviewed.    ED Treatments / Results  Labs (all labs ordered are listed, but only abnormal results are displayed) Labs Reviewed  CBC WITH DIFFERENTIAL/PLATELET - Abnormal; Notable for the following:       Result Value   RBC 3.59 (*)    Hemoglobin 9.5 (*)    HCT 30.3 (*)    All other components within normal limits  COMPREHENSIVE METABOLIC PANEL - Abnormal; Notable for the following:    Potassium >7.5 (*)    Chloride 115 (*)    CO2 18 (*)    Glucose, Bld 195 (*)    BUN 46 (*)    Creatinine, Ser 1.47 (*)    Calcium 8.6 (*)    Albumin 3.4 (*)    Total Bilirubin 0.2 (*)    GFR calc non Af Amer 39 (*)    GFR calc Af Amer 45 (*)    Anion gap 4 (*)    All other components within normal limits  POTASSIUM - Abnormal; Notable for the following:    Potassium >7.5 (*)    All other components within normal limits  I-STAT CHEM 8, ED - Abnormal; Notable for the following:    Potassium 7.6 (*)    Chloride 119 (*)    BUN 41 (*)    Creatinine, Ser 1.40 (*)    Glucose, Bld 141 (*)    TCO2 20 (*)    Hemoglobin 10.2 (*)    HCT 30.0 (*)    All other components within normal limits  CBC WITH DIFFERENTIAL/PLATELET    EKG  EKG Interpretation  Date/Time:  Thursday June 18 2017 11:57:30 EDT Ventricular Rate:  75 PR Interval:    QRS Duration: 87 QT  Interval:  362 QTC Calculation: 405 R Axis:   73 Text Interpretation:  Sinus rhythm Borderline repolarization abnormality ST abnormalities in lateral leads and inferior leads  Confirmed by Crista Curb [phone]) on 06/18/2017 12:00:45 PM  Radiology No results found.  Procedures Procedures (including critical care time)  Medications Ordered in ED Medications  0.9 %  sodium chloride infusion ( Intravenous New Bag/Given 06/18/17 1350)     Initial Impression / Assessment and Plan / ED Course  I have reviewed the triage vital signs and the nursing notes.  Pertinent labs & imaging results that were available during my care of the patient were reviewed by me and considered in my medical decision making (see chart for details).  Clinical Course as of Jun 18 1352  Thu Jun 18, 2017  1157 Potassium: (!!) >7.5 [LS]    Clinical Course User Index [LS] Elson Areas, PA-C    Potassium by lab over 7.5 x 3.  Istat troponin 7.6.  EKG  No acute changes when compared with previous EKG.    Final Clinical Impressions(s) / ED Diagnoses   Final diagnoses:  Hyperkalemia   Dr. Verdie Mosher in to see and examine  New Prescriptions New Prescriptions   No medications on file     Osie Cheeks 06/18/17 1408    Lavera Guise, MD 06/18/17 [phone]

## 2017-06-18 NOTE — ED Notes (Signed)
Pt is now in restroom

## 2017-06-18 NOTE — ED Triage Notes (Signed)
Saw PCP yesterday had labs done, they called her this morning with elevated potassium and told to come to ED

## 2017-06-18 NOTE — ED Provider Notes (Signed)
Medical screening examination/treatment/procedure(s) were conducted as a shared visit with non-physician practitioner(s) and myself.  I personally evaluated the patient during the encounter.   EKG Interpretation  Date/Time:  Thursday June 18 2017 11:57:30 EDT Ventricular Rate:  75 PR Interval:    QRS Duration: 87 QT Interval:  362 QTC Calculation: 405 R Axis:   73 Text Interpretation:  Sinus rhythm Borderline repolarization abnormality ST abnormalities in lateral leads and inferior leads  Confirmed by Crista Curb [phone]) on 06/18/2017 12:00:20 PM      56 year old female who presents with abnormal lab. Sent from PCP's office for abnormal potassium drawn yesterday. She saw PCP yesterday for right foot pain, and had blood work done.  Denies supplementation of potassium, no changes in urine output. Denies chest pain, dyspnea, abdominal pain, n/v/d.   She is well appearing. Normal vital signs. She has nonfocal exam. Potassium drawn 4 times. Elevated > 7.5 without hemolysis. Some TWI in inferolateral leads, but no abnormal QRS, peaked t waves, p wave flattening. Mild AKI but no renal failure. No symptoms of adrenal crisis or insufficiency. No new medictions.   Plan to treat temporarily and admit. May need nephrology consult.   CRITICAL CARE Performed by: Lavera Guise   Total critical care time: 31 minutes  Critical care time was exclusive of separately billable procedures and treating other patients.  Critical care was necessary to treat or prevent imminent or life-threatening deterioration.  Critical care was time spent personally by me on the following activities: development of treatment plan with patient and/or surrogate as well as nursing, discussions with consultants, evaluation of patient's response to treatment, examination of patient, obtaining history from patient or surrogate, ordering and performing treatments and interventions, ordering and review of laboratory studies,  ordering and review of radiographic studies, pulse oximetry and re-evaluation of patient's condition.    Lavera Guise, MD 06/18/17 [phone]

## 2017-06-18 NOTE — ED Notes (Signed)
Lab in room redrawing labs due to them hemolyzing

## 2017-06-18 NOTE — ED Notes (Addendum)
CRITICAL VALUE ALERT  Critical Value:  (repeat) potassium = >7.5  Date & Time Notied:  06/18/17  Provider Notified: Keenan Bachelor, PA-C  Orders Received/Actions taken:

## 2017-06-18 NOTE — ED Notes (Signed)
Lab done drawing Potassium

## 2017-06-18 NOTE — ED Notes (Signed)
Going for xray  

## 2017-06-18 NOTE — H&P (Signed)
History and Physical  Jill [name] HRC:[id number] DOB: 09-05-1961 DOA: 06/18/2017   PCP: Health, [facility]   Patient coming from: Home  Chief Complaint: elevated potassium  HPI:  Jill [name] is a 56 y.o. female with medical history of essential hypertension, diabetes mellitus, hyperlipidemia, diabetic peripheral neuropathy presented after instructed by the [facility] health Department to present to the emergency department secondary to elevated potassium. The patient was seen at the health department on 06/17/2017 secondary to diabetic foot ulcer. Apparently, routine blood work was obtained. The patient was contacted on the morning of 06/18/2017 to report to the emergency department secondary to elevated potassium. Upon presentation, The patient was noted to have a potassium  >7.5.  Apparently, there was some concern for hemolysis of the sample. Repeat serum potassium was obtained and again was  >7.5.  Again, there was some concern in the emergency department that this may have been spurious. As a result, the potassium was checked a second time with I-STAT device and it was 7.6. As a result, admission was requested.   The patient denies any fevers, chills, nausea, vomiting, diarrhea, abdominal pain, headache, dizziness, palpitations. The patient states that she has been eating a lot of fruits including grapes, peaches, strawberries, and tomatoes. However, the patient denies using any salt substitutes or taking any over-the-counter supplements. She denies any new medications other than the cephalexin that was started on 06/17/2017. The patient states that she has been taking ibuprofen 800 mg, 1 tablet daily for the past 3-4 weeks as well as when necessary for joint pains.  During my evaluation, the patient also mentioned that she's been having some "chest burning" for the past several months when she has been taking walks with her family. She has also been expressing some  dyspnea on exertion.  In the emergency department, the patient was afebrile and hemodynamically stable saturating 96-97 percent on room air. At the time of my evaluation, temporizing measures have not yet been provided for her hyperkalemia. EKG shows sinus rhythm with T-wave inversion in V4-V6, III, aVF.  Serum creatinine was 1.47 with unremarkable LFTs. CBC was essentially unremarkable except for hemoglobin 9.5.  Assessment/Plan: Hyperkalemia  -Likely multifactorial including medications (lisinopril, ibuprofen), AKI, and exogenous intake from food -I have ordered kayexalate 45 grams -I asked ED provider to start temporizing measures ASAP -admit to stepdown -Discontinue lisinopril and ibuprofen  -Repeat BMP at 1800 hrs.   AKI -baseline creatinine 0.7-0.9 -presenting creatinine 1.47 -Discontinue lisinopril, ibuprofen  -Check CPK  -Gentle hydration  -Renal ultrasound   Chest discomfort  -Cycle troponins  -Echocardiogram  -CXR -continue ASA  Diabetes mellitus type 2 with neuropathy -Continue Levemir at reduced dose -NovoLog sliding scale -Monitor CBGs and adjust insulin -Hemoglobin A1c -Continue gabapentin -holding janumet  Hyperlipidemia -Continue statin  Diabetic foot ulcers -Do not appear to be infected on clinical exam -consult wound care -d/c cephlexin      Past Medical History:  Diagnosis Date  . Diabetes mellitus without complication (HCC)   . Hyperlipidemia   . Hypertension    Past Surgical History:  Procedure Laterality Date  . TUBAL LIGATION     Social History:  reports that she has never smoked. She has never used smokeless tobacco. She reports that she does not drink alcohol or use drugs.   Family History  Problem Relation Age of Onset  . Diabetes type II Other   . Hypertension Other      No Known Allergies  Prior to Admission medications   Medication Sig Start Date End Date Taking? Authorizing Provider  aspirin EC 81 MG tablet Take 81  mg by mouth daily.   Yes [provider]  atorvastatin (LIPITOR) 40 MG tablet Take 40 mg by mouth at bedtime.   Yes [provider]  benzonatate (TESSALON) 100 MG capsule Take by mouth 3 (three) times daily as needed for cough.   Yes [provider]  cephALEXin (KEFLEX) 500 MG capsule Take 500 mg by mouth 3 (three) times daily. For 10 days 06/17/17  Yes [provider]  Dulaglutide (TRULICITY) 0.75 MG/0.5ML SOPN Inject 0.75 mg into the skin once a week.   Yes [provider]  ferrous sulfate 325 (65 FE) MG EC tablet Take 325 mg by mouth daily with breakfast.   Yes [provider]  gabapentin (NEURONTIN) 400 MG capsule Take 400 mg by mouth 3 (three) times daily.   Yes [provider]  hydrochlorothiazide (HYDRODIURIL) 25 MG tablet Take 25 mg by mouth daily.   Yes [provider]  ibuprofen (ADVIL,MOTRIN) 800 MG tablet Take 800 mg by mouth 3 (three) times daily as needed for headache, mild pain or moderate pain.   Yes [provider]  insulin detemir (LEVEMIR) 100 UNIT/ML injection Inject 25 Units into the skin 2 (two) times daily.    Yes [provider]  lisinopril (PRINIVIL,ZESTRIL) 5 MG tablet Take 5 mg by mouth daily.   Yes [provider]  loratadine (CLARITIN) 10 MG tablet Take 10 mg by mouth daily.   Yes [provider]  rosuvastatin (CRESTOR) 20 MG tablet Take 20 mg by mouth daily.   Yes [provider]  sitaGLIPtin-metformin (JANUMET) 50-1000 MG tablet Take 1 tablet by mouth 2 (two) times daily with a meal.   Yes [provider]  vitamin C (ASCORBIC ACID) 500 MG tablet Take 500 mg by mouth daily.   Yes [provider]    Review of Systems:  Constitutional:  No weight loss, night sweats, Fevers, chills, fatigue.  Head&Eyes: No headache.  No vision loss.  No eye pain or scotoma ENT:  No Difficulty swallowing,Tooth/dental problems,Sore throat,  No ear ache,  post nasal drip,  Cardio-vascular:  NoOrthopnea, PND, swelling in lower extremities,  dizziness, palpitations  GI:  No  abdominal pain, nausea, vomiting, diarrhea, loss of appetite, hematochezia, melena, heartburn, indigestion, Resp:   No cough. No coughing up of blood .No wheezing.No chest wall deformity  Skin:  no rash or lesions.  GU:  no dysuria, change in color of urine, no urgency or frequency. No flank pain.  Musculoskeletal:  No joint pain or swelling. No decreased range of motion. No back pain.  Psych:  No change in mood or affect. No depression or anxiety. Neurologic: No headache, no dysesthesia, no focal weakness, no vision loss. No syncope  Physical Exam: Vitals:   06/18/17 0954 06/18/17 1200 06/18/17 1419  BP: (!) 105/51 119/80 124/72  Pulse: 79 75 71  Resp: 20 14 13   Temp: 97.6 F (36.4 C)    TempSrc: Oral    SpO2: 98% 95% 98%  Weight: 101.2 kg (223 lb)    Height: 5\' 2"  (1.575 m)     General:  A&O x 3, NAD, nontoxic, pleasant/cooperative Head/Eye: No conjunctival hemorrhage, no icterus, [place]/AT, No nystagmus ENT:  No icterus,  No thrush, good dentition, no pharyngeal exudate Neck:  No masses, no lymphadenpathy, no bruits CV:  RRR, no rub, no gallop, no  S3 Lung:  Bibasilar crackles. No wheezing. Good air movement. Abdomen: soft/NT, +BS, nondistended, no peritoneal signs Ext: No cyanosis, No rashes, No petechiae, No lymphangitis, No edema. Skin maceration and tear in the webspace between the right first and second toes without any erythema, drainage. Stage II ulceration on the right heel without erythema, drainage.  Neuro: CNII-XII intact, strength 4/5 in bilateral upper and lower extremities, no dysmetria  Labs on Admission:  Basic Metabolic Panel:  Recent Labs Lab 06/18/17 1057 06/18/17 1221 06/18/17 1344  NA 137  --  142  K >7.5* >7.5* 7.6*  CL 115*  --  119*  CO2 18*  --   --   GLUCOSE 195*  --  141*  BUN 46*  --  41*  CREATININE 1.47*  --  1.40*    CALCIUM 8.6*  --   --    Liver Function Tests:  Recent Labs Lab 06/18/17 1057  AST 20  ALT 21  ALKPHOS 75  BILITOT 0.2*  PROT 7.1  ALBUMIN 3.4*   No results for input(s): LIPASE, AMYLASE in the last 168 hours. No results for input(s): AMMONIA in the last 168 hours. CBC:  Recent Labs Lab 06/18/17 1057 06/18/17 1344  WBC 6.5  --   NEUTROABS 2.8  --   HGB 9.5* 10.2*  HCT 30.3* 30.0*  MCV 84.4  --   PLT 267  --    Coagulation Profile: No results for input(s): INR, PROTIME in the last 168 hours. Cardiac Enzymes: No results for input(s): CKTOTAL, CKMB, CKMBINDEX, TROPONINI in the last 168 hours. BNP: Invalid input(s): POCBNP CBG: No results for input(s): GLUCAP in the last 168 hours. Urine analysis:    Component Value Date/Time   COLORURINE YELLOW 05/19/2016 1200   APPEARANCEUR CLEAR 05/19/2016 1200   LABSPEC 1.015 05/19/2016 1200   PHURINE 5.5 05/19/2016 1200   GLUCOSEU 250 (A) 05/19/2016 1200   HGBUR NEGATIVE 05/19/2016 1200   BILIRUBINUR NEGATIVE 05/19/2016 1200   KETONESUR NEGATIVE 05/19/2016 1200   PROTEINUR NEGATIVE 05/19/2016 1200   UROBILINOGEN 0.2 05/20/2013 0943   NITRITE NEGATIVE 05/19/2016 1200   LEUKOCYTESUR SMALL (A) 05/19/2016 1200   Sepsis Labs: @LABRCNTIP (procalcitonin:4,lacticidven:4) )No results found for this or any previous visit (from the past 240 hour(s)).   Radiological Exams on Admission: No results found.  EKG: Independently reviewed. Sinus, TWI III, aVF, V4-V6    Time spent:60 minutes Code Status:   FULL Family Communication:  No Family at bedside Disposition Plan: expect 1-2 day hospitalization Consults called: none DVT Prophylaxis: [place] Lovenox  Makinley Muscato, DO  [facility] Pager [phone]  If 7PM-7AM, please contact night-coverage [email or web address] Password [email or web address] 06/18/2017, 3:15 PM

## 2017-06-19 ENCOUNTER — Encounter (HOSPITAL_COMMUNITY): Payer: Self-pay | Admitting: Cardiology

## 2017-06-19 ENCOUNTER — Observation Stay (HOSPITAL_BASED_OUTPATIENT_CLINIC_OR_DEPARTMENT_OTHER): Payer: Medicaid Other

## 2017-06-19 ENCOUNTER — Observation Stay (HOSPITAL_COMMUNITY): Payer: Medicaid Other

## 2017-06-19 DIAGNOSIS — I340 Nonrheumatic mitral (valve) insufficiency: Secondary | ICD-10-CM

## 2017-06-19 DIAGNOSIS — E1140 Type 2 diabetes mellitus with diabetic neuropathy, unspecified: Secondary | ICD-10-CM

## 2017-06-19 DIAGNOSIS — E782 Mixed hyperlipidemia: Secondary | ICD-10-CM

## 2017-06-19 DIAGNOSIS — E1149 Type 2 diabetes mellitus with other diabetic neurological complication: Secondary | ICD-10-CM

## 2017-06-19 DIAGNOSIS — N183 Chronic kidney disease, stage 3 unspecified: Secondary | ICD-10-CM

## 2017-06-19 DIAGNOSIS — E1142 Type 2 diabetes mellitus with diabetic polyneuropathy: Secondary | ICD-10-CM

## 2017-06-19 DIAGNOSIS — R7989 Other specified abnormal findings of blood chemistry: Secondary | ICD-10-CM

## 2017-06-19 DIAGNOSIS — E875 Hyperkalemia: Principal | ICD-10-CM

## 2017-06-19 DIAGNOSIS — E1165 Type 2 diabetes mellitus with hyperglycemia: Secondary | ICD-10-CM

## 2017-06-19 DIAGNOSIS — R778 Other specified abnormalities of plasma proteins: Secondary | ICD-10-CM

## 2017-06-19 DIAGNOSIS — L8990 Pressure ulcer of unspecified site, unspecified stage: Secondary | ICD-10-CM | POA: Insufficient documentation

## 2017-06-19 DIAGNOSIS — I10 Essential (primary) hypertension: Secondary | ICD-10-CM

## 2017-06-19 DIAGNOSIS — R072 Precordial pain: Secondary | ICD-10-CM

## 2017-06-19 DIAGNOSIS — R748 Abnormal levels of other serum enzymes: Secondary | ICD-10-CM

## 2017-06-19 DIAGNOSIS — R0609 Other forms of dyspnea: Secondary | ICD-10-CM

## 2017-06-19 LAB — GLUCOSE, CAPILLARY
Glucose-Capillary: 143 mg/dL — ABNORMAL HIGH (ref 65–99)
Glucose-Capillary: 73 mg/dL (ref 65–99)
Glucose-Capillary: 182 mg/dL — ABNORMAL HIGH (ref 65–99)
Glucose-Capillary: 219 mg/dL — ABNORMAL HIGH (ref 65–99)

## 2017-06-19 LAB — BASIC METABOLIC PANEL
Anion gap: 5 (ref 5–15)
BUN: 41 mg/dL — AB (ref 6–20)
CALCIUM: 8.4 mg/dL — AB (ref 8.9–10.3)
CHLORIDE: 115 mmol/L — AB (ref 101–111)
CO2: 21 mmol/L — AB (ref 22–32)
CREATININE: 1.29 mg/dL — AB (ref 0.44–1.00)
GFR calc Af Amer: 53 mL/min — ABNORMAL LOW (ref >60)
GFR calc non Af Amer: 45 mL/min — ABNORMAL LOW (ref >60)
Glucose, Bld: 213 mg/dL — ABNORMAL HIGH (ref 65–99)
Potassium: 6 mmol/L — ABNORMAL HIGH (ref 3.5–5.1)
SODIUM: 141 mmol/L (ref 135–145)

## 2017-06-19 LAB — HEMOGLOBIN A1C
HEMOGLOBIN A1C: 10.4 % — AB (ref 4.8–5.6)
Mean Plasma Glucose: 251.78 mg/dL

## 2017-06-19 LAB — ECHOCARDIOGRAM COMPLETE
Height: 62 in
Weight: 3561.6 oz

## 2017-06-19 LAB — TROPONIN I
TROPONIN I: 0.26 ng/mL — AB (ref <0.03)
Troponin I: 0.25 ng/mL (ref <0.03)

## 2017-06-19 LAB — POTASSIUM: Potassium: 5.1 mmol/L (ref 3.5–5.1)

## 2017-06-19 MED ORDER — DEXTROSE 50 % IV SOLN
1.0000 | Freq: Once | INTRAVENOUS | Status: AC
  Administered 2017-06-19: 50 mL via INTRAVENOUS
  Filled 2017-06-19: qty 50

## 2017-06-19 MED ORDER — SODIUM BICARBONATE 8.4 % IV SOLN
50.0000 meq | Freq: Once | INTRAVENOUS | Status: AC
  Administered 2017-06-19: 50 meq via INTRAVENOUS
  Filled 2017-06-19: qty 50

## 2017-06-19 MED ORDER — LIVING WELL WITH DIABETES BOOK - IN SPANISH
Freq: Once | Status: DC
  Filled 2017-06-19: qty 1

## 2017-06-19 MED ORDER — SODIUM CHLORIDE 0.9 % IV SOLN
1.0000 g | Freq: Once | INTRAVENOUS | Status: AC
  Administered 2017-06-19: 1 g via INTRAVENOUS
  Filled 2017-06-19: qty 10

## 2017-06-19 MED ORDER — COLLAGENASE 250 UNIT/GM EX OINT
TOPICAL_OINTMENT | Freq: Every day | CUTANEOUS | Status: DC
  Administered 2017-06-19: 17:00:00 via TOPICAL
  Administered 2017-06-20: 1 via TOPICAL
  Administered 2017-06-22 – 2017-06-25 (×4): via TOPICAL
  Administered 2017-06-27: 1 via TOPICAL
  Administered 2017-06-28 – 2017-06-29 (×2): via TOPICAL
  Filled 2017-06-19 (×2): qty 30

## 2017-06-19 MED ORDER — INSULIN ASPART 100 UNIT/ML IV SOLN
10.0000 [IU] | Freq: Once | INTRAVENOUS | Status: AC
  Administered 2017-06-19: 10 [IU] via INTRAVENOUS

## 2017-06-19 MED ORDER — SODIUM POLYSTYRENE SULFONATE 15 GM/60ML PO SUSP
45.0000 g | Freq: Once | ORAL | Status: AC
Start: 2017-06-19 — End: 2017-06-19
  Administered 2017-06-19: 45 g via ORAL
  Filled 2017-06-19: qty 180

## 2017-06-19 NOTE — Progress Notes (Signed)
   Progress Note  Patient Name: Jill [name] Date of Encounter: 06/19/2017  Please see echocardiogram report. LVEF is mildly reduced at 45-50% with diffuse hypokinesis and there is moderate diastolic dysfunction with increased filling pressures. 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 Mechanism of her mitral regurgitation is not entirely clear, no obvious leaflet incompetence or substantial annular dilatation. Would continue to work on stabilizing her electrolytes and glucose control. When she is more stable would suggest proceeding with TEE next week and then base further cardiac workup from there.  Signed, Nona Dell, MD  06/19/2017, 3:39 PM

## 2017-06-19 NOTE — Progress Notes (Addendum)
PROGRESS NOTE  Jill [name] HEK:[id number] DOB: 06/28/1961 DOA: 06/18/2017 PCP: Health, [facility]  Brief History:  56 y.o. female with medical history of essential hypertension, diabetes mellitus, hyperlipidemia, diabetic peripheral neuropathy presented after instructed by the [facility] health Department to present to the emergency department secondary to elevated potassium. The patient was seen at the health department on 06/17/2017 secondary to diabetic foot ulcer. Apparently, routine blood work was obtained. The patient was contacted on the morning of 06/18/2017 to report to the emergency department secondary to elevated potassium.  The patient states that she has been eating a lot of fruits including grapes, peaches, strawberries, and tomatoes. However, the patient denies using any salt substitutes or taking any over-the-counter supplements. She denies any new medications other than the cephalexin that was started on 06/17/2017. The patient states that she has been taking ibuprofen 800 mg, 1 tablet daily for the past 3-4 weeks as well as when necessary for joint pains.  During my evaluation, the patient also mentioned that she's been having some "chest burning" for the past several months when she has been taking walks with her family. She has also been expressing some dyspnea on exertion and orthopnea type symptoms.  The patient was started on temporizing measures for her hyperkalemia, and she was given Kayexalate.  Assessment/Plan: Hyperkalemia  -Likely multifactorial including medications (lisinopril, ibuprofen), AKI, exogenous intake from food, and RTA type 4 -K is improving but remains elevated -repeat kayexalate -D50W, insulin IV, bicarb IV, Ca gluconate IV  CKD stage 3 -pt has progression of disease since 2017 -new baseline 1.1-1.4 -Discontinue lisinopril, ibuprofen  -Check CPK  98 -Renal ultrasound   Chest discomfort/Elevated troponin -troponins  flat -?component of fluid overload -consult cardiology -Echocardiogram --pending -CXR-personally reviewed--pulm venous congestion -continue ASA  Diabetes mellitus type 2 with neuropathy -Continue Levemir at reduced dose -NovoLog sliding scale -Monitor CBGs and adjust insulin -Hemoglobin A1c -Continue gabapentin -holding janumet  Hyperlipidemia -Continue statin  Diabetic foot ulcers -Do not appear to be infected on clinical exam -consult wound care -d/c cephlexin   Disposition Plan:   Home when K improved and cleared by cards Family Communication:   No Family at bedside  Consultants: cardiology   Code Status:  FULL   DVT Prophylaxis:  [place] Lovenox   Procedures: As Listed in Progress Note Above  Antibiotics: None    Subjective: Patient denies fevers, chills, headache, chest pain, dyspnea, nausea, vomiting, diarrhea, abdominal pain, dysuria, hematuria, hematochezia, and melena.   Objective: Vitals:   06/18/17 2300 06/19/17 0000 06/19/17 0020 06/19/17 0100  BP: (!) 101/50 (!) 100/50  (!) 86/55  Pulse:  94  93  Resp: 14 14  16   Temp:   98 F (36.7 C)   TempSrc:   Oral   SpO2: 99% 98%  92%  Weight:      Height:        Intake/Output Summary (Last 24 hours) at 06/19/17 0910 Last data filed at 06/19/17 0200  Gross per 24 hour  Intake            412.5 ml  Output                0 ml  Net            412.5 ml   Weight change:  Exam:   General:  Pt is alert, follows commands appropriately, not in acute distress  HEENT: No icterus, No thrush, No  neck mass, [place]/AT  Cardiovascular: RRR, S1/S2, no rubs, no gallops  Respiratory: bibasilar crackles, no wheeze  Abdomen: Soft/+BS, non tender, non distended, no guarding  Extremities: trace LE edema, No lymphangitis, No petechiae, No rashes, no synovitis   Data Reviewed: I have personally reviewed following labs and imaging studies Basic Metabolic Panel:  Recent Labs Lab 06/18/17 1057 06/18/17 1221  06/18/17 1344 06/18/17 1632 06/18/17 2032 06/19/17 0206  NA 137  --  142  --  142 141  K >7.5* >7.5* 7.6*  --  5.5* 6.0*  CL 115*  --  119*  --  115* 115*  CO2 18*  --   --   --  20* 21*  GLUCOSE 195*  --  141* 141* 176* 213*  BUN 46*  --  41*  --  41* 41*  CREATININE 1.47*  --  1.40*  --  1.46* 1.29*  CALCIUM 8.6*  --   --   --  8.8* 8.4*   Liver Function Tests:  Recent Labs Lab 06/18/17 1057  AST 20  ALT 21  ALKPHOS 75  BILITOT 0.2*  PROT 7.1  ALBUMIN 3.4*   No results for input(s): LIPASE, AMYLASE in the last 168 hours. No results for input(s): AMMONIA in the last 168 hours. Coagulation Profile: No results for input(s): INR, PROTIME in the last 168 hours. CBC:  Recent Labs Lab 06/18/17 1057 06/18/17 1344  WBC 6.5  --   NEUTROABS 2.8  --   HGB 9.5* 10.2*  HCT 30.3* 30.0*  MCV 84.4  --   PLT 267  --    Cardiac Enzymes:  Recent Labs Lab 06/18/17 2032 06/19/17 0206  CKTOTAL 98  --   TROPONINI 0.30* 0.26*   BNP: Invalid input(s): POCBNP CBG:  Recent Labs Lab 06/18/17 2128 06/19/17 0737  GLUCAP 170* 143*   HbA1C: No results for input(s): HGBA1C in the last 72 hours. Urine analysis:    Component Value Date/Time   COLORURINE YELLOW 05/19/2016 1200   APPEARANCEUR CLEAR 05/19/2016 1200   LABSPEC 1.015 05/19/2016 1200   PHURINE 5.5 05/19/2016 1200   GLUCOSEU 250 (A) 05/19/2016 1200   HGBUR NEGATIVE 05/19/2016 1200   BILIRUBINUR NEGATIVE 05/19/2016 1200   KETONESUR NEGATIVE 05/19/2016 1200   PROTEINUR NEGATIVE 05/19/2016 1200   UROBILINOGEN 0.2 05/20/2013 0943   NITRITE NEGATIVE 05/19/2016 1200   LEUKOCYTESUR SMALL (A) 05/19/2016 1200   Sepsis Labs: @LABRCNTIP (procalcitonin:4,lacticidven:4) )No results found for this or any previous visit (from the past 240 hour(s)).   Scheduled Meds: . aspirin EC  81 mg Oral Daily  . atorvastatin  40 mg Oral QHS  . dextrose  1 ampule Intravenous Once  . enoxaparin (LOVENOX) injection  50 mg Subcutaneous  Q24H  . gabapentin  400 mg Oral TID  . insulin aspart  0-15 Units Subcutaneous TID WC  . insulin aspart  0-5 Units Subcutaneous QHS  . insulin aspart  10 Units Intravenous Once  . insulin detemir  15 Units Subcutaneous BID  . loratadine  10 mg Oral Daily  . rosuvastatin  20 mg Oral Daily  . sodium bicarbonate  50 mEq Intravenous Once  . sodium polystyrene  45 g Oral Once   Continuous Infusions: . calcium gluconate      Procedures/Studies: Dg Chest 2 View  Result Date: 06/18/2017 CLINICAL DATA:  Shortness of breath EXAM: CHEST  2 VIEW COMPARISON:  03/06/2016 FINDINGS: Low lung volumes. Tiny pleural effusion. Borderline cardiomegaly. Mild perihilar interstitial opacity. No pneumothorax. IMPRESSION: 1. Tiny  pleural effusions 2. Borderline cardiomegaly Electronically Signed   By: Jasmine Pang M.D.   On: 06/18/2017 23:26   Dg Os Calcis Right  Result Date: 06/04/2017 CLINICAL DATA:  Diabetic foot ulcer on heel. EXAM: RIGHT OS CALCIS - 2+ VIEW COMPARISON:  None. FINDINGS: There is no evidence of fracture or other focal bone lesions. Vascular calcifications are noted. No radiopaque foreign body is noted. No definite evidence of large ulceration is noted. IMPRESSION: No definite lytic destruction is seen in the visualized bone to suggest osteomyelitis. Electronically Signed   By: Lupita Raider, M.D.   On: 06/04/2017 09:48    Kasie Leccese, DO  [facility] Pager [phone]  If 7PM-7AM, please contact night-coverage [email or web address] Password [email or web address] 06/19/2017, 9:10 AM   LOS: 0 days

## 2017-06-19 NOTE — Plan of Care (Signed)
Problem: Physical Regulation: Goal: Diagnostic test results will improve Outcome: Progressing Discussed  HbA1c and spanish handout provided.

## 2017-06-19 NOTE — Plan of Care (Signed)
Problem: Tissue Perfusion: Goal: Risk factors for ineffective tissue perfusion will decrease Outcome: Progressing Pt receiving lovenox for DVT prevention   

## 2017-06-19 NOTE — Progress Notes (Signed)
*  PRELIMINARY RESULTS* Echocardiogram 2D Echocardiogram has been performed.  Jeryl Columbia 06/19/2017, 2:55 PM

## 2017-06-19 NOTE — Consult Note (Signed)
Cardiology Consultation:   Patient ID: Jill [name]; [id number]; 1961-04-17   Admit date: 06/18/2017 Date of Consult: 06/19/2017  Primary Care Provider: [facility], [facility] Consulting Cardiologist: Dr. Jonelle Sidle   Patient Profile:   Jill [name] is a 56 y.o. female with a history of Type 2 diabetes mellitus with peripheral neuropathy, hypertension, and hyperlipidemia who is being seen today for the evaluation of exertional shortness of breath and chest discomfort at the request of Dr. Arbutus Leas.  History of Present Illness:   Jill [name] is currently admitted to the hospital having been referred via the health department secondary to finding of hyperkalemia (greater than 7.5) on routine lab work. She has been treated there recently due to a diabetic foot ulcer. She reports chronic leg pain which she attributes to arthritis, also has diabetic neuropathy however. She mentioned to Dr. Arbutus Leas that she had been experiencing dyspnea on exertion and exertional chest discomfort when she walks outside, particularly up hill. This has been going on for several months by her account to me today. She has no known history of ischemic heart disease or cardiomyopathy.   Outpatient medications included HCTZ and lisinopril, both of been discontinued in light of her hyperkalemia and renal insufficiency.  She is currently unemployed and has no Programmer, applications. She states that diabetes mellitus does run in her family, unclear about any history of premature CAD.  Past Medical History:  Diagnosis Date  . Diabetic neuropathy (HCC)   . Hyperlipidemia   . Hypertension   . Type 2 diabetes mellitus (HCC)     Past Surgical History:  Procedure Laterality Date  . TUBAL LIGATION       Inpatient Medications: Scheduled Meds: . aspirin EC  81 mg Oral Daily  . atorvastatin  40 mg Oral QHS  . dextrose  1 ampule Intravenous Once  . enoxaparin (LOVENOX) injection  50 mg Subcutaneous Q24H  .  gabapentin  400 mg Oral TID  . insulin aspart  0-15 Units Subcutaneous TID WC  . insulin aspart  0-5 Units Subcutaneous QHS  . insulin aspart  10 Units Intravenous Once  . insulin detemir  15 Units Subcutaneous BID  . loratadine  10 mg Oral Daily  . rosuvastatin  20 mg Oral Daily  . sodium bicarbonate  50 mEq Intravenous Once  . sodium polystyrene  45 g Oral Once   Continuous Infusions: . calcium gluconate     PRN Meds: acetaminophen **OR** acetaminophen, ondansetron **OR** ondansetron (ZOFRAN) IV  Allergies:   No Known Allergies  Social History:   Social History   Social History  . Marital status: Significant Other    Spouse name: N/A  . Number of children: N/A  . Years of education: N/A   Occupational History  . Not on file.   Social History Main Topics  . Smoking status: Never Smoker  . Smokeless tobacco: Never Used  . Alcohol use No  . Drug use: No  . Sexual activity: Yes    Birth control/ protection: Surgical   Other Topics Concern  . Not on file   Social History Narrative  . No narrative on file    Family History:   The patient's family history includes Diabetes type II in her other; Hypertension in her other.  ROS:  Please see the history of present illness. Reports generalized fatigue. No definitive orthopnea or PND. Intermittent insomnia. All other ROS reviewed and negative.     Physical Exam/Data:   Vitals:   06/18/17  2300 06/19/17 0000 06/19/17 0020 06/19/17 0100  BP: (!) 101/50 (!) 100/50  (!) 86/55  Pulse:  94  93  Resp: 14 14  16   Temp:   98 F (36.7 C)   TempSrc:   Oral   SpO2: 99% 98%  92%  Weight:      Height:        Intake/Output Summary (Last 24 hours) at 06/19/17 0938 Last data filed at 06/19/17 0200  Gross per 24 hour  Intake            412.5 ml  Output                0 ml  Net            412.5 ml   Filed Weights   06/18/17 0954 06/18/17 2036  Weight: 223 lb (101.2 kg) 222 lb 9.6 oz (101 kg)   Body mass index is 40.71  kg/m.   Gen: Morbidly obese woman. Patient appears comfortable at rest. HEENT: Conjunctiva and lids normal, oropharynx clear. Neck: Supple, elevated JVP, no carotid bruits, no thyromegaly. Lungs: Clear to auscultation, no wheezing, nonlabored breathing at rest. Cardiac: Regular rate and rhythm, possible soft S3, soft systolic murmur, no pericardial rub. Abdomen: Obese, nontender, bowel sounds present, no guarding or rebound. Extremities: No pitting edema, distal pulses 2+. Skin: Warm and dry. Diabetic foot ulcers noted. Musculoskeletal: No kyphosis. Neuropsychiatric: Alert and oriented x3, affect grossly appropriate.  EKG:  I personally reviewed the tracing from 06/18/2017 which shows sinus rhythm with nonspecific diffuse ST-T wave changes.  Telemetry:  I personally reviewed telemetry which shows sinus rhythm.  Laboratory Data:  Chemistry Recent Labs Lab 06/18/17 1057  06/18/17 1344 06/18/17 1632 06/18/17 2032 06/19/17 0206  NA 137  --  142  --  142 141  K >7.5*  < > 7.6*  --  5.5* 6.0*  CL 115*  --  119*  --  115* 115*  CO2 18*  --   --   --  20* 21*  GLUCOSE 195*  --  141* 141* 176* 213*  BUN 46*  --  41*  --  41* 41*  CREATININE 1.47*  --  1.40*  --  1.46* 1.29*  CALCIUM 8.6*  --   --   --  8.8* 8.4*  GFRNONAA 39*  --   --   --  39* 45*  GFRAA 45*  --   --   --  45* 53*  ANIONGAP 4*  --   --   --  7 5  < > = values in this interval not displayed.   Recent Labs Lab 06/18/17 1057  PROT 7.1  ALBUMIN 3.4*  AST 20  ALT 21  ALKPHOS 75  BILITOT 0.2*   Hematology  Recent Labs Lab 06/18/17 1057 06/18/17 1344  WBC 6.5  --   RBC 3.59*  --   HGB 9.5* 10.2*  HCT 30.3* 30.0*  MCV 84.4  --   MCH 26.5  --   MCHC 31.4  --   RDW 15.0  --   PLT 267  --    Cardiac Enzymes  Recent Labs Lab 06/18/17 2032 06/19/17 0206  TROPONINI 0.30* 0.26*   No results for input(s): TROPIPOC in the last 168 hours.   Radiology/Studies:  Dg Chest 2 View  Result Date:  06/18/2017 CLINICAL DATA:  Shortness of breath EXAM: CHEST  2 VIEW COMPARISON:  03/06/2016 FINDINGS: Low lung volumes. Tiny pleural effusion. Borderline cardiomegaly.  Mild perihilar interstitial opacity. No pneumothorax. IMPRESSION: 1. Tiny pleural effusions 2. Borderline cardiomegaly Electronically Signed   By: Jasmine Pang M.D.   On: 06/18/2017 23:26    Assessment and Plan:   1. Exertional dyspnea and intermittent chest discomfort reported over the last several months per patient. ECG shows diffuse nonspecific ST-T wave abnormalities. She has a history of hypertension, hyperlipidemia, and type 2 diabetes mellitus with peripheral neuropathy and diabetic foot ulcers raising her likelihood of underlying ischemic heart disease. She has no history of cardiomyopathy, echocardiogram is pending.  2. Mild troponin I elevation in flat pattern. This is less consistent with ACS, could be related to cardiomyopathy or myocardial strain with fluid overload however. Echocardiogram is pending.  3. Presentation with hyperkalemia, being managed by the primary service. She has been taken off of lisinopril and HCTZ. She was also taking ibuprofen regularly and this has been discontinued. Question of some relation to high potassium foods but may also have RTA type IV. Renal ultrasound pending.  4. Type 2 diabetes mellitus with peripheral neuropathy and diabetic foot ulcers. Presently on sliding scale insulin. Janumet has been held.  5. Hyperlipidemia, on statin therapy.  6. Likely CKD stage 3. Creatinine 1.3-1.4.  Follow-up echocardiogram which has already been ordered for assessment of cardiac structure and function. Continue aspirin and Lipitor. Would recommend stabilizing electrolyte status and diabetes control first. She will need further ischemic evaluation to follow. If she has a definitive cardiomyopathy, consideration can be given to diagnostic cardiac catheterization in light of her symptoms. If LVEF is  normal, then a Myoview would be reasonable to assess ischemic burden and guide further treatment options. We will follow with you and make additional recommendations.  Signed, Nona Dell, MD  06/19/2017 9:38 AM

## 2017-06-19 NOTE — Consult Note (Addendum)
WOC Nurse wound consult note Reason for Consult: Consult requested for right foot wounds; performed via remote camera with assistance from the bedside nurse for assessment and measurements. Wound type: Right heel with unstageable pressure injury; 1.5X2cm, 100% yellow dry slough.  No odor, drainage, or fluctuance. Space between 1st and 2nd toes with full thickness wound; 3X.25X.5cm, moist reddish yellow wound bed, no odor, small amt tan drainage, slightly fluctuant when probed with a swab. Pressure Injury POA: Yes Dressing procedure/placement/frequency: Float heel to reduce pressure.  Santyl ointment for enzymatic debridement of nonviable tissue to wounds.  Discussed plan of care with patient and bedside nurse and orders for topical treatment were provided. Please re-consult if further assistance is needed.  Thank-you,  Cammie Mcgee MSN, RN, CWOCN, [place], CNS [phone]

## 2017-06-20 ENCOUNTER — Observation Stay (HOSPITAL_COMMUNITY): Payer: Medicaid Other

## 2017-06-20 DIAGNOSIS — I429 Cardiomyopathy, unspecified: Secondary | ICD-10-CM

## 2017-06-20 DIAGNOSIS — I428 Other cardiomyopathies: Secondary | ICD-10-CM

## 2017-06-20 LAB — BASIC METABOLIC PANEL
Anion gap: 4 — ABNORMAL LOW (ref 5–15)
BUN: 34 mg/dL — ABNORMAL HIGH (ref 6–20)
CHLORIDE: 116 mmol/L — AB (ref 101–111)
CO2: 23 mmol/L (ref 22–32)
CREATININE: 0.8 mg/dL (ref 0.44–1.00)
Calcium: 8.2 mg/dL — ABNORMAL LOW (ref 8.9–10.3)
Glucose, Bld: 88 mg/dL (ref 65–99)
POTASSIUM: 4.9 mmol/L (ref 3.5–5.1)
SODIUM: 143 mmol/L (ref 135–145)

## 2017-06-20 LAB — HIV ANTIBODY (ROUTINE TESTING W REFLEX): HIV SCREEN 4TH GENERATION: NONREACTIVE

## 2017-06-20 LAB — GLUCOSE, CAPILLARY
GLUCOSE-CAPILLARY: 97 mg/dL (ref 65–99)
GLUCOSE-CAPILLARY: 253 mg/dL — AB (ref 65–99)
Glucose-Capillary: 124 mg/dL — ABNORMAL HIGH (ref 65–99)
Glucose-Capillary: 148 mg/dL — ABNORMAL HIGH (ref 65–99)

## 2017-06-20 LAB — MAGNESIUM: MAGNESIUM: 2.1 mg/dL (ref 1.7–2.4)

## 2017-06-20 MED ORDER — LIVING WELL WITH DIABETES BOOK
Freq: Once | Status: AC
  Administered 2017-06-20: 17:00:00
  Filled 2017-06-20: qty 1

## 2017-06-20 NOTE — Progress Notes (Signed)
Inpatient Diabetes Program Recommendations  AACE/ADA: New Consensus Statement on Inpatient Glycemic Control (2015)  Target Ranges:  Prepandial:   less than 140 mg/dL      Peak postprandial:   less than 180 mg/dL (1-2 hours)      Critically ill patients:  140 - 180 mg/dL   Lab Results  Component Value Date   GLUCAP 97 06/20/2017   HGBA1C 10.4 (H) 06/18/2017    Review of Glycemic Control Results for [name], [name] (MRN [id number]) as of 06/20/2017 08:21  Ref. Range 06/19/2017 07:37 06/19/2017 11:56 06/19/2017 16:24 06/19/2017 20:44 06/20/2017 07:01  Glucose-Capillary Latest Ref Range: 65 - 99 mg/dL 280 (H) 73 034 (H) 917 (H) 97   Diabetes history: DM2 Outpatient Diabetes medications: Levemir 25 units bid + Trulicity 0.75 qd + Janumet (sitagliptin + Metformin) bid Current orders for Inpatient glycemic control: Levemir 15 qd + Novolog moderate correction scale + 0-5 units hs  Inpatient Diabetes Program Recommendations:  Noted potassium now 4.9. Patient's A1c 03/05/16 12.6 and now 10.4. Outpatient diabetes available @ [facility] free of charge by calling hospital and signing up. Will follow.  Thank you, Jill [name]. Magally Vahle, RN, MSN, CDE  Diabetes Coordinator Inpatient Glycemic Control Team Team Pager [phone] (8am-5pm) 06/20/2017 8:33 AM

## 2017-06-20 NOTE — Progress Notes (Signed)
PROGRESS NOTE  Jill [name] ZOX:[id number] DOB: [birth year] DOA: 06/18/2017 PCP: [facility]  Brief History:  56 y.o.femalewith medical history of essential hypertension, diabetes mellitus, hyperlipidemia, diabetic peripheral neuropathy presented after instructed by the [facility] health Department to present to the emergency department secondary to elevated potassium. The patient was seen at the health department on 06/17/2017 secondary to diabetic foot ulcer. Apparently, routine blood work was obtained. The patient was contacted on the morning of 06/18/2017 to report to the emergency department secondary to elevated potassium. The patient states that she has been eating a lot of fruits including grapes, peaches, strawberries, and tomatoes. However, the patient denies using any salt substitutes or taking any over-the-counter supplements. She denies any new medications other than the cephalexin that was started on 06/17/2017. The patient states that she has been taking ibuprofen 800 mg, 1 tablet daily for the past 3-4 weeks as well as when necessary for joint pains. During my evaluation, the patient also mentioned that she's been having some "chest burning" for the past several months when she has been taking walks with her family. She has also been expressing some dyspnea on exertion and orthopnea type symptoms.  The patient was started on temporizing measures for her hyperkalemia, and she was given Kayexalate.  Assessment/Plan: Hyperkalemia  -Likely multifactorial including medications (lisinopril, ibuprofen), AKI, exogenous intake from food, and RTA type 4 -K is improving -am BMP  Cardiomyopathy--newly diagnosed -06/19/17 Echo--EF 45-at the percent, grade 2 DD, moderate to severe MR, PASP 62 -may be due to mitral regurg vs ischemic -appreciate cardiology eval-->plan for TEE 9/3 or 9/4  Hypoxia -repeat CXR  CKD stage 3 -pt has progression of  disease since 2017 -new baseline 1.1-1.4 -Discontinue lisinopril, ibuprofen  -Check CPK  98 -Renal ultrasound--neg for hydronephrosis  Chest discomfort/Elevated troponin -troponins flat -?component of fluid overload vs cardiomyopathy -appreciate  -Echocardiogram --pending -CXR-personally reviewed--pulm venous congestion -continue ASA  Diabetes mellitus type 2 with neuropathy -Continue Levemir at reduced dose -NovoLog sliding scale -Monitor CBGs and adjust insulin -Hemoglobin A1c--10.4 -Continue gabapentin -holding janumet  Hyperlipidemia -Continue statin  Diabetic foot ulcers -Do not appear to be infected on clinical exam -wound care consult appreciate -d/c cephlexin   Disposition Plan:   Home when K improved and cleared by cards Family Communication:   Spouse updated at bedside  Consultants: cardiology   Code Status:  FULL   DVT Prophylaxis:  [place] Lovenox   Procedures: As Listed in Progress Note Above  Antibiotics: None   Subjective: Patient denies fevers, chills, headache, chest pain, dyspnea, nausea, vomiting, diarrhea, abdominal pain, dysuria, hematuria, hematochezia, and melena.   Objective: Vitals:   06/20/17 0700 06/20/17 0721 06/20/17 0800 06/20/17 0900  BP: (!) 132/107  113/63 132/66  Pulse: 86 82 79 88  Resp: 17 17 15 15   Temp:  98.2 F (36.8 C)    TempSrc:  Axillary    SpO2: 94% 96% 93% 94%  Weight:      Height:        Intake/Output Summary (Last 24 hours) at 06/20/17 0950 Last data filed at 06/20/17 0800  Gross per 24 hour  Intake              940 ml  Output                0 ml  Net              940 ml  Weight change: 1.648 kg (3 lb 10.1 oz) Exam:   General:  Pt is alert, follows commands appropriately, not in acute distress  HEENT: No icterus, No thrush, No neck mass, [place]/AT  Cardiovascular: RRR, S1/S2, no rubs, no gallops  Respiratory: bibasilar crackles, no wheeze  Abdomen: Soft/+BS, non tender, non  distended, no guarding  Extremities: trace edema, No lymphangitis, No petechiae, No rashes, no synovitis   Data Reviewed: I have personally reviewed following labs and imaging studies Basic Metabolic Panel:  Recent Labs Lab 06/18/17 1057  06/18/17 1344 06/18/17 1632 06/18/17 2032 06/19/17 0206 06/19/17 1619 06/20/17 0436  NA 137  --  142  --  142 141  --  143  K >7.5*  < > 7.6*  --  5.5* 6.0* 5.1 4.9  CL 115*  --  119*  --  115* 115*  --  116*  CO2 18*  --   --   --  20* 21*  --  23  GLUCOSE 195*  --  141* 141* 176* 213*  --  88  BUN 46*  --  41*  --  41* 41*  --  34*  CREATININE 1.47*  --  1.40*  --  1.46* 1.29*  --  0.80  CALCIUM 8.6*  --   --   --  8.8* 8.4*  --  8.2*  MG  --   --   --   --   --   --   --  2.1  < > = values in this interval not displayed. Liver Function Tests:  Recent Labs Lab 06/18/17 1057  AST 20  ALT 21  ALKPHOS 75  BILITOT 0.2*  PROT 7.1  ALBUMIN 3.4*   No results for input(s): LIPASE, AMYLASE in the last 168 hours. No results for input(s): AMMONIA in the last 168 hours. Coagulation Profile: No results for input(s): INR, PROTIME in the last 168 hours. CBC:  Recent Labs Lab 06/18/17 1057 06/18/17 1344  WBC 6.5  --   NEUTROABS 2.8  --   HGB 9.5* 10.2*  HCT 30.3* 30.0*  MCV 84.4  --   PLT 267  --    Cardiac Enzymes:  Recent Labs Lab 06/18/17 2032 06/19/17 0206 06/19/17 0811  CKTOTAL 98  --   --   TROPONINI 0.30* 0.26* 0.25*   BNP: Invalid input(s): POCBNP CBG:  Recent Labs Lab 06/19/17 0737 06/19/17 1156 06/19/17 1624 06/19/17 2044 06/20/17 0701  GLUCAP 143* 73 182* 219* 97   HbA1C:  Recent Labs  06/18/17 1057  HGBA1C 10.4*   Urine analysis:    Component Value Date/Time   COLORURINE YELLOW 05/19/2016 1200   APPEARANCEUR CLEAR 05/19/2016 1200   LABSPEC 1.015 05/19/2016 1200   PHURINE 5.5 05/19/2016 1200   GLUCOSEU 250 (A) 05/19/2016 1200   HGBUR NEGATIVE 05/19/2016 1200   BILIRUBINUR NEGATIVE  05/19/2016 1200   KETONESUR NEGATIVE 05/19/2016 1200   PROTEINUR NEGATIVE 05/19/2016 1200   UROBILINOGEN 0.2 05/20/2013 0943   NITRITE NEGATIVE 05/19/2016 1200   LEUKOCYTESUR SMALL (A) 05/19/2016 1200   Sepsis Labs: @LABRCNTIP (procalcitonin:4,lacticidven:4) )No results found for this or any previous visit (from the past 240 hour(s)).   Scheduled Meds: . aspirin EC  81 mg Oral Daily  . atorvastatin  40 mg Oral QHS  . collagenase   Topical Daily  . enoxaparin (LOVENOX) injection  50 mg Subcutaneous Q24H  . gabapentin  400 mg Oral TID  . insulin aspart  0-15 Units Subcutaneous TID WC  . insulin  aspart  0-5 Units Subcutaneous QHS  . insulin detemir  15 Units Subcutaneous BID  . living well with diabetes book   Does not apply Once  . living well with diabetes book- in spanish   Does not apply Once  . loratadine  10 mg Oral Daily  . rosuvastatin  20 mg Oral Daily   Continuous Infusions:  Procedures/Studies: Dg Chest 2 View  Result Date: 06/18/2017 CLINICAL DATA:  Shortness of breath EXAM: CHEST  2 VIEW COMPARISON:  03/06/2016 FINDINGS: Low lung volumes. Tiny pleural effusion. Borderline cardiomegaly. Mild perihilar interstitial opacity. No pneumothorax. IMPRESSION: 1. Tiny pleural effusions 2. Borderline cardiomegaly Electronically Signed   By: Jasmine Pang M.D.   On: 06/18/2017 23:26   Dg Os Calcis Right  Result Date: 06/04/2017 CLINICAL DATA:  Diabetic foot ulcer on heel. EXAM: RIGHT OS CALCIS - 2+ VIEW COMPARISON:  None. FINDINGS: There is no evidence of fracture or other focal bone lesions. Vascular calcifications are noted. No radiopaque foreign body is noted. No definite evidence of large ulceration is noted. IMPRESSION: No definite lytic destruction is seen in the visualized bone to suggest osteomyelitis. Electronically Signed   By: Lupita Raider, M.D.   On: 06/04/2017 09:48   US Renal  Result Date: 06/19/2017 CLINICAL DATA:  Acute kidney injury, history hypertension, type  II diabetes mellitus EXAM: RENAL / URINARY TRACT ULTRASOUND COMPLETE COMPARISON:  None FINDINGS: Right Kidney: Length: 11.5 cm. Normal cortical thickness and echogenicity. No mass, hydronephrosis or shadowing calcification. Left Kidney: Length: 10.6 cm. Mildly lobulated renal contour, which can be a normal variant. Normal cortical thickness and echogenicity. No mass, hydronephrosis or shadowing calcification. Bladder: Only partially distended, grossly unremarkable. Ureteral jets were not visualized during the period of imaging. IMPRESSION: No renal sonographic abnormalities identified. Electronically Signed   By: Ulyses Southward M.D.   On: 06/19/2017 10:56    Ginni Eichler, DO  [facility] Pager [phone]  If 7PM-7AM, please contact night-coverage [email or web address] Password [email or web address] 06/20/2017, 9:50 AM   LOS: 0 days

## 2017-06-20 NOTE — Progress Notes (Signed)
Called report to [phone] RN who will be taking patient in room 326

## 2017-06-20 NOTE — Progress Notes (Signed)
Changed dressing on right foot with santyl and covered with gause and Kerlix. Placed new pair of yellow socks.

## 2017-06-21 LAB — GLUCOSE, CAPILLARY
GLUCOSE-CAPILLARY: 200 mg/dL — AB (ref 65–99)
Glucose-Capillary: 163 mg/dL — ABNORMAL HIGH (ref 65–99)
Glucose-Capillary: 235 mg/dL — ABNORMAL HIGH (ref 65–99)

## 2017-06-21 LAB — BASIC METABOLIC PANEL
Anion gap: 6 (ref 5–15)
BUN: 26 mg/dL — AB (ref 6–20)
CHLORIDE: 113 mmol/L — AB (ref 101–111)
CO2: 23 mmol/L (ref 22–32)
CREATININE: 0.92 mg/dL (ref 0.44–1.00)
Calcium: 7.9 mg/dL — ABNORMAL LOW (ref 8.9–10.3)
Glucose, Bld: 228 mg/dL — ABNORMAL HIGH (ref 65–99)
POTASSIUM: 4.8 mmol/L (ref 3.5–5.1)
SODIUM: 142 mmol/L (ref 135–145)

## 2017-06-21 NOTE — Progress Notes (Signed)
PROGRESS NOTE  Jill [name] RUE:[id number] DOB: August 14, 1961 DOA: 06/18/2017 PCP: Randell Patient [facility]  Brief History: 56 y.o.femalewith medical history of essential hypertension, diabetes mellitus, hyperlipidemia, diabetic peripheral neuropathy presented after instructed by the [facility] health Department to present to the emergency department secondary to elevated potassium. The patient was seen at the health department on 06/17/2017 secondary to diabetic foot ulcer. Apparently, routine blood work was obtained. The patient was contacted on the morning of 06/18/2017 to report to the emergency department secondary to elevated potassium. The patient states that she has been eating a lot of fruits including grapes, peaches, strawberries, and tomatoes. However, the patient denies using any salt substitutes or taking any over-the-counter supplements. She denies any new medications other than the cephalexin that was started on 06/17/2017. The patient states that she has been taking ibuprofen 800 mg, 1 tablet daily for the past 3-4 weeks as well as when necessary for joint pains. During my evaluation, the patient also mentioned that she's been having some "chest burning" for the past several months when she has been taking walks with her family. She has also been expressing some dyspnea on exertion and orthopnea type symptoms. The patient was started on temporizing measures for her hyperkalemia, and she was given Kayexalate.  Assessment/Plan: Hyperkalemia  -Likely multifactorial including medications (lisinopril, ibuprofen), AKI, exogenous intake from food, and RTA type 4 -K is improved -consult dietician to help with education for avoiding K rich foods -am BMP  Cardiomyopathy--newly diagnosed/pulmonary hypertension -06/19/17 Echo--EF 45-at the percent, grade 2 DD, moderate to severe MR, PASP 62 -may be due to mitral regurg vs ischemic -appreciate cardiology  eval-->plan for TEE 9/3 or 9/4  Hypoxia -stable/improved -personally reviewed repeat CXR--interstitial prominence -pt denies any SOB -oxygen saturation 95-96% on RA  CKD stage 3 -pt has progression of disease since 2017 -new baseline 1.1-1.4 -Discontinue lisinopril, ibuprofen  -Check CPK 98 -Renal ultrasound--neg for hydronephrosis  Chest discomfort/Elevated troponin -troponins flat -?component of fluid overload vs cardiomyopathy -appreciate  -Echocardiogram --EF 45-50 percent, grade 2 DD, mild to severe MR, mild TR, PASP 62 -CXR-personally reviewed--pulm venous congestion -continue ASA  Diabetes mellitus type 2 with neuropathy -Continue Levemir at reduced dose -NovoLog sliding scale -Monitor CBGs and adjust insulin -Hemoglobin A1c--10.4 -Continue gabapentin -holding janumet  Hyperlipidemia -Continue statin  Diabetic foot ulcers -Do not appear to be infected on clinical exam -wound care consult appreciate -d/c cephlexin  Right lower extremity edema -venous duplex   Disposition Plan: Home when K improved and cleared by cards Family Communication: Spouse updated at bedside  Consultants: cardiology  Code Status: FULL   DVT Prophylaxis: [place] Lovenox   Procedures: As Listed in Progress Note Above  Antibiotics: None    Subjective: Patient denies fevers, chills, headache, chest pain, dyspnea, nausea, vomiting, diarrhea, abdominal pain, dysuria, hematuria, hematochezia, and melena.   Objective: Vitals:   06/20/17 0800 06/20/17 0900 06/20/17 2211 06/21/17 0703  BP: 113/63 132/66 139/66 (!) 114/56  Pulse: 79 88 94 82  Resp: 15 15 20 20   Temp:   98.4 F (36.9 C) 98.3 F (36.8 C)  TempSrc:   Oral Oral  SpO2: 93% 94% 97% 93%  Weight:    101 kg (222 lb 10.6 oz)  Height:        Intake/Output Summary (Last 24 hours) at 06/21/17 1309 Last data filed at 06/21/17 1254  Gross per 24 hour  Intake  840 ml  Output                 0 ml  Net              840 ml   Weight change:  Exam:   General:  Pt is alert, follows commands appropriately, not in acute distress  HEENT: No icterus, No thrush, No neck mass, [place]/AT  Cardiovascular: RRR, S1/S2, no rubs, no gallops  Respiratory: bibasilar crackles, no wheeze  Abdomen: Soft/+BS, non tender, non distended, no guarding  Extremities: 1 + RLE edema, No lymphangitis, No petechiae, No rashes, no synovitis   Data Reviewed: I have personally reviewed following labs and imaging studies Basic Metabolic Panel:  Recent Labs Lab 06/18/17 1057  06/18/17 1344 06/18/17 1632 06/18/17 2032 06/19/17 0206 06/19/17 1619 06/20/17 0436 06/21/17 0554  NA 137  --  142  --  142 141  --  143 142  K >7.5*  < > 7.6*  --  5.5* 6.0* 5.1 4.9 4.8  CL 115*  --  119*  --  115* 115*  --  116* 113*  CO2 18*  --   --   --  20* 21*  --  23 23  GLUCOSE 195*  --  141* 141* 176* 213*  --  88 228*  BUN 46*  --  41*  --  41* 41*  --  34* 26*  CREATININE 1.47*  --  1.40*  --  1.46* 1.29*  --  0.80 0.92  CALCIUM 8.6*  --   --   --  8.8* 8.4*  --  8.2* 7.9*  MG  --   --   --   --   --   --   --  2.1  --   < > = values in this interval not displayed. Liver Function Tests:  Recent Labs Lab 06/18/17 1057  AST 20  ALT 21  ALKPHOS 75  BILITOT 0.2*  PROT 7.1  ALBUMIN 3.4*   No results for input(s): LIPASE, AMYLASE in the last 168 hours. No results for input(s): AMMONIA in the last 168 hours. Coagulation Profile: No results for input(s): INR, PROTIME in the last 168 hours. CBC:  Recent Labs Lab 06/18/17 1057 06/18/17 1344  WBC 6.5  --   NEUTROABS 2.8  --   HGB 9.5* 10.2*  HCT 30.3* 30.0*  MCV 84.4  --   PLT 267  --    Cardiac Enzymes:  Recent Labs Lab 06/18/17 2032 06/19/17 0206 06/19/17 0811  CKTOTAL 98  --   --   TROPONINI 0.30* 0.26* 0.25*   BNP: Invalid input(s): POCBNP CBG:  Recent Labs Lab 06/20/17 0701 06/20/17 1246 06/20/17 1723 06/20/17 2051  06/21/17 1105  GLUCAP 97 253* 124* 148* 163*   HbA1C: No results for input(s): HGBA1C in the last 72 hours. Urine analysis:    Component Value Date/Time   COLORURINE YELLOW 05/19/2016 1200   APPEARANCEUR CLEAR 05/19/2016 1200   LABSPEC 1.015 05/19/2016 1200   PHURINE 5.5 05/19/2016 1200   GLUCOSEU 250 (A) 05/19/2016 1200   HGBUR NEGATIVE 05/19/2016 1200   BILIRUBINUR NEGATIVE 05/19/2016 1200   KETONESUR NEGATIVE 05/19/2016 1200   PROTEINUR NEGATIVE 05/19/2016 1200   UROBILINOGEN 0.2 05/20/2013 0943   NITRITE NEGATIVE 05/19/2016 1200   LEUKOCYTESUR SMALL (A) 05/19/2016 1200   Sepsis Labs: @LABRCNTIP (procalcitonin:4,lacticidven:4) )No results found for this or any previous visit (from the past 240 hour(s)).   Scheduled Meds: . aspirin EC  81  mg Oral Daily  . atorvastatin  40 mg Oral QHS  . collagenase   Topical Daily  . enoxaparin (LOVENOX) injection  50 mg Subcutaneous Q24H  . gabapentin  400 mg Oral TID  . insulin aspart  0-15 Units Subcutaneous TID WC  . insulin aspart  0-5 Units Subcutaneous QHS  . insulin detemir  15 Units Subcutaneous BID  . living well with diabetes book- in spanish   Does not apply Once  . loratadine  10 mg Oral Daily  . rosuvastatin  20 mg Oral Daily   Continuous Infusions:  Procedures/Studies: Dg Chest 2 View  Result Date: 06/20/2017 CLINICAL DATA:  Acute onset of shortness of breath and hypoxia earlier today. Hyperkalemia. Current history of hypertension, diabetes, hyperlipidemia. EXAM: CHEST  2 VIEW COMPARISON:  06/18/2017, 03/06/2016 and 03/05/2016. CTA chest 03/05/2016. FINDINGS: Suboptimal inspiration. Cardiac silhouette mildly enlarged, unchanged. Hilar and mediastinal contours otherwise unremarkable. Moderate diffuse interstitial pulmonary edema, new since the examination 2 days ago. Small bilateral pleural effusions, unchanged. No confluent airspace consolidation. Degenerative changes involving the thoracic and upper lumbar spine.  IMPRESSION: 1. Mild CHF and/or fluid overload, with acute moderate diffuse interstitial pulmonary edema which is new since 2 days ago. 2. Stable small bilateral pleural effusions. Electronically Signed   By: Hulan Saas M.D.   On: 06/20/2017 13:51   Dg Chest 2 View  Result Date: 06/18/2017 CLINICAL DATA:  Shortness of breath EXAM: CHEST  2 VIEW COMPARISON:  03/06/2016 FINDINGS: Low lung volumes. Tiny pleural effusion. Borderline cardiomegaly. Mild perihilar interstitial opacity. No pneumothorax. IMPRESSION: 1. Tiny pleural effusions 2. Borderline cardiomegaly Electronically Signed   By: Jasmine Pang M.D.   On: 06/18/2017 23:26   Dg Os Calcis Right  Result Date: 06/04/2017 CLINICAL DATA:  Diabetic foot ulcer on heel. EXAM: RIGHT OS CALCIS - 2+ VIEW COMPARISON:  None. FINDINGS: There is no evidence of fracture or other focal bone lesions. Vascular calcifications are noted. No radiopaque foreign body is noted. No definite evidence of large ulceration is noted. IMPRESSION: No definite lytic destruction is seen in the visualized bone to suggest osteomyelitis. Electronically Signed   By: Lupita Raider, M.D.   On: 06/04/2017 09:48   US Renal  Result Date: 06/19/2017 CLINICAL DATA:  Acute kidney injury, history hypertension, type II diabetes mellitus EXAM: RENAL / URINARY TRACT ULTRASOUND COMPLETE COMPARISON:  None FINDINGS: Right Kidney: Length: 11.5 cm. Normal cortical thickness and echogenicity. No mass, hydronephrosis or shadowing calcification. Left Kidney: Length: 10.6 cm. Mildly lobulated renal contour, which can be a normal variant. Normal cortical thickness and echogenicity. No mass, hydronephrosis or shadowing calcification. Bladder: Only partially distended, grossly unremarkable. Ureteral jets were not visualized during the period of imaging. IMPRESSION: No renal sonographic abnormalities identified. Electronically Signed   By: Ulyses Southward M.D.   On: 06/19/2017 10:56    Seymour Pavlak,  DO  [facility] Pager [phone]  If 7PM-7AM, please contact night-coverage [email or web address] Password [email or web address] 06/21/2017, 1:09 PM   LOS: 0 days

## 2017-06-22 ENCOUNTER — Observation Stay (HOSPITAL_COMMUNITY): Payer: Medicaid Other

## 2017-06-22 LAB — BASIC METABOLIC PANEL
ANION GAP: 5 (ref 5–15)
BUN: 25 mg/dL — AB (ref 6–20)
CALCIUM: 8.2 mg/dL — AB (ref 8.9–10.3)
CO2: 25 mmol/L (ref 22–32)
Chloride: 113 mmol/L — ABNORMAL HIGH (ref 101–111)
Creatinine, Ser: 0.83 mg/dL (ref 0.44–1.00)
GLUCOSE: 99 mg/dL (ref 65–99)
POTASSIUM: 4.6 mmol/L (ref 3.5–5.1)
SODIUM: 143 mmol/L (ref 135–145)

## 2017-06-22 LAB — GLUCOSE, CAPILLARY
GLUCOSE-CAPILLARY: 95 mg/dL (ref 65–99)
Glucose-Capillary: 198 mg/dL — ABNORMAL HIGH (ref 65–99)
Glucose-Capillary: 228 mg/dL — ABNORMAL HIGH (ref 65–99)
Glucose-Capillary: 188 mg/dL — ABNORMAL HIGH (ref 65–99)

## 2017-06-22 MED ORDER — INSULIN ASPART 100 UNIT/ML ~~LOC~~ SOLN
4.0000 [IU] | Freq: Three times a day (TID) | SUBCUTANEOUS | Status: DC
  Administered 2017-06-22 – 2017-06-29 (×13): 4 [IU] via SUBCUTANEOUS

## 2017-06-22 NOTE — Progress Notes (Signed)
Inpatient Diabetes Program Recommendations  AACE/ADA: New Consensus Statement on Inpatient Glycemic Control (2015)  Target Ranges:  Prepandial:   less than 140 mg/dL      Peak postprandial:   less than 180 mg/dL (1-2 hours)      Critically ill patients:  140 - 180 mg/dL   Lab Results  Component Value Date   GLUCAP 95 06/22/2017   HGBA1C 10.4 (H) 06/18/2017    Spoke with patient's son by phone and reviewed A1c 10.4. Also gave son information to call [facility] for outpatient education @ [facility] [phone] and to be sure to advise patient prefers Spanish language. Will follow.  Thank you, Billy Fischer. Eirik Schueler, RN, MSN, CDE  Diabetes Coordinator Inpatient Glycemic Control Team Team Pager [phone] (8am-5pm) 06/22/2017 11:49 AM

## 2017-06-22 NOTE — Progress Notes (Signed)
PROGRESS NOTE  Jill [name] MVH:[id number] DOB: Apr 02, 1961 DOA: 06/18/2017 PCP: Randell Patient [facility]  Brief History: 56 y.o.femalewith medical history of essential hypertension, diabetes mellitus, hyperlipidemia, diabetic peripheral neuropathy presented after instructed by the [facility] health Department to present to the emergency department secondary to elevated potassium. The patient was seen at the health department on 06/17/2017 secondary to diabetic foot ulcer. Apparently, routine blood work was obtained. The patient was contacted on the morning of 06/18/2017 to report to the emergency department secondary to elevated potassium. The patient states that she has been eating a lot of fruits including grapes, peaches, strawberries, and tomatoes. However, the patient denies using any salt substitutes or taking any over-the-counter supplements. She denies any new medications other than the cephalexin that was started on 06/17/2017. The patient states that she has been taking ibuprofen 800 mg, 1 tablet daily for the past 3-4 weeks as well as when necessary for joint pains. During my evaluation, the patient also mentioned that she's been having some "chest burning" for the past several months when she has been taking walks with her family. She has also been expressing some dyspnea on exertion and orthopnea type symptoms. The patient was started on temporizing measures for her hyperkalemia, and she was given Kayexalate.  Assessment/Plan: Hyperkalemia  -Likely multifactorial including medications (lisinopril, ibuprofen), AKI, exogenous intake from food, and RTA type 4 -K is improved -consult dietician to help with education for avoiding K rich foods -am BMP  Cardiomyopathy--newly diagnosed/pulmonary hypertension -06/19/17 Echo--EF 45-at the percent, grade 2 DD, moderate to severe MR, PASP 62 -may be due to mitral regurg vs ischemic -appreciate cardiology  eval-->plan for TEE  9/4 or 9/5  Hypoxia -stable/improved -personally reviewed repeat CXR--interstitial prominence -pt denies any SOB -oxygen saturation 95-96% on RA  CKD stage 3 -pt has progression of disease since 2017 -prior baseline 1.1-1.4 -Discontinue lisinopril, ibuprofen  -Check CPK 98 -Renal ultrasound--neg for hydronephrosis  Chest discomfort/Elevated troponin -troponins flat -?component of fluid overload vs cardiomyopathy -appreciate  -Echocardiogram --EF 45-50 percent, grade 2 DD, mild to severe MR, mild TR, PASP 62 -CXR-personally reviewed--pulm venous congestion -continue ASA  Diabetes mellitus type 2 with neuropathy -Continue Levemir at reduced dose -NovoLog sliding scale -Monitor CBGs and adjust insulin -add novolog 4 tiw -Hemoglobin A1c--10.4 -Continue gabapentin -holding janumet  Hyperlipidemia -Continue statin  Diabetic foot ulcers -Do not appear to be infected on clinical exam -wound care consult appreciate -d/c cephlexin  Right lower extremity edema -venous duplex--neg   Disposition Plan: Home when cleared by cards Family Communication: Son updatedat bedside at bedside  Consultants: cardiology  Code Status: FULL   DVT Prophylaxis: [place] Lovenox   Procedures: As Listed in Progress Note Above  Antibiotics: None    Subjective: Patient denies fevers, chills, headache, chest pain, dyspnea, nausea, vomiting, diarrhea, abdominal pain, dysuria, hematuria, hematochezia, and melena.   Objective: Vitals:   06/21/17 2142 06/22/17 0609 06/22/17 1424 06/22/17 1427  BP: (!) 125/58 114/86  131/71  Pulse: 79 84  91  Resp: 19 20  18   Temp: 98.2 F (36.8 C) 98.6 F (37 C)  98.5 F (36.9 C)  TempSrc: Oral Oral  Oral  SpO2: 96% 96% 91% 93%  Weight:  102.8 kg (226 lb 9.6 oz)    Height:        Intake/Output Summary (Last 24 hours) at 06/22/17 1457 Last data filed at 06/22/17 1227  Gross per 24 hour  Intake  1150 ml  Output                0 ml  Net             1150 ml   Weight change:  Exam:   General:  Pt is alert, follows commands appropriately, not in acute distress  HEENT: No icterus, No thrush, No neck mass, [place]/AT  Cardiovascular: RRR, S1/S2, no rubs, no gallops  Respiratory: bibasilar crackles. No wheeze  Abdomen: Soft/+BS, non tender, non distended, no guarding  Extremities: No edema, No lymphangitis, No petechiae, No rashes, no synovitis   Data Reviewed: I have personally reviewed following labs and imaging studies Basic Metabolic Panel:  Recent Labs Lab 06/18/17 2032 06/19/17 0206 06/19/17 1619 06/20/17 0436 06/21/17 0554 06/22/17 0538  NA 142 141  --  143 142 143  K 5.5* 6.0* 5.1 4.9 4.8 4.6  CL 115* 115*  --  116* 113* 113*  CO2 20* 21*  --  23 23 25   GLUCOSE 176* 213*  --  88 228* 99  BUN 41* 41*  --  34* 26* 25*  CREATININE 1.46* 1.29*  --  0.80 0.92 0.83  CALCIUM 8.8* 8.4*  --  8.2* 7.9* 8.2*  MG  --   --   --  2.1  --   --    Liver Function Tests:  Recent Labs Lab 06/18/17 1057  AST 20  ALT 21  ALKPHOS 75  BILITOT 0.2*  PROT 7.1  ALBUMIN 3.4*   No results for input(s): LIPASE, AMYLASE in the last 168 hours. No results for input(s): AMMONIA in the last 168 hours. Coagulation Profile: No results for input(s): INR, PROTIME in the last 168 hours. CBC:  Recent Labs Lab 06/18/17 1057 06/18/17 1344  WBC 6.5  --   NEUTROABS 2.8  --   HGB 9.5* 10.2*  HCT 30.3* 30.0*  MCV 84.4  --   PLT 267  --    Cardiac Enzymes:  Recent Labs Lab 06/18/17 2032 06/19/17 0206 06/19/17 0811  CKTOTAL 98  --   --   TROPONINI 0.30* 0.26* 0.25*   BNP: Invalid input(s): POCBNP CBG:  Recent Labs Lab 06/21/17 1105 06/21/17 1631 06/21/17 2144 06/22/17 0733 06/22/17 1152  GLUCAP 163* 200* 235* 95 198*   HbA1C: No results for input(s): HGBA1C in the last 72 hours. Urine analysis:    Component Value Date/Time   COLORURINE YELLOW 05/19/2016  1200   APPEARANCEUR CLEAR 05/19/2016 1200   LABSPEC 1.015 05/19/2016 1200   PHURINE 5.5 05/19/2016 1200   GLUCOSEU 250 (A) 05/19/2016 1200   HGBUR NEGATIVE 05/19/2016 1200   BILIRUBINUR NEGATIVE 05/19/2016 1200   KETONESUR NEGATIVE 05/19/2016 1200   PROTEINUR NEGATIVE 05/19/2016 1200   UROBILINOGEN 0.2 05/20/2013 0943   NITRITE NEGATIVE 05/19/2016 1200   LEUKOCYTESUR SMALL (A) 05/19/2016 1200   Sepsis Labs: @LABRCNTIP (procalcitonin:4,lacticidven:4) )No results found for this or any previous visit (from the past 240 hour(s)).   Scheduled Meds: . aspirin EC  81 mg Oral Daily  . atorvastatin  40 mg Oral QHS  . collagenase   Topical Daily  . enoxaparin (LOVENOX) injection  50 mg Subcutaneous Q24H  . gabapentin  400 mg Oral TID  . insulin aspart  0-15 Units Subcutaneous TID WC  . insulin aspart  0-5 Units Subcutaneous QHS  . insulin aspart  4 Units Subcutaneous TID WC  . insulin detemir  15 Units Subcutaneous BID  . living well with diabetes book- in spanish  Does not apply Once  . loratadine  10 mg Oral Daily  . rosuvastatin  20 mg Oral Daily   Continuous Infusions:  Procedures/Studies: Dg Chest 2 View  Result Date: 06/20/2017 CLINICAL DATA:  Acute onset of shortness of breath and hypoxia earlier today. Hyperkalemia. Current history of hypertension, diabetes, hyperlipidemia. EXAM: CHEST  2 VIEW COMPARISON:  06/18/2017, 03/06/2016 and 03/05/2016. CTA chest 03/05/2016. FINDINGS: Suboptimal inspiration. Cardiac silhouette mildly enlarged, unchanged. Hilar and mediastinal contours otherwise unremarkable. Moderate diffuse interstitial pulmonary edema, new since the examination 2 days ago. Small bilateral pleural effusions, unchanged. No confluent airspace consolidation. Degenerative changes involving the thoracic and upper lumbar spine. IMPRESSION: 1. Mild CHF and/or fluid overload, with acute moderate diffuse interstitial pulmonary edema which is new since 2 days ago. 2. Stable small  bilateral pleural effusions. Electronically Signed   By: Hulan Saas M.D.   On: 06/20/2017 13:51   Dg Chest 2 View  Result Date: 06/18/2017 CLINICAL DATA:  Shortness of breath EXAM: CHEST  2 VIEW COMPARISON:  03/06/2016 FINDINGS: Low lung volumes. Tiny pleural effusion. Borderline cardiomegaly. Mild perihilar interstitial opacity. No pneumothorax. IMPRESSION: 1. Tiny pleural effusions 2. Borderline cardiomegaly Electronically Signed   By: Jasmine Pang M.D.   On: 06/18/2017 23:26   Dg Os Calcis Right  Result Date: 06/04/2017 CLINICAL DATA:  Diabetic foot ulcer on heel. EXAM: RIGHT OS CALCIS - 2+ VIEW COMPARISON:  None. FINDINGS: There is no evidence of fracture or other focal bone lesions. Vascular calcifications are noted. No radiopaque foreign body is noted. No definite evidence of large ulceration is noted. IMPRESSION: No definite lytic destruction is seen in the visualized bone to suggest osteomyelitis. Electronically Signed   By: Lupita Raider, M.D.   On: 06/04/2017 09:48   US Renal  Result Date: 06/19/2017 CLINICAL DATA:  Acute kidney injury, history hypertension, type II diabetes mellitus EXAM: RENAL / URINARY TRACT ULTRASOUND COMPLETE COMPARISON:  None FINDINGS: Right Kidney: Length: 11.5 cm. Normal cortical thickness and echogenicity. No mass, hydronephrosis or shadowing calcification. Left Kidney: Length: 10.6 cm. Mildly lobulated renal contour, which can be a normal variant. Normal cortical thickness and echogenicity. No mass, hydronephrosis or shadowing calcification. Bladder: Only partially distended, grossly unremarkable. Ureteral jets were not visualized during the period of imaging. IMPRESSION: No renal sonographic abnormalities identified. Electronically Signed   By: Ulyses Southward M.D.   On: 06/19/2017 10:56   US Venous Img Lower Unilateral Right  Result Date: 06/22/2017 CLINICAL DATA:  Right lower extremity edema and chronic diabetic ulceration. Color changes. EXAM: Right LOWER  EXTREMITY VENOUS DOPPLER ULTRASOUND TECHNIQUE: Gray-scale sonography with graded compression, as well as color Doppler and duplex ultrasound were performed to evaluate the lower extremity deep venous systems from the level of the common femoral vein and including the common femoral, femoral, profunda femoral, popliteal and calf veins including the posterior tibial, peroneal and gastrocnemius veins when visible. The superficial great saphenous vein was also interrogated. Spectral Doppler was utilized to evaluate flow at rest and with distal augmentation maneuvers in the common femoral, femoral and popliteal veins. COMPARISON:  None. FINDINGS: Contralateral Common Femoral Vein: Respiratory phasicity is normal and symmetric with the symptomatic side. No evidence of thrombus. Normal compressibility. Common Femoral Vein: No evidence of thrombus. Normal compressibility, respiratory phasicity and response to augmentation. Saphenofemoral Junction: No evidence of thrombus. Normal compressibility and flow on color Doppler imaging. Profunda Femoral Vein: No evidence of thrombus. Normal compressibility and flow on color Doppler imaging. Femoral Vein: No evidence  of thrombus. Normal compressibility, respiratory phasicity and response to augmentation. Popliteal Vein: No evidence of thrombus. Normal compressibility, respiratory phasicity and response to augmentation. Calf Veins: No evidence of thrombus. Normal compressibility and flow on color Doppler imaging. Superficial Great Saphenous Vein: No evidence of thrombus. Normal compressibility and flow on color Doppler imaging. Venous Reflux:  None. Other Findings:  None. IMPRESSION: No evidence of DVT within the right lower extremity. Electronically Signed   By: Gaylyn Rong M.D.   On: 06/22/2017 11:15    Marilou Barnfield, DO  [facility] Pager [phone]  If 7PM-7AM, please contact night-coverage [email or web address] Password [email or web address] 06/22/2017, 2:57 PM   LOS: 0 days

## 2017-06-23 LAB — GLUCOSE, CAPILLARY
GLUCOSE-CAPILLARY: 108 mg/dL — AB (ref 65–99)
Glucose-Capillary: 240 mg/dL — ABNORMAL HIGH (ref 65–99)
Glucose-Capillary: 207 mg/dL — ABNORMAL HIGH (ref 65–99)
Glucose-Capillary: 109 mg/dL — ABNORMAL HIGH (ref 65–99)

## 2017-06-23 LAB — BASIC METABOLIC PANEL
Anion gap: 4 — ABNORMAL LOW (ref 5–15)
BUN: 28 mg/dL — AB (ref 6–20)
CALCIUM: 8 mg/dL — AB (ref 8.9–10.3)
CHLORIDE: 112 mmol/L — AB (ref 101–111)
CO2: 26 mmol/L (ref 22–32)
CREATININE: 0.81 mg/dL (ref 0.44–1.00)
GFR calc Af Amer: >60 mL/min (ref >60)
GFR calc non Af Amer: >60 mL/min (ref >60)
Glucose, Bld: 133 mg/dL — ABNORMAL HIGH (ref 65–99)
Potassium: 5.1 mmol/L (ref 3.5–5.1)
SODIUM: 142 mmol/L (ref 135–145)

## 2017-06-23 MED ORDER — METOPROLOL SUCCINATE ER 25 MG PO TB24
12.5000 mg | ORAL_TABLET | Freq: Every day | ORAL | Status: DC
  Administered 2017-06-23 – 2017-06-29 (×7): 12.5 mg via ORAL
  Filled 2017-06-23 (×7): qty 1

## 2017-06-23 NOTE — Plan of Care (Signed)
Problem: Food- and Nutrition-Related Knowledge Deficit (NB-1.1) Goal: Nutrition education Formal process to instruct or train a patient/client in a skill or to impart knowledge to help patients/clients voluntarily manage or modify food choices and eating behavior to maintain or improve health. Outcome: Adequate for Discharge  RD consulted for nutrition education regarding hyperkalemia and diabetes.   Lab Results  Component Value Date   HGBA1C 10.4 (H) 06/18/2017   Patient speaks fairly good [place]. Did not need to utilize interpreter phone.   Begun by first discussing hyperkalemia and potassium intake. She reports eating peaches, strawberries, bananas and grapes fairly habitually. RD stated that with the exception of her bananas, all of these fruits are relatively low in potassium compared to other fruits such as oranges or pears. Likely, her issue is more the quantity she is consuming. She is told this as asked to me cognizant of the portions she is consuming. Believe her fruit is not largest contributor in her diet. She eats beans frequently, as well as spaghetti with sauce. RD educated how tomatoes are high in potassium. She should try to be careful of her sauce intake.Some of the higher K foods she is consuming are vegetables that are desired to control her diabetes. RD did not bring this up to avoid her potentially avoiding these and worsening DM control, there by leading to faster progression of kidney disease and potassium issues. She is given a comprehensive list, in spanish, of the potassium content of foods.   RD turned to diabetic control. Educated on how her kidney health is related to how well her potassium levels are controlled. Discussed how poor diabetic control and lead to further kidney damage. Her A1c was over 10, showing poor glycemic control. It is in her best interest to better control her sugar levels.   Diet Recall Breakfast: Toast (White), Oatmeal (stone ground), Tomasa Blase,  eggs. No juice Lunch: Beans, tortillas, chicken, rice (white) Dinner: Similar to [employer]. Notes she eats lots of spaghetti (white). She says she places spaghetti on her burgers.  Beverages: Coffee with Splenda, water, 2 pepsi's/week Other: Reports minimal potato  intake, reports large intake of true vegetables: Broccoli, Cauliflower, carrots  Her diet seems to be largely based on cultural choices. She eats a large amount of tortillas and rice. She has further increased her carbohydrate intake by consuming pasta very frequently. She did not seem to know beforehand that all of these items are very dense in carbohydrates. RD stated he is much less concerned with her 2 weekly sodas than he is with her high grain intake.   Ideally, she needs to reduce the amount of rice, tortillas and pasta she consumed; this is her number one goal. She is encouraged to make up for his by increasing her fresh meat and vegetable intake. Her vegetable choices are already very good. She eats minimal starch. She would just need to increase her portions of these.   The easiest change she could make would be switching from white to whole grain rice, breads, noodles and tortillas. Explained how these can help curb her rise in blood sugars after meals.   She does not read labels. RD discussed how starting to do this could help her better control her intake. RD showed picture of a label and pointed out pertinent features. Most importantly, she should look at portion size and total carbohydrate values. She is given total carbohydrate goal of 45-75g/meal. She is likely eating well over 100g/meal.   RD provided "Diabetes Type 2 Nutrition  Therapy" and "Diabetes label reading tips" handouts, in spanish, from the Academy of Nutrition and Dietetics. However, she is at a disadvantage giver her trouble reading english and the fact her cultural foods, which she understandbly favors, are high in carbohydrates.   Summary of Recommendations 1.  Greatly reduce rice, pasta and tortilla intake 2. To make up for decrease in grains, increase vegetable/protein intake 3. Switch to whole grains from white 4. Read labels, aim for 45-75 g/carb per meal  Expect Fair-good compliance. She seems to be motivated. She asked for handouts and voiced questions, illustrating her desire to make beneficial changes  Body mass index is 41.98 kg/m. Pt meets criteria for morbidly obese based on current BMI.   No further nutrition interventions warranted at this time. If additional nutrition issues arise, please re-consult RD.  Christophe Louis RD, LDN, CNSC Clinical Nutrition Pager: [phone] 06/23/2017 10:57 AM

## 2017-06-23 NOTE — Progress Notes (Signed)
PROGRESS NOTE  Jill [name] ZOX:[id number] DOB: 1960-12-20 DOA: 06/18/2017 PCP: [facility]  Brief History: 56 y.o.femalewith medical history of essential hypertension, diabetes mellitus, hyperlipidemia, diabetic peripheral neuropathy presented after instructed by the [facility] health Department to present to the emergency department secondary to elevated potassium. The patient was seen at the health department on 06/17/2017 secondary to diabetic foot ulcer. Apparently, routine blood work was obtained. The patient was contacted on the morning of 06/18/2017 to report to the emergency department secondary to elevated potassium. The patient states that she has been eating a lot of fruits including grapes, peaches, strawberries, and tomatoes. However, the patient denies using any salt substitutes or taking any over-the-counter supplements. She denies any new medications other than the cephalexin that was started on 06/17/2017. The patient states that she has been taking ibuprofen 800 mg, 1 tablet daily for the past 3-4 weeks as well as when necessary for joint pains. During my evaluation, the patient also mentioned that she's been having some "chest burning" for the past several months when she has been taking walks with her family. She has also been expressing some dyspnea on exertion and orthopnea type symptoms. The patient was started on temporizing measures for her hyperkalemia, and she was given Kayexalate.  Assessment/Plan: Hyperkalemia  -Likely multifactorial including medications (lisinopril, ibuprofen), AKI, exogenous intake from food, and RTA type 4 -K is improved -consult dietician to help with education for avoiding K rich foods -am BMP  Cardiomyopathy--newly diagnosed/pulmonary hypertension -06/19/17 Echo--EF 45-at the percent, grade 2 DD, moderate to severe MR, PASP 62 -may be due to mitral regurg vs ischemic -appreciate cardiology  eval-->plan for TEE and heart catheterization -start metoprolol succinate  Hypoxia -stable/improved -personally reviewed repeat CXR--interstitial prominence -pt denies any SOB -oxygen saturation 95-96% on RA  CKD stage 3 -pt has progression of disease since 2017 -prior baseline 1.1-1.4 -Discontinue lisinopril, ibuprofen  -Check CPK 98 -Renal ultrasound--neg for hydronephrosis  Chest discomfort/Elevated troponin -troponins flat -?component of fluid overload vs cardiomyopathy -Echocardiogram --EF 45-50 percent, grade 2 DD, mild to severe MR, mild TR, PASP 62 -CXR-personally reviewed--pulm venous congestion -continue ASA  Diabetes mellitus type 2 with neuropathy -Continue Levemir at reduced dose -NovoLog sliding scale -Monitor CBGs and adjust insulin -add novolog 4 tiw -Hemoglobin A1c--10.4 -Continue gabapentin -holding janumet  Hyperlipidemia -Continue statin  Diabetic foot ulcers -Do not appear to be infected on clinical exam -wound care consult appreciate -d/c cephlexin  Right lower extremity edema -venous duplex--neg   Disposition Plan: Transfer to [place] for TEE and heart cath Family Communication: Son and spouse updatedat bedside at bedside 9/4  Consultants: cardiology  Code Status: FULL   DVT Prophylaxis: [place] Lovenox    Subjective: Patient denies fevers, chills, headache, chest pain, dyspnea, nausea, vomiting, diarrhea, abdominal pain, dysuria, hematuria, hematochezia, and melena.    Objective: Vitals:   06/22/17 1424 06/22/17 1427 06/22/17 2200 06/23/17 0500  BP:  131/71 135/75 (!) 106/48  Pulse:  91 84 89  Resp:  18 18 18   Temp:  98.5 F (36.9 C) 99 F (37.2 C) 98.7 F (37.1 C)  TempSrc:  Oral Oral Oral  SpO2: 91% 93% 95% 96%  Weight:    104.1 kg (229 lb 8 oz)  Height:        Intake/Output Summary (Last 24 hours) at 06/23/17 1126 Last data filed at 06/22/17 1227  Gross per 24 hour  Intake  360 ml   Output                0 ml  Net              360 ml   Weight change: 3.1 kg (6 lb 13.4 oz) Exam:   General:  Pt is alert, follows commands appropriately, not in acute distress  HEENT: No icterus, No thrush, No neck mass, [place]/AT  Cardiovascular: RRR, S1/S2, no rubs, no gallops  Respiratory: bibasilar crackles, no wheeze  Abdomen: Soft/+BS, non tender, non distended, no guarding  Extremities: No edema, No lymphangitis, No petechiae, No rashes, no synovitis   Data Reviewed: I have personally reviewed following labs and imaging studies Basic Metabolic Panel:  Recent Labs Lab 06/19/17 0206 06/19/17 1619 06/20/17 0436 06/21/17 0554 06/22/17 0538 06/23/17 0421  NA 141  --  143 142 143 142  K 6.0* 5.1 4.9 4.8 4.6 5.1  CL 115*  --  116* 113* 113* 112*  CO2 21*  --  23 23 25 26   GLUCOSE 213*  --  88 228* 99 133*  BUN 41*  --  34* 26* 25* 28*  CREATININE 1.29*  --  0.80 0.92 0.83 0.81  CALCIUM 8.4*  --  8.2* 7.9* 8.2* 8.0*  MG  --   --  2.1  --   --   --    Liver Function Tests:  Recent Labs Lab 06/18/17 1057  AST 20  ALT 21  ALKPHOS 75  BILITOT 0.2*  PROT 7.1  ALBUMIN 3.4*   No results for input(s): LIPASE, AMYLASE in the last 168 hours. No results for input(s): AMMONIA in the last 168 hours. Coagulation Profile: No results for input(s): INR, PROTIME in the last 168 hours. CBC:  Recent Labs Lab 06/18/17 1057 06/18/17 1344  WBC 6.5  --   NEUTROABS 2.8  --   HGB 9.5* 10.2*  HCT 30.3* 30.0*  MCV 84.4  --   PLT 267  --    Cardiac Enzymes:  Recent Labs Lab 06/18/17 2032 06/19/17 0206 06/19/17 0811  CKTOTAL 98  --   --   TROPONINI 0.30* 0.26* 0.25*   BNP: Invalid input(s): POCBNP CBG:  Recent Labs Lab 06/22/17 0733 06/22/17 1152 06/22/17 1613 06/22/17 2210 06/23/17 0734  GLUCAP 95 198* 228* 188* 108*   HbA1C: No results for input(s): HGBA1C in the last 72 hours. Urine analysis:    Component Value Date/Time   COLORURINE YELLOW  05/19/2016 1200   APPEARANCEUR CLEAR 05/19/2016 1200   LABSPEC 1.015 05/19/2016 1200   PHURINE 5.5 05/19/2016 1200   GLUCOSEU 250 (A) 05/19/2016 1200   HGBUR NEGATIVE 05/19/2016 1200   BILIRUBINUR NEGATIVE 05/19/2016 1200   KETONESUR NEGATIVE 05/19/2016 1200   PROTEINUR NEGATIVE 05/19/2016 1200   UROBILINOGEN 0.2 05/20/2013 0943   NITRITE NEGATIVE 05/19/2016 1200   LEUKOCYTESUR SMALL (A) 05/19/2016 1200   Sepsis Labs: @LABRCNTIP (procalcitonin:4,lacticidven:4) )No results found for this or any previous visit (from the past 240 hour(s)).   Scheduled Meds: . aspirin EC  81 mg Oral Daily  . collagenase   Topical Daily  . enoxaparin (LOVENOX) injection  50 mg Subcutaneous Q24H  . gabapentin  400 mg Oral TID  . insulin aspart  0-15 Units Subcutaneous TID WC  . insulin aspart  0-5 Units Subcutaneous QHS  . insulin aspart  4 Units Subcutaneous TID WC  . insulin detemir  15 Units Subcutaneous BID  . living well with diabetes book- in spanish  Does not apply Once  . loratadine  10 mg Oral Daily  . metoprolol succinate  12.5 mg Oral Daily  . rosuvastatin  20 mg Oral Daily   Continuous Infusions:  Procedures/Studies: Dg Chest 2 View  Result Date: 06/20/2017 CLINICAL DATA:  Acute onset of shortness of breath and hypoxia earlier today. Hyperkalemia. Current history of hypertension, diabetes, hyperlipidemia. EXAM: CHEST  2 VIEW COMPARISON:  06/18/2017, 03/06/2016 and 03/05/2016. CTA chest 03/05/2016. FINDINGS: Suboptimal inspiration. Cardiac silhouette mildly enlarged, unchanged. Hilar and mediastinal contours otherwise unremarkable. Moderate diffuse interstitial pulmonary edema, new since the examination 2 days ago. Small bilateral pleural effusions, unchanged. No confluent airspace consolidation. Degenerative changes involving the thoracic and upper lumbar spine. IMPRESSION: 1. Mild CHF and/or fluid overload, with acute moderate diffuse interstitial pulmonary edema which is new since 2 days  ago. 2. Stable small bilateral pleural effusions. Electronically Signed   By: Hulan Saas M.D.   On: 06/20/2017 13:51   Dg Chest 2 View  Result Date: 06/18/2017 CLINICAL DATA:  Shortness of breath EXAM: CHEST  2 VIEW COMPARISON:  03/06/2016 FINDINGS: Low lung volumes. Tiny pleural effusion. Borderline cardiomegaly. Mild perihilar interstitial opacity. No pneumothorax. IMPRESSION: 1. Tiny pleural effusions 2. Borderline cardiomegaly Electronically Signed   By: Jasmine Pang M.D.   On: 06/18/2017 23:26   Dg Os Calcis Right  Result Date: 06/04/2017 CLINICAL DATA:  Diabetic foot ulcer on heel. EXAM: RIGHT OS CALCIS - 2+ VIEW COMPARISON:  None. FINDINGS: There is no evidence of fracture or other focal bone lesions. Vascular calcifications are noted. No radiopaque foreign body is noted. No definite evidence of large ulceration is noted. IMPRESSION: No definite lytic destruction is seen in the visualized bone to suggest osteomyelitis. Electronically Signed   By: Lupita Raider, M.D.   On: 06/04/2017 09:48   US Renal  Result Date: 06/19/2017 CLINICAL DATA:  Acute kidney injury, history hypertension, type II diabetes mellitus EXAM: RENAL / URINARY TRACT ULTRASOUND COMPLETE COMPARISON:  None FINDINGS: Right Kidney: Length: 11.5 cm. Normal cortical thickness and echogenicity. No mass, hydronephrosis or shadowing calcification. Left Kidney: Length: 10.6 cm. Mildly lobulated renal contour, which can be a normal variant. Normal cortical thickness and echogenicity. No mass, hydronephrosis or shadowing calcification. Bladder: Only partially distended, grossly unremarkable. Ureteral jets were not visualized during the period of imaging. IMPRESSION: No renal sonographic abnormalities identified. Electronically Signed   By: Ulyses Southward M.D.   On: 06/19/2017 10:56   US Venous Img Lower Unilateral Right  Result Date: 06/22/2017 CLINICAL DATA:  Right lower extremity edema and chronic diabetic ulceration. Color  changes. EXAM: Right LOWER EXTREMITY VENOUS DOPPLER ULTRASOUND TECHNIQUE: Gray-scale sonography with graded compression, as well as color Doppler and duplex ultrasound were performed to evaluate the lower extremity deep venous systems from the level of the common femoral vein and including the common femoral, femoral, profunda femoral, popliteal and calf veins including the posterior tibial, peroneal and gastrocnemius veins when visible. The superficial great saphenous vein was also interrogated. Spectral Doppler was utilized to evaluate flow at rest and with distal augmentation maneuvers in the common femoral, femoral and popliteal veins. COMPARISON:  None. FINDINGS: Contralateral Common Femoral Vein: Respiratory phasicity is normal and symmetric with the symptomatic side. No evidence of thrombus. Normal compressibility. Common Femoral Vein: No evidence of thrombus. Normal compressibility, respiratory phasicity and response to augmentation. Saphenofemoral Junction: No evidence of thrombus. Normal compressibility and flow on color Doppler imaging. Profunda Femoral Vein: No evidence of thrombus. Normal compressibility and  flow on color Doppler imaging. Femoral Vein: No evidence of thrombus. Normal compressibility, respiratory phasicity and response to augmentation. Popliteal Vein: No evidence of thrombus. Normal compressibility, respiratory phasicity and response to augmentation. Calf Veins: No evidence of thrombus. Normal compressibility and flow on color Doppler imaging. Superficial Great Saphenous Vein: No evidence of thrombus. Normal compressibility and flow on color Doppler imaging. Venous Reflux:  None. Other Findings:  None. IMPRESSION: No evidence of DVT within the right lower extremity. Electronically Signed   By: Gaylyn Rong M.D.   On: 06/22/2017 11:15    Skylynn Burkley, DO  [facility] Pager [phone]  If 7PM-7AM, please contact night-coverage [email or web address] Password [email or web address] 06/23/2017,  11:26 AM   LOS: 0 days

## 2017-06-23 NOTE — Progress Notes (Addendum)
Progress Note  Patient Name: Jill [name] Date of Encounter: 06/23/2017  Primary Cardiologist: Diona Browner   Subjective   No complaints of chest pain or dyspnea. Up waling in halls without symptoms.   Inpatient Medications    Scheduled Meds: . aspirin EC  81 mg Oral Daily  . atorvastatin  40 mg Oral QHS  . collagenase   Topical Daily  . enoxaparin (LOVENOX) injection  50 mg Subcutaneous Q24H  . gabapentin  400 mg Oral TID  . insulin aspart  0-15 Units Subcutaneous TID WC  . insulin aspart  0-5 Units Subcutaneous QHS  . insulin aspart  4 Units Subcutaneous TID WC  . insulin detemir  15 Units Subcutaneous BID  . living well with diabetes book- in spanish   Does not apply Once  . loratadine  10 mg Oral Daily  . rosuvastatin  20 mg Oral Daily   Continuous Infusions:  PRN Meds: acetaminophen **OR** acetaminophen, ondansetron **OR** ondansetron (ZOFRAN) IV   Vital Signs    Vitals:   06/22/17 1424 06/22/17 1427 06/22/17 2200 06/23/17 0500  BP:  131/71 135/75 (!) 106/48  Pulse:  91 84 89  Resp:  18 18 18   Temp:  98.5 F (36.9 C) 99 F (37.2 C) 98.7 F (37.1 C)  TempSrc:  Oral Oral Oral  SpO2: 91% 93% 95% 96%  Weight:    229 lb 8 oz (104.1 kg)  Height:        Intake/Output Summary (Last 24 hours) at 06/23/17 0934 Last data filed at 06/22/17 1227  Gross per 24 hour  Intake              360 ml  Output                0 ml  Net              360 ml   Filed Weights   06/21/17 0703 06/22/17 0609 06/23/17 0500  Weight: 222 lb 10.6 oz (101 kg) 226 lb 9.6 oz (102.8 kg) 229 lb 8 oz (104.1 kg)    Telemetry     Personally Reviewed  Physical Exam   GEN: No acute distress.   Neck: No JVD Cardiac: RRR, no murmurs, rubs, or gallops.  Respiratory: Clear to auscultation bilaterally. GI: Soft, nontender, non-distended  MS: No edema; No deformity. Neuro:  Nonfocal  Psych: Normal affect   Labs    Chemistry Recent Labs Lab 06/18/17 1057  06/21/17 0554 06/22/17 0538  06/23/17 0421  NA 137  < > 142 143 142  K >7.5*  < > 4.8 4.6 5.1  CL 115*  < > 113* 113* 112*  CO2 18*  < > 23 25 26   GLUCOSE 195*  < > 228* 99 133*  BUN 46*  < > 26* 25* 28*  CREATININE 1.47*  < > 0.92 0.83 0.81  CALCIUM 8.6*  < > 7.9* 8.2* 8.0*  PROT 7.1  --   --   --   --   ALBUMIN 3.4*  --   --   --   --   AST 20  --   --   --   --   ALT 21  --   --   --   --   ALKPHOS 75  --   --   --   --   BILITOT 0.2*  --   --   --   --   GFRNONAA 39*  < > >60 >60 >  60  GFRAA 45*  < > >60 >60 >60  ANIONGAP 4*  < > 6 5 4*  < > = values in this interval not displayed.   Hematology Recent Labs Lab 06/18/17 1057 06/18/17 1344  WBC 6.5  --   RBC 3.59*  --   HGB 9.5* 10.2*  HCT 30.3* 30.0*  MCV 84.4  --   MCH 26.5  --   MCHC 31.4  --   RDW 15.0  --   PLT 267  --     Cardiac Enzymes Recent Labs Lab 06/18/17 2032 06/19/17 0206 06/19/17 0811  TROPONINI 0.30* 0.26* 0.25*      Radiology    US Venous Img Lower Unilateral Right  Result Date: 06/22/2017 CLINICAL DATA:  Right lower extremity edema and chronic diabetic ulceration. Color changes. EXAM: Right LOWER EXTREMITY VENOUS DOPPLER ULTRASOUND TECHNIQUE: Gray-scale sonography with graded compression, as well as color Doppler and duplex ultrasound were performed to evaluate the lower extremity deep venous systems from the level of the common femoral vein and including the common femoral, femoral, profunda femoral, popliteal and calf veins including the posterior tibial, peroneal and gastrocnemius veins when visible. The superficial great saphenous vein was also interrogated. Spectral Doppler was utilized to evaluate flow at rest and with distal augmentation maneuvers in the common femoral, femoral and popliteal veins. COMPARISON:  None. FINDINGS: Contralateral Common Femoral Vein: Respiratory phasicity is normal and symmetric with the symptomatic side. No evidence of thrombus. Normal compressibility. Common Femoral Vein: No evidence of  thrombus. Normal compressibility, respiratory phasicity and response to augmentation. Saphenofemoral Junction: No evidence of thrombus. Normal compressibility and flow on color Doppler imaging. Profunda Femoral Vein: No evidence of thrombus. Normal compressibility and flow on color Doppler imaging. Femoral Vein: No evidence of thrombus. Normal compressibility, respiratory phasicity and response to augmentation. Popliteal Vein: No evidence of thrombus. Normal compressibility, respiratory phasicity and response to augmentation. Calf Veins: No evidence of thrombus. Normal compressibility and flow on color Doppler imaging. Superficial Great Saphenous Vein: No evidence of thrombus. Normal compressibility and flow on color Doppler imaging. Venous Reflux:  None. Other Findings:  None. IMPRESSION: No evidence of DVT within the right lower extremity. Electronically Signed   By: Gaylyn Rong M.D.   On: 06/22/2017 11:15    Cardiac Studies   Echocardiogram 06/19/2017 Left ventricle: The cavity size was normal. Wall thickness was at   the upper limits of normal. Systolic function was mildly reduced.   The estimated ejection fraction was in the range of 45% to 50%.   Diffuse hypokinesis. Features are consistent with a pseudonormal   left ventricular filling pattern, with concomitant abnormal   relaxation and increased filling pressure (grade 2 diastolic   dysfunction). - Mitral valve: There was moderate to severe regurgitation. - Left atrium: The atrium was moderately dilated. - Right atrium: Central venous pressure (est): 3 mm Hg. - Atrial septum: No defect or patent foramen ovale was identified. - Tricuspid valve: There was mild regurgitation. - Pulmonary arteries: Systolic pressure was severely increased. PA   peak pressure: 62 mm Hg (S). - Pericardium, extracardiac: There was no pericardial effusion.   Patient Profile     56 y.o. female  with a history of Type 2 diabetes mellitus with peripheral  neuropathy, hypertension, and hyperlipidemia who is being seen for the evaluation of exertional shortness of breath and chest discomfort. Echo 06/19/2017,  demonstrated LVEF is mildly reduced at 45-50% with diffuse hypokinesis and there is moderate diastolic dysfunction  moderate to severe eccentric mitral regurgitation, likely secondary pulmonary hypertension. Mechanism of her mitral regurgitation was not clear, no obvious leaflet incompetence or substantial annular dilatation was found. Recommended for TEE sometime this week if stable. .   Assessment & Plan    1. Chest pain: Troponin is minimally elevated but flat with abnormal EKG demonstrating non-specific ST-T wave abnormalities. Multiple CVRF's of HTN, Hyperlipidemia, Type II diabetes. Consideration for stress test will be discussed. Electrolytes are improved.   2. Moderate to Severe Mitral Valve Disease: Noted on echocardiogram this admission.with decreased EF of 45%-50%. Dr. Diona Browner recommended TEE for further evaluation of MR.   3. Type II Diabetes: Being followed by PCP  4. CKD Stage III: Creatinine this am 0.81.   5. Hypertension: Blood pressure is soft. Not on antihypertensive medications.     Signed, Bettey Mare. Liborio Nixon, ANP, AACC   06/23/2017, 9:34 AM    Patient seen and discussed with DNP Lyman Bishop, I agree with her documentation above. Patient addmited with chest pain and SOB and hyperkalemia. Found to have elevated troponin to 0.3. EKG with inferior and lateral precordial ST depressions. CAD risk factors include DM2, HTN, HL. Initially manged for K of 7.5, plans were for further cardiac testing once stabilized. Given her multiple CAD risk factors, troponin elevation, and EKG changes, and mild LV dysfunction I believe her pretest probability for CAD is too high for noninvasive testing. In setting of moderate to severe MR her true LVEF is actually lower than reported. Would plan for LHC as well as RHC given her pulmonary HTN. Her  MR will require further evaluation as well, will need TEE. Would plan for TEE first prior to cath, if true severe MR then given her low LVEF may need to be considered for valve intervention.   Current medical therapy with ASA, atorva 40. In absence of ongoing chest pain and downtrending troponin has not been on anticoagulation. Start low dose Toprol XL 12.55m daily, if tolerates consider low dose ACE-I. May need further diuresis, hold at this time until after cath.   CXR tiny pleural effusions 05/2017 echo: LVEF 45-50%, grade II diastolic dysfunction, mod to severe MR, mod LAE, PASP 62  Dina Rich MD

## 2017-06-24 DIAGNOSIS — Z6841 Body Mass Index (BMI) 40.0 and over, adult: Secondary | ICD-10-CM | POA: Diagnosis not present

## 2017-06-24 DIAGNOSIS — E785 Hyperlipidemia, unspecified: Secondary | ICD-10-CM | POA: Diagnosis present

## 2017-06-24 DIAGNOSIS — L89610 Pressure ulcer of right heel, unstageable: Secondary | ICD-10-CM | POA: Diagnosis present

## 2017-06-24 DIAGNOSIS — I2729 Other secondary pulmonary hypertension: Secondary | ICD-10-CM | POA: Diagnosis present

## 2017-06-24 DIAGNOSIS — I130 Hypertensive heart and chronic kidney disease with heart failure and stage 1 through stage 4 chronic kidney disease, or unspecified chronic kidney disease: Secondary | ICD-10-CM | POA: Diagnosis present

## 2017-06-24 DIAGNOSIS — I081 Rheumatic disorders of both mitral and tricuspid valves: Secondary | ICD-10-CM | POA: Diagnosis present

## 2017-06-24 DIAGNOSIS — D638 Anemia in other chronic diseases classified elsewhere: Secondary | ICD-10-CM | POA: Diagnosis present

## 2017-06-24 DIAGNOSIS — Z7982 Long term (current) use of aspirin: Secondary | ICD-10-CM | POA: Diagnosis not present

## 2017-06-24 DIAGNOSIS — W19XXXA Unspecified fall, initial encounter: Secondary | ICD-10-CM | POA: Diagnosis not present

## 2017-06-24 DIAGNOSIS — E1122 Type 2 diabetes mellitus with diabetic chronic kidney disease: Secondary | ICD-10-CM | POA: Diagnosis present

## 2017-06-24 DIAGNOSIS — L97509 Non-pressure chronic ulcer of other part of unspecified foot with unspecified severity: Secondary | ICD-10-CM | POA: Diagnosis present

## 2017-06-24 DIAGNOSIS — N179 Acute kidney failure, unspecified: Secondary | ICD-10-CM | POA: Diagnosis present

## 2017-06-24 DIAGNOSIS — E1165 Type 2 diabetes mellitus with hyperglycemia: Secondary | ICD-10-CM | POA: Diagnosis present

## 2017-06-24 DIAGNOSIS — R0789 Other chest pain: Secondary | ICD-10-CM

## 2017-06-24 DIAGNOSIS — I340 Nonrheumatic mitral (valve) insufficiency: Secondary | ICD-10-CM

## 2017-06-24 DIAGNOSIS — Z794 Long term (current) use of insulin: Secondary | ICD-10-CM | POA: Diagnosis not present

## 2017-06-24 DIAGNOSIS — Z8249 Family history of ischemic heart disease and other diseases of the circulatory system: Secondary | ICD-10-CM | POA: Diagnosis not present

## 2017-06-24 DIAGNOSIS — N183 Chronic kidney disease, stage 3 (moderate): Secondary | ICD-10-CM | POA: Diagnosis present

## 2017-06-24 DIAGNOSIS — E1142 Type 2 diabetes mellitus with diabetic polyneuropathy: Secondary | ICD-10-CM | POA: Diagnosis present

## 2017-06-24 DIAGNOSIS — R748 Abnormal levels of other serum enzymes: Secondary | ICD-10-CM | POA: Diagnosis present

## 2017-06-24 DIAGNOSIS — I5043 Acute on chronic combined systolic (congestive) and diastolic (congestive) heart failure: Secondary | ICD-10-CM | POA: Diagnosis present

## 2017-06-24 DIAGNOSIS — Z833 Family history of diabetes mellitus: Secondary | ICD-10-CM | POA: Diagnosis not present

## 2017-06-24 DIAGNOSIS — E11621 Type 2 diabetes mellitus with foot ulcer: Secondary | ICD-10-CM | POA: Diagnosis present

## 2017-06-24 DIAGNOSIS — I420 Dilated cardiomyopathy: Secondary | ICD-10-CM

## 2017-06-24 DIAGNOSIS — E875 Hyperkalemia: Secondary | ICD-10-CM | POA: Diagnosis present

## 2017-06-24 DIAGNOSIS — I429 Cardiomyopathy, unspecified: Secondary | ICD-10-CM | POA: Diagnosis present

## 2017-06-24 DIAGNOSIS — I25119 Atherosclerotic heart disease of native coronary artery with unspecified angina pectoris: Secondary | ICD-10-CM | POA: Diagnosis present

## 2017-06-24 LAB — GLUCOSE, CAPILLARY
GLUCOSE-CAPILLARY: 101 mg/dL — AB (ref 65–99)
GLUCOSE-CAPILLARY: 193 mg/dL — AB (ref 65–99)
GLUCOSE-CAPILLARY: 227 mg/dL — AB (ref 65–99)
Glucose-Capillary: 254 mg/dL — ABNORMAL HIGH (ref 65–99)

## 2017-06-24 NOTE — Progress Notes (Signed)
Pt is independent  and was set up for bath

## 2017-06-24 NOTE — Progress Notes (Signed)
PROGRESS NOTE  Jill [name] ZOX:[id number] DOB: 22-May-1961 DOA: 06/18/2017 PCP: [facility]  HPI/Recap of past 24 hours:  Sitting up in chair, denies chest pain , no fever, no cough  husband and son at bedside  Assessment/Plan: Active Problems:   Essential hypertension   Hyperkalemia   Type 2 diabetes mellitus, uncontrolled, with neuropathy (HCC)   Hyperlipidemia   Chest pain   Elevated troponin   CKD (chronic kidney disease) stage 3, GFR 30-59 ml/min   Pressure injury of skin   Cardiomyopathy (HCC)   Hyperkalemia  -she [facility] for diabetic foot ulcers, routine work up showed hyperkalemia, she is referred to [facility].  -Likely multifactorial including medications (lisinopril, ibuprofen), AKI, exogenous intake from food,  RTA type 4 and relative insulin deficiency -she received kayexalate/calciun gluconate/bicarb/albuterol ---lisinopril /ibuprofen discontinued  - K is improved -consult dietician to help with education for avoiding K rich foods -am BMP  Chest pain, Cardiomyopathy--newly diagnosed/pulmonary hypertension --06/19/17 Echo--EF 45-at the percent, grade 2 DD, moderate to severe MR, PASP 62 -may be due to mitral regurg vs ischemic -appreciate cardiology eval-->plan for TEE on 9/6, and cardiac cath Friday  -she is currently on asa/statin/betablocker   acute on chronic chf vs fluids overload?  -there is documented brief hypoxia initially on presentation, currently on room air, no hypoxia - cxr on 9/1 concerns for mild chf "with acute moderate diffuse interstitial pulmonary edema which is new since 2 days ago" -she does has bilateral lower extremity pitting edema (R>L) on exam today, she reports this is new started a few days ago -Venous doppler negative for DVT on right lower extremity -prn lasix, close monitor volume status, cardiology following    AKI on CKD stage II --Discontinue lisinopril,  ibuprofen  -CPK 98 -Renal ultrasound--neg for hydronephrosis -Cr peaked at 1.47, today 0.8, ua was not done on admission  Hyperlipidemia -no lipid panel on file, will check fasting lipid in am -Continue statin  Insulin dependent iabetes mellitus type 2 with neuropathy -she has been on insulin , GLP1 agonists injection and  Oral meds with dpp4inhibitor/metformin -Hemoglobin A1c--10.4 -Continue gabapentin -she has been on insulin here, diabetes education -expect resume home meds at discharge.   Diabetic foot ulcers -Do not appear to be infected on clinical exam -wound care consult appreciate -d/c cephlexin    Code Status: full  Family Communication: patient and family at bedside  Disposition Plan: not ready for discharge, may need home  Health at discharge   Consultants:  cardiology  Procedures:  planned TEE and cardiac cath  Antibiotics:  none   Objective: BP 122/62 (BP Location: Left Arm)   Pulse 77   Temp 98 F (36.7 C) (Oral)   Resp 18   Ht 5\' 3"  (1.6 m)   Wt 102.5 kg (226 lb) Comment: scale A  SpO2 94%   BMI 40.03 kg/m   Intake/Output Summary (Last 24 hours) at 06/24/17 1012 Last data filed at 06/24/17 0547  Gross per 24 hour  Intake              240 ml  Output                0 ml  Net              240 ml   Filed Weights   06/22/17 0609 06/23/17 0500 06/24/17 0334  Weight: 102.8 kg (226 lb 9.6 oz) 104.1 kg (229 lb 8 oz) 102.5  kg (226 lb)    Exam: Patient is examined daily including today on 06/24/2017, exams remain the same as of yesterday except that has changed    General:  NAD  Cardiovascular: RRR  Respiratory: diminished at basis with mild bibasilar crackles, no wheezing, no rhonchi  Abdomen: Soft/ND/NT, positive BS  Musculoskeletal: bilateral legt pitting Edema, right > left, chronic right foot ulcer ( she reports stepped on a stone)  Neuro: alert, oriented   Data Reviewed: Basic Metabolic Panel:  Recent Labs Lab  06/19/17 0206 06/19/17 1619 06/20/17 0436 06/21/17 0554 06/22/17 0538 06/23/17 0421  NA 141  --  143 142 143 142  K 6.0* 5.1 4.9 4.8 4.6 5.1  CL 115*  --  116* 113* 113* 112*  CO2 21*  --  23 23 25 26   GLUCOSE 213*  --  88 228* 99 133*  BUN 41*  --  34* 26* 25* 28*  CREATININE 1.29*  --  0.80 0.92 0.83 0.81  CALCIUM 8.4*  --  8.2* 7.9* 8.2* 8.0*  MG  --   --  2.1  --   --   --    Liver Function Tests:  Recent Labs Lab 06/18/17 1057  AST 20  ALT 21  ALKPHOS 75  BILITOT 0.2*  PROT 7.1  ALBUMIN 3.4*   No results for input(s): LIPASE, AMYLASE in the last 168 hours. No results for input(s): AMMONIA in the last 168 hours. CBC:  Recent Labs Lab 06/18/17 1057 06/18/17 1344  WBC 6.5  --   NEUTROABS 2.8  --   HGB 9.5* 10.2*  HCT 30.3* 30.0*  MCV 84.4  --   PLT 267  --    Cardiac Enzymes:    Recent Labs Lab 06/18/17 2032 06/19/17 0206 06/19/17 0811  CKTOTAL 98  --   --   TROPONINI 0.30* 0.26* 0.25*   BNP (last 3 results) No results for input(s): BNP in the last 8760 hours.  ProBNP (last 3 results) No results for input(s): PROBNP in the last 8760 hours.  CBG:  Recent Labs Lab 06/23/17 0734 06/23/17 1122 06/23/17 1703 06/23/17 2214 06/24/17 0749  GLUCAP 108* 240* 207* 109* 101*    No results found for this or any previous visit (from the past 240 hour(s)).   Studies: No results found.  Scheduled Meds: . aspirin EC  81 mg Oral Daily  . collagenase   Topical Daily  . enoxaparin (LOVENOX) injection  50 mg Subcutaneous Q24H  . gabapentin  400 mg Oral TID  . insulin aspart  0-15 Units Subcutaneous TID WC  . insulin aspart  0-5 Units Subcutaneous QHS  . insulin aspart  4 Units Subcutaneous TID WC  . insulin detemir  15 Units Subcutaneous BID  . living well with diabetes book- in spanish   Does not apply Once  . loratadine  10 mg Oral Daily  . metoprolol succinate  12.5 mg Oral Daily  . rosuvastatin  20 mg Oral Daily    Continuous  Infusions:   Time spent: I have personally reviewed and interpreted on  06/24/2017 daily labs, tele strips, imagings as discussed above under date review session and assessment and plans.  I reviewed all nursing notes, pharmacy notes, consultant notes,  vitals, pertinent old records  I have discussed plan of care as described above with RN , patient and family on 06/24/2017   Manly Nestle MD, PhD  [facility] Pager [phone]. If 7PM-7AM, please contact night-coverage at [email or web address], password [facility] 06/24/2017,  10:12 AM  LOS: 0 days

## 2017-06-24 NOTE — Progress Notes (Signed)
Order received from Dr. Toniann Fail to hold Levamir insulin due to patient being NPO.

## 2017-06-24 NOTE — Consult Note (Addendum)
WOC consult requested for right foot wounds.  This was already performed on 8/31 when patient was at [facility] hospital. Patient was transferred to [facility] on 9/4, refer to previous progress note for assessment and measurements, and topical treatment orders have been provided for staff nurses to perform.  Please re-consult if further assistance is needed.  Thank-you,  Cammie Mcgee MSN, RN, CWOCN, [place], CNS [phone]

## 2017-06-24 NOTE — Progress Notes (Addendum)
Patient arrived to unit, oriented to unit by Marlinda Mike, RN and Merlyn Albert, Charity fundraiser. Patient ambulating in room, informed about NPO status and possible procedure. Patient agrees with plan of care. Denies any pain or concerns right now. MD paged about patients arrival from [facility].

## 2017-06-24 NOTE — Progress Notes (Signed)
    [facility] has been requested to perform a transesophageal echocardiogram on Jill [name] for mod-severe MR.  After careful review of history and examination, the risks and benefits of transesophageal echocardiogram have been explained including risks of esophageal damage, perforation (1:10,000 risk), bleeding, pharyngeal hematoma as well as other potential complications associated with conscious sedation including aspiration, arrhythmia, respiratory failure and death. Alternatives to treatment were discussed, questions were answered. Patient is willing to proceed.   The procedure is scheduled for tomorrow 06/25/17 at 3:00 pm to be done by Dr. Royann Shivers.   Berton Bon, NP  06/24/2017 12:31 PM

## 2017-06-24 NOTE — Progress Notes (Signed)
Call received from [facility], letting me know that bed has been ready for the patient, and she will be going to [address], room #4. Report given to her.

## 2017-06-24 NOTE — Consult Note (Signed)
WOC Nurse wound consult note Reason for Consult: right heel wound Patient's nurse requested me to follow up with patient, unclear on wound care orders. It was not noted until after my assessment that my partner had seen her on last Friday Wound type: right heel; unstageable pressure injury 1.5cm x 2cm x 0.3cm  Pressure Injury POA: Yes Measurement:see above  Wound bed: 90% pink/10% yellow in the base Drainage (amount, consistency, odor) minimal, serous, no odor Periwound: intact  Dressing procedure/placement/frequency: Continue enzymatic debridement ointment, cover with dry dressing. sEcure with ACE wrap. Change daily.  Float heels when in bed.  Discussed POC with patient and bedside nurse.  Re consult if needed, will not follow at this time. Thanks  Christalyn Goertz [employer], RN,CWOCN, CNS, CWON-AP [phone])

## 2017-06-24 NOTE — Progress Notes (Signed)
Patient picked up by [facility] and transported to [facility].

## 2017-06-24 NOTE — Progress Notes (Signed)
Chaplain introducer herself to patient and family.  Patient understands some English but is mostly  [place] speaking.  Patient's partner said he understood [place] and speaks [place].  Patient advised to read Advanced Directive, which I provided a Spanish and they will let nurse know if they desire to complete while here in the hospital.     06/24/17 1419  Clinical Encounter Type  Visited With Patient;Family  Visit Type Initial;Psychological support;Spiritual support

## 2017-06-24 NOTE — Progress Notes (Signed)
Progress Note  Patient Name: Jill [name] Date of Encounter: 06/24/2017  Primary Cardiologist:  Dr Diona Browner  Subjective   The patient is still having some mild upper chest burning that goes to her throat and mild DOE with walking in the hall. No discomfort or shortness of breath at rest. She also has orthopnea and has to have her bed at about 20 degrees elevation.  Inpatient Medications    Scheduled Meds: . aspirin EC  81 mg Oral Daily  . collagenase   Topical Daily  . enoxaparin (LOVENOX) injection  50 mg Subcutaneous Q24H  . gabapentin  400 mg Oral TID  . insulin aspart  0-15 Units Subcutaneous TID WC  . insulin aspart  0-5 Units Subcutaneous QHS  . insulin aspart  4 Units Subcutaneous TID WC  . insulin detemir  15 Units Subcutaneous BID  . living well with diabetes book- in spanish   Does not apply Once  . loratadine  10 mg Oral Daily  . metoprolol succinate  12.5 mg Oral Daily  . rosuvastatin  20 mg Oral Daily   Continuous Infusions:  PRN Meds: acetaminophen **OR** acetaminophen, ondansetron **OR** ondansetron (ZOFRAN) IV   Vital Signs    Vitals:   06/23/17 2200 06/23/17 2349 06/24/17 0202 06/24/17 0334  BP: 135/69 (!) 148/73 123/64 122/62  Pulse: 80 77 79 77  Resp: 20 18 20 18   Temp: 98.1 F (36.7 C) 98.2 F (36.8 C) 98.5 F (36.9 C) 98 F (36.7 C)  TempSrc: Oral Oral Oral Oral  SpO2: 95% 97% 94% 94%  Weight:    226 lb (102.5 kg)  Height:    5\' 3"  (1.6 m)    Intake/Output Summary (Last 24 hours) at 06/24/17 0731 Last data filed at 06/24/17 0547  Gross per 24 hour  Intake              480 ml  Output                0 ml  Net              480 ml   Filed Weights   06/22/17 0609 06/23/17 0500 06/24/17 0334  Weight: 226 lb 9.6 oz (102.8 kg) 229 lb 8 oz (104.1 kg) 226 lb (102.5 kg)    Telemetry    NSR in the 80's - Personally Reviewed  ECG    No new tracings - Personally Reviewed  Physical Exam   GEN: No acute distress.   Neck: No  JVD Cardiac: RRR, no murmurs, rubs. Soft 2/6 systolic murmur in left lateral chest Respiratory: Clear to auscultation bilaterally except for few crackles in posterior bases. GI: Soft, nontender, non-distended  MS: trace-1+ lower extremity edema; Right foot wrapped. Neuro:  Nonfocal  Psych: Normal affect   Labs    Chemistry Recent Labs Lab 06/18/17 1057  06/21/17 0554 06/22/17 0538 06/23/17 0421  NA 137  < > 142 143 142  K >7.5*  < > 4.8 4.6 5.1  CL 115*  < > 113* 113* 112*  CO2 18*  < > 23 25 26   GLUCOSE 195*  < > 228* 99 133*  BUN 46*  < > 26* 25* 28*  CREATININE 1.47*  < > 0.92 0.83 0.81  CALCIUM 8.6*  < > 7.9* 8.2* 8.0*  PROT 7.1  --   --   --   --   ALBUMIN 3.4*  --   --   --   --   AST  20  --   --   --   --   ALT 21  --   --   --   --   ALKPHOS 75  --   --   --   --   BILITOT 0.2*  --   --   --   --   GFRNONAA 39*  < > >60 >60 >60  GFRAA 45*  < > >60 >60 >60  ANIONGAP 4*  < > 6 5 4*  < > = values in this interval not displayed.   Hematology Recent Labs Lab 06/18/17 1057 06/18/17 1344  WBC 6.5  --   RBC 3.59*  --   HGB 9.5* 10.2*  HCT 30.3* 30.0*  MCV 84.4  --   MCH 26.5  --   MCHC 31.4  --   RDW 15.0  --   PLT 267  --     Cardiac Enzymes Recent Labs Lab 06/18/17 2032 06/19/17 0206 06/19/17 0811  TROPONINI 0.30* 0.26* 0.25*   No results for input(s): TROPIPOC in the last 168 hours.   BNPNo results for input(s): BNP, PROBNP in the last 168 hours.   DDimer No results for input(s): DDIMER in the last 168 hours.   Radiology    US Venous Img Lower Unilateral Right  Result Date: 06/22/2017 CLINICAL DATA:  Right lower extremity edema and chronic diabetic ulceration. Color changes. EXAM: Right LOWER EXTREMITY VENOUS DOPPLER ULTRASOUND TECHNIQUE: Gray-scale sonography with graded compression, as well as color Doppler and duplex ultrasound were performed to evaluate the lower extremity deep venous systems from the level of the common femoral vein and  including the common femoral, femoral, profunda femoral, popliteal and calf veins including the posterior tibial, peroneal and gastrocnemius veins when visible. The superficial great saphenous vein was also interrogated. Spectral Doppler was utilized to evaluate flow at rest and with distal augmentation maneuvers in the common femoral, femoral and popliteal veins. COMPARISON:  None. FINDINGS: Contralateral Common Femoral Vein: Respiratory phasicity is normal and symmetric with the symptomatic side. No evidence of thrombus. Normal compressibility. Common Femoral Vein: No evidence of thrombus. Normal compressibility, respiratory phasicity and response to augmentation. Saphenofemoral Junction: No evidence of thrombus. Normal compressibility and flow on color Doppler imaging. Profunda Femoral Vein: No evidence of thrombus. Normal compressibility and flow on color Doppler imaging. Femoral Vein: No evidence of thrombus. Normal compressibility, respiratory phasicity and response to augmentation. Popliteal Vein: No evidence of thrombus. Normal compressibility, respiratory phasicity and response to augmentation. Calf Veins: No evidence of thrombus. Normal compressibility and flow on color Doppler imaging. Superficial Great Saphenous Vein: No evidence of thrombus. Normal compressibility and flow on color Doppler imaging. Venous Reflux:  None. Other Findings:  None. IMPRESSION: No evidence of DVT within the right lower extremity. Electronically Signed   By: Gaylyn Rong M.D.   On: 06/22/2017 11:15    Cardiac Studies   Echocardiogram 06/19/2017 Left ventricle: The cavity size was normal. Wall thickness was at the upper limits of normal. Systolic function was mildly reduced. The estimated ejection fraction was in the range of 45% to 50%. Diffuse hypokinesis. Features are consistent with a pseudonormal left ventricular filling pattern, with concomitant abnormal relaxation and increased filling pressure  (grade 2 diastolic dysfunction). - Mitral valve: There was moderate to severe regurgitation. - Left atrium: The atrium was moderately dilated. - Right atrium: Central venous pressure (est): 3 mm Hg. - Atrial septum: No defect or patent foramen ovale was identified. - Tricuspid valve: There  was mild regurgitation. - Pulmonary arteries: Systolic pressure was severely increased. PA peak pressure: 62 mm Hg (S). - Pericardium, extracardiac: There was no pericardial effusion.   Patient Profile     56 y.o. female with a history of Type 2 diabetes mellitus with peripheral neuropathy, hypertension, and hyperlipidemia admitted for hyperkalemia with K+ 7.5 who is being seen for the evaluation of exertional shortness of breath and chest discomfort. 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 Mechanism of her mitral regurgitation was not clear, no obvious leaflet incompetence or substantial annular dilatation was found. Recommended for TEE sometime this week if stable.  Assessment & Plan    Chest pain -Pt was admitted to [facility] with hyperkalemia and admitted to chest burning and some dyspnea on exertion. She was stabilized with her electrolytes and transferred to [facility] for further cardiac workup -Troponins minimally elevated in flat trend: 0.30, 0.26, 0.25.  BNP  175.0 -CVD risk factors include diabetes, hypertension, hyperlipidemia -Possible CAD indicators this admission include mild troponin elevation, EKG changes and mild LV dysfunction. An echocardiogram showed an LV EF of 45-50% as well as moderate to severe MR. Dr. branch felt that in the setting of moderate to severe MR her true LVEF may actually be lower than reported. Her symptoms may be related to CAD or symptomatic MR. -She continues to have mild  burning in the upper chest up to the throat and mild DOE with walking in the hall. -We will arrange for TEE to further evaluate her MR and then consider right and left heart cath given her pulmonary hypertension and continued symptoms. -SCr 0.81 -Current medical therapy includes aspirin, rosuvastatin 20 mg, metoprolol succinate 12.5 mg  Electrolyte abnormality -Potassium level was 7.5 on admission -Now resolved  Diabetes type 2 on insulin -Hemoglobin A1c 10.4 on 06/18/17, was previously 12.6 -Patient will need improved glucose control to reduce her cardiac risk  -Inpatient insulin and sliding scale per internal medicine  Hypertension -Home management includes lisinopril 5 mg and hydrochlorothiazide 25 mg -Current treatment includes metoprolol succinate 12.5 mg daily. Blood pressure is well controlled.  Hyperlipidemia -We'll check lipid panel for risk stratification -Continue Crestor 20 mg  Signed, Berton Bon, NP  06/24/2017, 7:31 AM    Patient examined chart reviewed MR murmur on exam obese hispanic female plus one LE edema. Will have TEE tomorrow and right and left cath Friday to assess decreased EF and significant MR Risks and procedures discussed with patient and husband willing to proceed  Jill [name]

## 2017-06-25 ENCOUNTER — Encounter (HOSPITAL_COMMUNITY)
Admission: EM | Disposition: A | Discharge status: Home / Self Care | Payer: Self-pay | Source: Home / Self Care | Attending: Internal Medicine

## 2017-06-25 ENCOUNTER — Inpatient Hospital Stay (HOSPITAL_COMMUNITY): Payer: Medicaid Other

## 2017-06-25 ENCOUNTER — Encounter (HOSPITAL_COMMUNITY): Payer: Self-pay | Admitting: *Deleted

## 2017-06-25 DIAGNOSIS — I4892 Unspecified atrial flutter: Secondary | ICD-10-CM

## 2017-06-25 DIAGNOSIS — I340 Nonrheumatic mitral (valve) insufficiency: Secondary | ICD-10-CM

## 2017-06-25 HISTORY — PX: TEE WITHOUT CARDIOVERSION: SHX5443

## 2017-06-25 LAB — COMPREHENSIVE METABOLIC PANEL
ALK PHOS: 75 U/L (ref 38–126)
ALT: 38 U/L (ref 14–54)
AST: 34 U/L (ref 15–41)
Albumin: 2.7 g/dL — ABNORMAL LOW (ref 3.5–5.0)
Anion gap: 4 — ABNORMAL LOW (ref 5–15)
BUN: 29 mg/dL — ABNORMAL HIGH (ref 6–20)
CHLORIDE: 113 mmol/L — AB (ref 101–111)
CO2: 25 mmol/L (ref 22–32)
CREATININE: 0.85 mg/dL (ref 0.44–1.00)
Calcium: 8.2 mg/dL — ABNORMAL LOW (ref 8.9–10.3)
GFR calc Af Amer: >60 mL/min (ref >60)
GFR calc non Af Amer: >60 mL/min (ref >60)
Glucose, Bld: 158 mg/dL — ABNORMAL HIGH (ref 65–99)
Potassium: 5 mmol/L (ref 3.5–5.1)
SODIUM: 142 mmol/L (ref 135–145)
Total Bilirubin: 0.6 mg/dL (ref 0.3–1.2)
Total Protein: 6 g/dL — ABNORMAL LOW (ref 6.5–8.1)

## 2017-06-25 LAB — GLUCOSE, CAPILLARY
GLUCOSE-CAPILLARY: 131 mg/dL — AB (ref 65–99)
GLUCOSE-CAPILLARY: 105 mg/dL — AB (ref 65–99)
GLUCOSE-CAPILLARY: 79 mg/dL (ref 65–99)
GLUCOSE-CAPILLARY: 302 mg/dL — AB (ref 65–99)

## 2017-06-25 LAB — LIPID PANEL
Cholesterol: 105 mg/dL (ref 0–200)
HDL: 33 mg/dL — ABNORMAL LOW (ref >40)
LDL CALC: 58 mg/dL (ref 0–99)
Total CHOL/HDL Ratio: 3.2 RATIO
Triglycerides: 69 mg/dL (ref <150)
VLDL: 14 mg/dL (ref 0–40)

## 2017-06-25 SURGERY — ECHOCARDIOGRAM, TRANSESOPHAGEAL
Anesthesia: Moderate Sedation

## 2017-06-25 MED ORDER — MIDAZOLAM HCL 10 MG/2ML IJ SOLN
INTRAMUSCULAR | Status: DC | PRN
  Administered 2017-06-25: 1 mg via INTRAVENOUS
  Administered 2017-06-25 (×2): 2 mg via INTRAVENOUS

## 2017-06-25 MED ORDER — FENTANYL CITRATE (PF) 100 MCG/2ML IJ SOLN
INTRAMUSCULAR | Status: AC
  Filled 2017-06-25: qty 2

## 2017-06-25 MED ORDER — SODIUM CHLORIDE 0.9 % IV SOLN: INTRAVENOUS | Status: DC

## 2017-06-25 MED ORDER — FUROSEMIDE 10 MG/ML IJ SOLN
20.0000 mg | Freq: Once | INTRAMUSCULAR | Status: AC
  Administered 2017-06-25: 20 mg via INTRAVENOUS
  Filled 2017-06-25: qty 2

## 2017-06-25 MED ORDER — BUTAMBEN-TETRACAINE-BENZOCAINE 2-2-14 % EX AERO
INHALATION_SPRAY | CUTANEOUS | Status: DC | PRN
  Administered 2017-06-25: 2 via TOPICAL

## 2017-06-25 MED ORDER — FENTANYL CITRATE (PF) 100 MCG/2ML IJ SOLN
INTRAMUSCULAR | Status: DC | PRN
  Administered 2017-06-25 (×2): 25 ug via INTRAVENOUS

## 2017-06-25 MED ORDER — MIDAZOLAM HCL 5 MG/ML IJ SOLN
INTRAMUSCULAR | Status: AC
  Filled 2017-06-25: qty 2

## 2017-06-25 MED ORDER — DIPHENHYDRAMINE HCL 50 MG/ML IJ SOLN
INTRAMUSCULAR | Status: AC
  Filled 2017-06-25: qty 1

## 2017-06-25 MED ORDER — SENNOSIDES-DOCUSATE SODIUM 8.6-50 MG PO TABS
1.0000 | ORAL_TABLET | Freq: Two times a day (BID) | ORAL | Status: DC
  Administered 2017-06-25 – 2017-06-29 (×7): 1 via ORAL
  Filled 2017-06-25 (×7): qty 1

## 2017-06-25 NOTE — Progress Notes (Signed)
Progress Note  Patient Name: Jill [name] Date of Encounter: 06/25/2017  Primary Cardiologist:  Dr Diona Browner  Subjective   The patient is still having some mild upper chest burning that goes to the throat and mild DOE with walking. None at rest. She had difficulty breathing last night while flat for dressing change.   Inpatient Medications    Scheduled Meds: . aspirin EC  81 mg Oral Daily  . collagenase   Topical Daily  . enoxaparin (LOVENOX) injection  50 mg Subcutaneous Q24H  . gabapentin  400 mg Oral TID  . insulin aspart  0-15 Units Subcutaneous TID WC  . insulin aspart  0-5 Units Subcutaneous QHS  . insulin aspart  4 Units Subcutaneous TID WC  . insulin detemir  15 Units Subcutaneous BID  . living well with diabetes book- in spanish   Does not apply Once  . loratadine  10 mg Oral Daily  . metoprolol succinate  12.5 mg Oral Daily  . rosuvastatin  20 mg Oral Daily   Continuous Infusions: . [START ON 06/26/2017] sodium chloride Stopped (06/25/17 0708)   PRN Meds: acetaminophen **OR** acetaminophen, ondansetron **OR** ondansetron (ZOFRAN) IV   Vital Signs    Vitals:   06/24/17 1151 06/24/17 1732 06/24/17 2035 06/25/17 0653  BP: 127/72 (!) 147/83 132/67 121/63  Pulse: 79 84 83 75  Resp: Temp: 98 F (36.7 C) 97.6 F (36.4 C) 97.8 F (36.6 C) 98.7 F (37.1 C)  TempSrc: Oral Oral Oral Oral  SpO2: 98% 98% 98% 92%  Weight:    226 lb 11.2 oz (102.8 kg)  Height:        Intake/Output Summary (Last 24 hours) at 06/25/17 0820 Last data filed at 06/25/17 0440  Gross per 24 hour  Intake              360 ml  Output              895 ml  Net             -535 ml   Filed Weights   06/23/17 0500 06/24/17 0334 06/25/17 0653  Weight: 229 lb 8 oz (104.1 kg) 226 lb (102.5 kg) 226 lb 11.2 oz (102.8 kg)    Telemetry    NSR in the 70's - Personally Reviewed  ECG    No new tracings - Personally Reviewed  Physical Exam   GEN: No acute distress.   Neck: No  JVD Cardiac: RRR, Soft 2/6 systolic murmur in left lateral chest Respiratory: Clear to auscultation bilaterally except for few crackles in posterior bases. GI: Soft, nontender, non-distended  MS: 1+ lower extremity edema; Right foot wrapped. Neuro:  Nonfocal  Psych: Normal affect   Labs    Chemistry Recent Labs Lab 06/18/17 1057  06/22/17 0538 06/23/17 0421 06/25/17 0532  NA 137  < > 143 142 142  K >7.5*  < > 4.6 5.1 5.0  CL 115*  < > 113* 112* 113*  CO2 18*  < > GLUCOSE 195*  < > 99 133* 158*  BUN 46*  < > 25* 28* 29*  CREATININE 1.47*  < > 0.83 0.81 0.85  CALCIUM 8.6*  < > 8.2* 8.0* 8.2*  PROT 7.1  --   --   --  6.0*  ALBUMIN 3.4*  --   --   --  2.7*  AST 20  --   --   --  34  ALT 21  --   --   --  38  ALKPHOS 75  --   --   --  75  BILITOT 0.2*  --   --   --  0.6  GFRNONAA 39*  < > >60 >60 >60  GFRAA 45*  < > >60 >60 >60  ANIONGAP 4*  < > 5 4* 4*  < > = values in this interval not displayed.   Hematology  Recent Labs Lab 06/18/17 1057 06/18/17 1344  WBC 6.5  --   RBC 3.59*  --   HGB 9.5* 10.2*  HCT 30.3* 30.0*  MCV 84.4  --   MCH 26.5  --   MCHC 31.4  --   RDW 15.0  --   PLT 267  --     Cardiac Enzymes  Recent Labs Lab 06/18/17 2032 06/19/17 0206 06/19/17 0811  TROPONINI 0.30* 0.26* 0.25*   No results for input(s): TROPIPOC in the last 168 hours.   BNPNo results for input(s): BNP, PROBNP in the last 168 hours.   DDimer No results for input(s): DDIMER in the last 168 hours.   Radiology    No results found.  Cardiac Studies   Echocardiogram 06/19/2017 Left ventricle: The cavity size was normal. Wall thickness was at the upper limits of normal. Systolic function was mildly reduced. The estimated ejection fraction was in the range of 45% to 50%. Diffuse hypokinesis. Features are consistent with a pseudonormal left ventricular filling pattern, with concomitant abnormal relaxation and increased filling pressure (grade 2  diastolic dysfunction). - Mitral valve: There was moderate to severe regurgitation. - Left atrium: The atrium was moderately dilated. - Right atrium: Central venous pressure (est): 3 mm Hg. - Atrial septum: No defect or patent foramen ovale was identified. - Tricuspid valve: There was mild regurgitation. - Pulmonary arteries: Systolic pressure was severely increased. PA peak pressure: 62 mm Hg (S). - Pericardium, extracardiac: There was no pericardial effusion.   Patient Profile     56 y.o. female with a history of Type 2 diabetes mellitus with peripheral neuropathy, hypertension, and hyperlipidemia admitted for hyperkalemia with K+ 7.5 who is being seen for the evaluation of exertional shortness of breath and chest discomfort. 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 Mechanism of her mitral regurgitation was not clear, no obvious leaflet incompetence or substantial annular dilatation was found. Recommended for TEE sometime this week if stable.  Assessment & Plan    Chest pain -Pt was admitted to [facility] with hyperkalemia and admitted to chest burning and some dyspnea on exertion. She was stabilized with her electrolytes and transferred to [facility] for further cardiac workup -Troponins minimally elevated in flat trend: 0.30, 0.26, 0.25.  BNP  175.0 -CVD risk factors include diabetes, hypertension, hyperlipidemia -Possible CAD indicators this admission include mild troponin elevation, EKG changes and mild LV dysfunction. An echocardiogram showed an LV EF of 45-50% as well as moderate to severe MR. Dr. branch felt that in the setting of moderate to severe MR her true LVEF may actually be lower than reported. Her symptoms may be related to CAD or symptomatic MR. -She continues to have mild burning in the  upper chest up to the throat and mild DOE with walking in the hall. -Complains of orthopnea. Has 1+ pedal edema and crackles in lung bases. Will give Lasix 20 mg  IV. -Current medical therapy includes aspirin, rosuvastatin 20 mg, metoprolol succinate 12.5 mg -Plan for R&L heart cath tomorrow. Risks and procedures discussed with patient and husband by Dr. Eden Emms and they are willing to proceed.  Mitral regurgitation -Murmur present, Mod-severe MR on echo 06/19/17 -Plan for TEE today to further evaluate her MR and then right and left heart cath given her pulmonary hypertension and continued symptoms. -SCr 0.85  Electrolyte abnormality -Potassium level was 7.5 on admission -Now resolved  Diabetes type 2 on insulin -Hemoglobin A1c 10.4 on 06/18/17, was previously 12.6 -Patient will need improved glucose control to reduce her cardiac risk  -Inpatient insulin and sliding scale per internal medicine  Hypertension -Home management includes lisinopril 5 mg and hydrochlorothiazide 25 mg -Current treatment includes metoprolol succinate 12.5 mg daily. Blood pressure is well controlled.  Hyperlipidemia -We'll check lipid panel for risk stratification -Continue Crestor 20 mg  Signed, Berton Bon, NP  06/25/2017, 8:20 AM    Patient examined chart reviewed MR murmur on exam Obese hispanic female. Lungs with basilar crackles MIld elevation in troponin with no trend For TEE today to assess MR and then right and left cath in am to r/o CAD and assess right heart pressures  Charlton Haws

## 2017-06-25 NOTE — Progress Notes (Signed)
  Echocardiogram Echocardiogram Transesophageal has been performed.  [name] 06/25/2017, 3:22 PM

## 2017-06-25 NOTE — H&P (View-Only) (Signed)
Progress Note  Patient Name: Jill [name] Date of Encounter: 06/25/2017  Primary Cardiologist:  Dr Diona Browner  Subjective   The patient is still having some mild upper chest burning that goes to the throat and mild DOE with walking. None at rest. She had difficulty breathing last night while flat for dressing change.   Inpatient Medications    Scheduled Meds: . aspirin EC  81 mg Oral Daily  . collagenase   Topical Daily  . enoxaparin (LOVENOX) injection  50 mg Subcutaneous Q24H  . gabapentin  400 mg Oral TID  . insulin aspart  0-15 Units Subcutaneous TID WC  . insulin aspart  0-5 Units Subcutaneous QHS  . insulin aspart  4 Units Subcutaneous TID WC  . insulin detemir  15 Units Subcutaneous BID  . living well with diabetes book- in spanish   Does not apply Once  . loratadine  10 mg Oral Daily  . metoprolol succinate  12.5 mg Oral Daily  . rosuvastatin  20 mg Oral Daily   Continuous Infusions: . [START ON 06/26/2017] sodium chloride Stopped (06/25/17 0708)   PRN Meds: acetaminophen **OR** acetaminophen, ondansetron **OR** ondansetron (ZOFRAN) IV   Vital Signs    Vitals:   06/24/17 1151 06/24/17 1732 06/24/17 2035 06/25/17 0653  BP: 127/72 (!) 147/83 132/67 121/63  Pulse: 79 84 83 75  Resp: Temp: 98 F (36.7 C) 97.6 F (36.4 C) 97.8 F (36.6 C) 98.7 F (37.1 C)  TempSrc: Oral Oral Oral Oral  SpO2: 98% 98% 98% 92%  Weight:    226 lb 11.2 oz (102.8 kg)  Height:        Intake/Output Summary (Last 24 hours) at 06/25/17 0820 Last data filed at 06/25/17 0440  Gross per 24 hour  Intake              360 ml  Output              895 ml  Net             -535 ml   Filed Weights   06/23/17 0500 06/24/17 0334 06/25/17 0653  Weight: 229 lb 8 oz (104.1 kg) 226 lb (102.5 kg) 226 lb 11.2 oz (102.8 kg)    Telemetry    NSR in the 70's - Personally Reviewed  ECG    No new tracings - Personally Reviewed  Physical Exam   GEN: No acute distress.   Neck: No  JVD Cardiac: RRR, Soft 2/6 systolic murmur in left lateral chest Respiratory: Clear to auscultation bilaterally except for few crackles in posterior bases. GI: Soft, nontender, non-distended  MS: 1+ lower extremity edema; Right foot wrapped. Neuro:  Nonfocal  Psych: Normal affect   Labs    Chemistry Recent Labs Lab 06/18/17 1057  06/22/17 0538 06/23/17 0421 06/25/17 0532  NA 137  < > 143 142 142  K >7.5*  < > 4.6 5.1 5.0  CL 115*  < > 113* 112* 113*  CO2 18*  < > GLUCOSE 195*  < > 99 133* 158*  BUN 46*  < > 25* 28* 29*  CREATININE 1.47*  < > 0.83 0.81 0.85  CALCIUM 8.6*  < > 8.2* 8.0* 8.2*  PROT 7.1  --   --   --  6.0*  ALBUMIN 3.4*  --   --   --  2.7*  AST 20  --   --   --  34  ALT 21  --   --   --  38  ALKPHOS 75  --   --   --  75  BILITOT 0.2*  --   --   --  0.6  GFRNONAA 39*  < > >60 >60 >60  GFRAA 45*  < > >60 >60 >60  ANIONGAP 4*  < > 5 4* 4*  < > = values in this interval not displayed.   Hematology  Recent Labs Lab 06/18/17 1057 06/18/17 1344  WBC 6.5  --   RBC 3.59*  --   HGB 9.5* 10.2*  HCT 30.3* 30.0*  MCV 84.4  --   MCH 26.5  --   MCHC 31.4  --   RDW 15.0  --   PLT 267  --     Cardiac Enzymes  Recent Labs Lab 06/18/17 2032 06/19/17 0206 06/19/17 0811  TROPONINI 0.30* 0.26* 0.25*   No results for input(s): TROPIPOC in the last 168 hours.   BNPNo results for input(s): BNP, PROBNP in the last 168 hours.   DDimer No results for input(s): DDIMER in the last 168 hours.   Radiology    No results found.  Cardiac Studies   Echocardiogram 06/19/2017 Left ventricle: The cavity size was normal. Wall thickness was at the upper limits of normal. Systolic function was mildly reduced. The estimated ejection fraction was in the range of 45% to 50%. Diffuse hypokinesis. Features are consistent with a pseudonormal left ventricular filling pattern, with concomitant abnormal relaxation and increased filling pressure (grade 2  diastolic dysfunction). - Mitral valve: There was moderate to severe regurgitation. - Left atrium: The atrium was moderately dilated. - Right atrium: Central venous pressure (est): 3 mm Hg. - Atrial septum: No defect or patent foramen ovale was identified. - Tricuspid valve: There was mild regurgitation. - Pulmonary arteries: Systolic pressure was severely increased. PA peak pressure: 62 mm Hg (S). - Pericardium, extracardiac: There was no pericardial effusion.   Patient Profile     56 y.o. female with a history of Type 2 diabetes mellitus with peripheral neuropathy, hypertension, and hyperlipidemia admitted for hyperkalemia with K+ 7.5 who is being seen for the evaluation of exertional shortness of breath and chest discomfort. 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 Mechanism of her mitral regurgitation was not clear, no obvious leaflet incompetence or substantial annular dilatation was found. Recommended for TEE sometime this week if stable.  Assessment & Plan    Chest pain -Pt was admitted to [facility] with hyperkalemia and admitted to chest burning and some dyspnea on exertion. She was stabilized with her electrolytes and transferred to [facility] for further cardiac workup -Troponins minimally elevated in flat trend: 0.30, 0.26, 0.25.  BNP  175.0 -CVD risk factors include diabetes, hypertension, hyperlipidemia -Possible CAD indicators this admission include mild troponin elevation, EKG changes and mild LV dysfunction. An echocardiogram showed an LV EF of 45-50% as well as moderate to severe MR. Dr. branch felt that in the setting of moderate to severe MR her true LVEF may actually be lower than reported. Her symptoms may be related to CAD or symptomatic MR. -She continues to have mild burning in the  upper chest up to the throat and mild DOE with walking in the hall. -Complains of orthopnea. Has 1+ pedal edema and crackles in lung bases. Will give Lasix 20 mg  IV. -Current medical therapy includes aspirin, rosuvastatin 20 mg, metoprolol succinate 12.5 mg -Plan for R&L heart cath tomorrow. Risks and procedures discussed with patient and husband by Dr. Viktorya Arguijo and they are willing to proceed.  Mitral regurgitation -Murmur present, Mod-severe MR on echo 06/19/17 -Plan for TEE today to further evaluate her MR and then right and left heart cath given her pulmonary hypertension and continued symptoms. -SCr 0.85  Electrolyte abnormality -Potassium level was 7.5 on admission -Now resolved  Diabetes type 2 on insulin -Hemoglobin A1c 10.4 on 06/18/17, was previously 12.6 -Patient will need improved glucose control to reduce her cardiac risk  -Inpatient insulin and sliding scale per internal medicine  Hypertension -Home management includes lisinopril 5 mg and hydrochlorothiazide 25 mg -Current treatment includes metoprolol succinate 12.5 mg daily. Blood pressure is well controlled.  Hyperlipidemia -We'll check lipid panel for risk stratification -Continue Crestor 20 mg  Signed, Janine Hammond, NP  06/25/2017, 8:20 AM    Patient examined chart reviewed MR murmur on exam Obese hispanic female. Lungs with basilar crackles MIld elevation in troponin with no trend For TEE today to assess MR and then right and left cath in am to r/o CAD and assess right heart pressures  Jill [name]  

## 2017-06-25 NOTE — Progress Notes (Signed)
Dressing changed to right heel per order. Pt tolerated well.

## 2017-06-25 NOTE — Progress Notes (Signed)
Inpatient Diabetes Program Recommendations  AACE/ADA: New Consensus Statement on Inpatient Glycemic Control (2015)  Target Ranges:  Prepandial:   less than 140 mg/dL      Peak postprandial:   less than 180 mg/dL (1-2 hours)      Critically ill patients:  140 - 180 mg/dL   Results for Jill [name], Jill [name] (MRN [id number]) as of 06/25/2017 09:57  Ref. Range 06/24/2017 07:49 06/24/2017 11:48 06/24/2017 16:13 06/24/2017 20:32 06/25/2017 07:47  Glucose-Capillary Latest Ref Range: 65 - 99 mg/dL 162 (H) Meal coverage not given NPO 193 (H) 227 (H) 254 (H) 131 (H)   Review of Glycemic Control  Diabetes history: DM2 Outpatient Diabetes medications: Levemir 25 units bid + Trulicity 0.75 qd + Janumet (sitagliptin + Metformin) bid Current orders for Inpatient glycemic control: Levemir 15 bid + Novolog moderate correction 0-15 units tid + Novolog 0-5 units hs + Novolog 4 units tid meal covergae  Inpatient Diabetes Program Recommendations:  Glucose increase with meals with Novolog 4 units meal coverage. Consider increasing Novolog meal coverage to 6 units tid if patient consumes at least 50% of meals.  Thanks,  Christena Deem RN, MSN, [facility] Inpatient Diabetes Coordinator Team Pager [phone] (8a-5p)

## 2017-06-25 NOTE — Interval H&P Note (Signed)
History and Physical Interval Note:  06/25/2017 1:14 PM  Jill [name]  has presented today for surgery, with the diagnosis of mitral valve accessment  The various methods of treatment have been discussed with the patient and family. After consideration of risks, benefits and other options for treatment, the patient has consented to  Procedure(s): TRANSESOPHAGEAL ECHOCARDIOGRAM (TEE) (N/A) as a surgical intervention .  The patient's history has been reviewed, patient examined, no change in status, stable for surgery.  I have reviewed the patient's chart and labs.  Questions were answered to the patient's satisfaction.     Jowana Thumma

## 2017-06-25 NOTE — Progress Notes (Signed)
PROGRESS NOTE  Jill [name] NOT:[id number] DOB: 1961-03-19 DOA: 06/18/2017 PCP: Health, [facility]  HPI/Recap of past 24 hours:  Returned from TEE, husband at bed side Currently denies chest pain , no fever, no cough   Assessment/Plan: Active Problems:   Essential hypertension   Hyperkalemia   Type 2 diabetes mellitus, uncontrolled, with neuropathy (HCC)   Hyperlipidemia   Chest pain   Elevated troponin   CKD (chronic kidney disease) stage 3, GFR 30-59 ml/min   Pressure injury of skin   Cardiomyopathy (HCC)   Mitral regurgitation   Hyperkalemia  -she [facility] department for diabetic foot ulcers, routine work up showed hyperkalemia, she is referred to [facility].  -Likely multifactorial including medications (lisinopril, ibuprofen), AKI, exogenous intake from food,  RTA type 4 and relative insulin deficiency -she received kayexalate/calciun gluconate/bicarb/albuterol ---lisinopril /ibuprofen discontinued  - K is improved -consult dietician to help with education for avoiding K rich foods -am BMP  Chest pain, Cardiomyopathy--newly diagnosed/pulmonary hypertension --06/19/17 Echo--EF 45-at the percent, grade 2 DD, moderate to severe MR, PASP 62 -may be due to mitral regurg vs ischemic -appreciate cardiology eval-->TEE on 9/6, and cardiac cath Friday  -she is currently on asa/statin/betablocker   acute on chronic chf vs fluids overload?  -there is documented brief hypoxia initially on presentation, currently on room air, no hypoxia - cxr on 9/1 concerns for mild chf "with acute moderate diffuse interstitial pulmonary edema which is new since 2 days ago" -she does has bilateral lower extremity pitting edema (R>L) on exam today, she reports this is new started a few days ago -Venous doppler negative for DVT on right lower extremity -prn lasix, close monitor volume status, cardiology following    AKI on CKD stage II --Discontinue  lisinopril, ibuprofen  -CPK 98 -Renal ultrasound--neg for hydronephrosis -Cr peaked at 1.47, cr normalized around  0.8 since 9/1, ua was not done on admission  Hyperlipidemia - fasting lipid ldl 58, hdl 33 -Continue statin  Insulin dependent iabetes mellitus type 2 with neuropathy -she has been on insulin , GLP1 agonists injection and  Oral meds with dpp4inhibitor/metformin -Hemoglobin A1c--10.4 -Continue gabapentin -she has been on insulin here, diabetes education -expect resume home meds at discharge.   Diabetic foot ulcers -Do not appear to be infected on clinical exam -wound care consult appreciate -d/c cephlexin    Code Status: full  Family Communication: patient and family at bedside  Disposition Plan: not ready for discharge, need cardiology clearance prior to discharge. may need home  Health at discharge   Consultants:  cardiology  Procedures:  TEE on 9/6   plan for cardiac cath  Antibiotics:  none   Objective: BP 121/63 (BP Location: Right Arm)   Pulse 75   Temp 98.7 F (37.1 C) (Oral)   Resp 20   Ht 5\' 3"  (1.6 m)   Wt 102.8 kg (226 lb 11.2 oz) Comment: scale a  SpO2 92%   BMI 40.16 kg/m   Intake/Output Summary (Last 24 hours) at 06/25/17 0751 Last data filed at 06/25/17 0440  Gross per 24 hour  Intake              360 ml  Output              895 ml  Net             -535 ml   Filed Weights   06/23/17 0500 06/24/17 0334 06/25/17 0653  Weight: 104.1 kg (  229 lb 8 oz) 102.5 kg (226 lb) 102.8 kg (226 lb 11.2 oz)    Exam: Patient is examined daily including today on 06/25/2017, exams remain the same as of yesterday except that has changed    General:  NAD  Cardiovascular: RRR  Respiratory: diminished at basis with mild bibasilar crackles, no wheezing, no rhonchi  Abdomen: Soft/ND/NT, positive BS  Musculoskeletal: bilateral legt pitting Edema, right > left, chronic right foot ulcer ( she reports stepped on a stone)  Neuro:  alert, oriented   Data Reviewed: Basic Metabolic Panel:  Recent Labs Lab 06/20/17 0436 06/21/17 0554 06/22/17 0538 06/23/17 0421 06/25/17 0532  NA 143 142 143 142 142  K 4.9 4.8 4.6 5.1 5.0  CL 116* 113* 113* 112* 113*  CO2 GLUCOSE 88 228* 99 133* 158*  BUN 34* 26* 25* 28* 29*  CREATININE 0.80 0.92 0.83 0.81 0.85  CALCIUM 8.2* 7.9* 8.2* 8.0* 8.2*  MG 2.1  --   --   --   --    Liver Function Tests:  Recent Labs Lab 06/18/17 1057 06/25/17 0532  AST 20 34  ALT 21 38  ALKPHOS 75 75  BILITOT 0.2* 0.6  PROT 7.1 6.0*  ALBUMIN 3.4* 2.7*   No results for input(s): LIPASE, AMYLASE in the last 168 hours. No results for input(s): AMMONIA in the last 168 hours. CBC:  Recent Labs Lab 06/18/17 1057 06/18/17 1344  WBC 6.5  --   NEUTROABS 2.8  --   HGB 9.5* 10.2*  HCT 30.3* 30.0*  MCV 84.4  --   PLT 267  --    Cardiac Enzymes:    Recent Labs Lab 06/18/17 2032 06/19/17 0206 06/19/17 0811  CKTOTAL 98  --   --   TROPONINI 0.30* 0.26* 0.25*   BNP (last 3 results) No results for input(s): BNP in the last 8760 hours.  ProBNP (last 3 results) No results for input(s): PROBNP in the last 8760 hours.  CBG:  Recent Labs Lab 06/24/17 0749 06/24/17 1148 06/24/17 1613 06/24/17 2032 06/25/17 0747  GLUCAP 101* 193* 227* 254* 131*    No results found for this or any previous visit (from the past 240 hour(s)).   Studies: No results found.  Scheduled Meds: . aspirin EC  81 mg Oral Daily  . collagenase   Topical Daily  . enoxaparin (LOVENOX) injection  50 mg Subcutaneous Q24H  . gabapentin  400 mg Oral TID  . insulin aspart  0-15 Units Subcutaneous TID WC  . insulin aspart  0-5 Units Subcutaneous QHS  . insulin aspart  4 Units Subcutaneous TID WC  . insulin detemir  15 Units Subcutaneous BID  . living well with diabetes book- in spanish   Does not apply Once  . loratadine  10 mg Oral Daily  . metoprolol succinate  12.5 mg Oral Daily  .  rosuvastatin  20 mg Oral Daily    Continuous Infusions: . [START ON 06/26/2017] sodium chloride Stopped (06/25/17 0708)     Time spent: I have personally reviewed and interpreted on  06/25/2017 daily labs, tele strips, imagings as discussed above under data review session and assessment and plans.  I reviewed all nursing notes, pharmacy notes, consultant notes,  vitals, pertinent old records  I have discussed plan of care as described above with RN , patient and family on 06/25/2017   Aman Bonet MD, PhD  [facility] Pager [phone]. If 7PM-7AM, please contact night-coverage at [email or web address],  password [email or web address] 06/25/2017, 7:51 AM  LOS: 1 day

## 2017-06-25 NOTE — Progress Notes (Signed)
PT back to floor from TEE. PT in stable condition. Paged Card NP for diet order.

## 2017-06-26 ENCOUNTER — Encounter (HOSPITAL_COMMUNITY)
Admission: EM | Disposition: A | Discharge status: Home / Self Care | Payer: Self-pay | Source: Home / Self Care | Attending: Internal Medicine

## 2017-06-26 ENCOUNTER — Other Ambulatory Visit: Payer: Self-pay | Admitting: *Deleted

## 2017-06-26 DIAGNOSIS — I483 Typical atrial flutter: Secondary | ICD-10-CM

## 2017-06-26 DIAGNOSIS — I2510 Atherosclerotic heart disease of native coronary artery without angina pectoris: Secondary | ICD-10-CM

## 2017-06-26 DIAGNOSIS — I340 Nonrheumatic mitral (valve) insufficiency: Secondary | ICD-10-CM

## 2017-06-26 HISTORY — PX: RIGHT/LEFT HEART CATH AND CORONARY ANGIOGRAPHY: CATH118266

## 2017-06-26 LAB — CBC
HEMATOCRIT: 27.1 % — AB (ref 36.0–46.0)
Hemoglobin: 8.5 g/dL — ABNORMAL LOW (ref 12.0–15.0)
MCH: 26.5 pg (ref 26.0–34.0)
MCHC: 31.4 g/dL (ref 30.0–36.0)
MCV: 84.4 fL (ref 78.0–100.0)
Platelets: 273 10*3/uL (ref 150–400)
RBC: 3.21 MIL/uL — AB (ref 3.87–5.11)
RDW: 15.2 % (ref 11.5–15.5)
WBC: 8.3 10*3/uL (ref 4.0–10.5)

## 2017-06-26 LAB — PROTIME-INR
INR: 1
Prothrombin Time: 13.1 seconds (ref 11.4–15.2)

## 2017-06-26 LAB — POCT I-STAT 3, ART BLOOD GAS (G3+)
ACID-BASE DEFICIT: 1 mmol/L (ref 0.0–2.0)
Bicarbonate: 25.2 mmol/L (ref 20.0–28.0)
O2 Saturation: 89 %
PH ART: 7.336 — AB (ref 7.350–7.450)
PO2 ART: 62 mmHg — AB (ref 83.0–108.0)
TCO2: 27 mmol/L (ref 22–32)
pCO2 arterial: 47.2 mmHg (ref 32.0–48.0)

## 2017-06-26 LAB — BASIC METABOLIC PANEL WITH GFR
Anion gap: 6 (ref 5–15)
BUN: 29 mg/dL — ABNORMAL HIGH (ref 6–20)
CO2: 26 mmol/L (ref 22–32)
Calcium: 8.2 mg/dL — ABNORMAL LOW (ref 8.9–10.3)
Chloride: 109 mmol/L (ref 101–111)
Creatinine, Ser: 0.94 mg/dL (ref 0.44–1.00)
GFR calc Af Amer: >60 mL/min
GFR calc non Af Amer: >60 mL/min
Glucose, Bld: 191 mg/dL — ABNORMAL HIGH (ref 65–99)
Potassium: 5 mmol/L (ref 3.5–5.1)
Sodium: 141 mmol/L (ref 135–145)

## 2017-06-26 LAB — POCT I-STAT 3, VENOUS BLOOD GAS (G3P V)
BICARBONATE: 26.4 mmol/L (ref 20.0–28.0)
Bicarbonate: 26.7 mmol/L (ref 20.0–28.0)
O2 Saturation: 48 %
O2 Saturation: 50 %
PCO2 VEN: 53.2 mmHg (ref 44.0–60.0)
PH VEN: 7.309 (ref 7.250–7.430)
PH VEN: 7.301 (ref 7.250–7.430)
PO2 VEN: 30 mmHg — AB (ref 32.0–45.0)
TCO2: 28 mmol/L (ref 22–32)
TCO2: 28 mmol/L (ref 22–32)
pCO2, Ven: 53.6 mmHg (ref 44.0–60.0)
pO2, Ven: 29 mmHg — CL (ref 32.0–45.0)

## 2017-06-26 LAB — GLUCOSE, CAPILLARY
GLUCOSE-CAPILLARY: 114 mg/dL — AB (ref 65–99)
GLUCOSE-CAPILLARY: 104 mg/dL — AB (ref 65–99)
Glucose-Capillary: 145 mg/dL — ABNORMAL HIGH (ref 65–99)
Glucose-Capillary: 259 mg/dL — ABNORMAL HIGH (ref 65–99)

## 2017-06-26 SURGERY — RIGHT/LEFT HEART CATH AND CORONARY ANGIOGRAPHY
Anesthesia: LOCAL

## 2017-06-26 MED ORDER — IOPAMIDOL (ISOVUE-370) INJECTION 76%
INTRAVENOUS | Status: AC
Start: 2017-06-26 — End: 2017-06-26
  Filled 2017-06-26: qty 100

## 2017-06-26 MED ORDER — INSULIN DETEMIR 100 UNIT/ML ~~LOC~~ SOLN
20.0000 [IU] | Freq: Two times a day (BID) | SUBCUTANEOUS | Status: DC
  Administered 2017-06-26 – 2017-06-29 (×6): 20 [IU] via SUBCUTANEOUS
  Filled 2017-06-26 (×7): qty 0.2

## 2017-06-26 MED ORDER — SODIUM CHLORIDE 0.9% FLUSH
3.0000 mL | Freq: Two times a day (BID) | INTRAVENOUS | Status: DC
  Administered 2017-06-26 – 2017-06-29 (×6): 3 mL via INTRAVENOUS

## 2017-06-26 MED ORDER — NITROGLYCERIN 1 MG/10 ML FOR IR/CATH LAB
INTRA_ARTERIAL | Status: AC
Start: 2017-06-26 — End: 2017-06-26
  Filled 2017-06-26: qty 10

## 2017-06-26 MED ORDER — SODIUM CHLORIDE 0.9 % WEIGHT BASED INFUSION: 1.0000 mL/kg/h | INTRAVENOUS | Status: DC

## 2017-06-26 MED ORDER — SODIUM CHLORIDE 0.9% FLUSH
3.0000 mL | Freq: Two times a day (BID) | INTRAVENOUS | Status: DC
  Administered 2017-06-26: 3 mL via INTRAVENOUS

## 2017-06-26 MED ORDER — NITROGLYCERIN 1 MG/10 ML FOR IR/CATH LAB
INTRA_ARTERIAL | Status: DC | PRN
  Administered 2017-06-26: 200 ug via INTRACORONARY

## 2017-06-26 MED ORDER — ASPIRIN 81 MG PO CHEW: 81.0000 mg | CHEWABLE_TABLET | ORAL | Status: DC

## 2017-06-26 MED ORDER — ENOXAPARIN SODIUM 60 MG/0.6ML ~~LOC~~ SOLN
50.0000 mg | SUBCUTANEOUS | Status: DC
  Administered 2017-06-27 (×2): 50 mg via SUBCUTANEOUS
  Filled 2017-06-26 (×3): qty 0.6

## 2017-06-26 MED ORDER — HEPARIN SODIUM (PORCINE) 1000 UNIT/ML IJ SOLN
INTRAMUSCULAR | Status: AC
Start: 2017-06-26 — End: 2017-06-26
  Filled 2017-06-26: qty 1

## 2017-06-26 MED ORDER — SODIUM CHLORIDE 0.9% FLUSH
3.0000 mL | Freq: Two times a day (BID) | INTRAVENOUS | Status: DC
  Administered 2017-06-27 – 2017-06-29 (×3): 3 mL via INTRAVENOUS

## 2017-06-26 MED ORDER — SODIUM CHLORIDE 0.9 % WEIGHT BASED INFUSION: 3.0000 mL/kg/h | INTRAVENOUS | Status: DC

## 2017-06-26 MED ORDER — SODIUM CHLORIDE 0.9% FLUSH: 3.0000 mL | INTRAVENOUS | Status: DC | PRN

## 2017-06-26 MED ORDER — VERAPAMIL HCL 2.5 MG/ML IV SOLN
INTRAVENOUS | Status: AC
  Filled 2017-06-26: qty 2

## 2017-06-26 MED ORDER — HEPARIN (PORCINE) IN NACL 2-0.9 UNIT/ML-% IJ SOLN
INTRAMUSCULAR | Status: DC | PRN
  Administered 2017-06-26: 10 mL via INTRA_ARTERIAL

## 2017-06-26 MED ORDER — LIDOCAINE HCL (PF) 1 % IJ SOLN
INTRAMUSCULAR | Status: AC
  Filled 2017-06-26: qty 30

## 2017-06-26 MED ORDER — SODIUM CHLORIDE 0.9 % IV SOLN
INTRAVENOUS | Status: AC
  Administered 2017-06-26: 16:00:00 via INTRAVENOUS

## 2017-06-26 MED ORDER — SODIUM CHLORIDE 0.9 % WEIGHT BASED INFUSION
1.0000 mL/kg/h | INTRAVENOUS | Status: DC
  Administered 2017-06-26: 1 mL/kg/h via INTRAVENOUS

## 2017-06-26 MED ORDER — SODIUM CHLORIDE 0.9 % IV SOLN: INTRAVENOUS | Status: AC

## 2017-06-26 MED ORDER — IOPAMIDOL (ISOVUE-370) INJECTION 76%
INTRAVENOUS | Status: AC
  Filled 2017-06-26: qty 50

## 2017-06-26 MED ORDER — IOPAMIDOL (ISOVUE-370) INJECTION 76%
INTRAVENOUS | Status: DC | PRN
  Administered 2017-06-26: 105 mL via INTRA_ARTERIAL

## 2017-06-26 MED ORDER — SODIUM CHLORIDE 0.9 % IV SOLN
250.0000 mL | INTRAVENOUS | Status: DC | PRN
  Administered 2017-06-26: 250 mL via INTRAVENOUS

## 2017-06-26 MED ORDER — HYDROMORPHONE HCL 1 MG/ML IJ SOLN: 1.0000 mg | Freq: Once | INTRAMUSCULAR | Status: DC

## 2017-06-26 MED ORDER — HEPARIN SODIUM (PORCINE) 1000 UNIT/ML IJ SOLN
INTRAMUSCULAR | Status: DC | PRN
  Administered 2017-06-26: 5000 [IU] via INTRAVENOUS

## 2017-06-26 MED ORDER — HEPARIN (PORCINE) IN NACL 2-0.9 UNIT/ML-% IJ SOLN
INTRAMUSCULAR | Status: AC | PRN
  Administered 2017-06-26: 1000 mL

## 2017-06-26 MED ORDER — ASPIRIN 81 MG PO CHEW
81.0000 mg | CHEWABLE_TABLET | ORAL | Status: DC
  Filled 2017-06-26: qty 1

## 2017-06-26 MED ORDER — SODIUM CHLORIDE 0.9 % IV SOLN: 250.0000 mL | INTRAVENOUS | Status: DC | PRN

## 2017-06-26 MED ORDER — HEPARIN (PORCINE) IN NACL 2-0.9 UNIT/ML-% IJ SOLN
INTRAMUSCULAR | Status: AC
  Filled 2017-06-26: qty 1000

## 2017-06-26 SURGICAL SUPPLY — 15 items
CATH BALLN WEDGE 5F 110CM (CATHETERS) ×2 IMPLANT
CATH INFINITI 5FR ANG PIGTAIL (CATHETERS) ×2 IMPLANT
CATH OPTITORQUE TIG 4.0 5F (CATHETERS) ×2 IMPLANT
COVER PRB 48X5XTLSCP FOLD TPE (BAG) ×1 IMPLANT
COVER PROBE 5X48 (BAG) ×1
DEVICE RAD COMP TR BAND LRG (VASCULAR PRODUCTS) ×2 IMPLANT
GLIDESHEATH SLEND A-KIT 6F 22G (SHEATH) ×2 IMPLANT
GUIDEWIRE INQWIRE 1.5J.035X260 (WIRE) ×1 IMPLANT
INQWIRE 1.5J .035X260CM (WIRE) ×2
KIT HEART LEFT (KITS) ×2 IMPLANT
PACK CARDIAC CATHETERIZATION (CUSTOM PROCEDURE TRAY) ×2 IMPLANT
SHEATH GLIDE SLENDER 4/5FR (SHEATH) ×2 IMPLANT
SYR MEDRAD MARK V 150ML (SYRINGE) ×2 IMPLANT
TRANSDUCER W/STOPCOCK (MISCELLANEOUS) ×2 IMPLANT
TUBING CIL FLEX 10 FLL-RA (TUBING) ×2 IMPLANT

## 2017-06-26 NOTE — Progress Notes (Signed)
PROGRESS NOTE  Jill [name] ZOX:[id number] DOB: 09-09-1961 DOA: 06/18/2017 PCP: [facility], [facility]  HPI/Recap of past 24 hours:   patient is seen prior to cardiac cath, she denies chest pain, no sob, no cough, lower extremity edema is improving husband at bed side    Assessment/Plan: Active Problems:   Essential hypertension   Hyperkalemia   Type 2 diabetes mellitus, uncontrolled, with neuropathy (HCC)   Hyperlipidemia   Chest pain   Elevated troponin   CKD (chronic kidney disease) stage 3, GFR 30-59 ml/min   Pressure injury of skin   Cardiomyopathy (HCC)   Mitral regurgitation   Atrial flutter (HCC)   Hyperkalemia  -she was seen at the health department for diabetic foot ulcers, routine work up showed hyperkalemia, she is referred to [facility].  -Likely multifactorial including medications (lisinopril, ibuprofen), AKI, exogenous intake from food,  RTA type 4 and relative insulin deficiency -she received kayexalate/calciun gluconate/bicarb/albuterol ---lisinopril /ibuprofen discontinued  - K is improved -consult dietician to help with education for avoiding K rich foods -am BMP  Chest pain, Cardiomyopathy--newly diagnosed/pulmonary hypertension --06/19/17 Echo--EF 45-at the percent, grade 2 DD, moderate to severe MR, PASP 62 -may be due to mitral regurg vs ischemic -appreciate cardiology eval-->TEE on 9/6, and cardiac cath on 9/7 -she is currently on asa/statin/betablocker -will follow cardiology recommendation   acute on chronic chf vs fluids overload?  -there is documented brief hypoxia initially on presentation, currently on room air, no hypoxia - cxr on 9/1 concerns for mild chf "with acute moderate diffuse interstitial pulmonary edema which is new since 2 days ago" -she does has bilateral lower extremity pitting edema (R>L) on exam today, she reports this is new started a few days ago -Venous doppler negative for DVT on right lower  extremity -prn lasix, close monitor volume status, cardiology following   AKI on CKD stage II --Discontinue lisinopril, ibuprofen  -CPK 98 -Renal ultrasound--neg for hydronephrosis -Cr peaked at 1.47, cr normalized around  0.8 since 9/1, ua was not done on admission  Hyperlipidemia - fasting lipid ldl 58, hdl 33 -Continue statin  Insulin dependent iabetes mellitus type 2 with neuropathy -she has been on insulin , GLP1 agonists injection and  Oral meds with dpp4inhibitor/metformin -Hemoglobin A1c--10.4 -Continue gabapentin -she has been on insulin here, diabetes education -expect resume home meds at discharge. -continue to adjust insulin dose   Diabetic foot ulcers -Do not appear to be infected on clinical exam -wound care consult appreciate -d/c cephlexin    Code Status: full  Family Communication: patient and family at bedside  Disposition Plan: not ready for discharge, need cardiology clearance prior to discharge. may need home  Health at discharge   Consultants:  cardiology  Procedures:  TEE on 9/6    cardiac cath on 9/7  Antibiotics:  none   Objective: BP (!) 116/56 (BP Location: Right Arm)   Pulse 75   Temp 97.6 F (36.4 C) (Oral)   Resp 20   Ht  (1.6 m)   Wt 102.3 kg (225 lb 9.6 oz) Comment: scale a  SpO2 96%   BMI 39.96 kg/m   Intake/Output Summary (Last 24 hours) at 06/26/17 0743 Last data filed at 06/26/17 0501  Gross per 24 hour  Intake              240 ml  Output             1075 ml  Net             -  835 ml   Filed Weights   06/24/17 0334 06/25/17 0653 06/26/17 0450  Weight: 102.5 kg (226 lb) 102.8 kg (226 lb 11.2 oz) 102.3 kg (225 lb 9.6 oz)    Exam: Patient is examined daily including today on 06/26/2017, exams remain the same as of yesterday except that has changed    General:  NAD  Cardiovascular: RRR  Respiratory: diminished at basis with mild bibasilar crackles, no wheezing, no rhonchi  Abdomen:  Soft/ND/NT, positive BS  Musculoskeletal: bilateral legt pitting Edema, right > left, chronic right foot ulcer ( she reports stepped on a stone)  Neuro: alert, oriented   Data Reviewed: Basic Metabolic Panel:  Recent Labs Lab 06/20/17 0436 06/21/17 0554 06/22/17 0538 06/23/17 0421 06/25/17 0532 06/26/17 0417  NA 143 142 143 142 142 141  K 4.9 4.8 4.6 5.1 5.0 5.0  CL 116* 113* 113* 112* 113* 109  CO2 GLUCOSE 88 228* 99 133* 158* 191*  BUN 34* 26* 25* 28* 29* 29*  CREATININE 0.80 0.92 0.83 0.81 0.85 0.94  CALCIUM 8.2* 7.9* 8.2* 8.0* 8.2* 8.2*  MG 2.1  --   --   --   --   --    Liver Function Tests:  Recent Labs Lab 06/25/17 0532  AST 34  ALT 38  ALKPHOS 75  BILITOT 0.6  PROT 6.0*  ALBUMIN 2.7*   No results for input(s): LIPASE, AMYLASE in the last 168 hours. No results for input(s): AMMONIA in the last 168 hours. CBC:  Recent Labs Lab 06/26/17 0417  WBC 8.3  HGB 8.5*  HCT 27.1*  MCV 84.4  PLT 273   Cardiac Enzymes:    Recent Labs Lab 06/19/17 0811  TROPONINI 0.25*   BNP (last 3 results) No results for input(s): BNP in the last 8760 hours.  ProBNP (last 3 results) No results for input(s): PROBNP in the last 8760 hours.  CBG:  Recent Labs Lab 06/24/17 2032 06/25/17 0747 06/25/17 1142 06/25/17 1649 06/25/17 2123  GLUCAP 254* 131* 105* 79 302*    No results found for this or any previous visit (from the past 240 hour(s)).   Studies: No results found.  Scheduled Meds: . aspirin  81 mg Oral Pre-Cath  . aspirin EC  81 mg Oral Daily  . collagenase   Topical Daily  . enoxaparin (LOVENOX) injection  50 mg Subcutaneous Q24H  . gabapentin  400 mg Oral TID  . insulin aspart  0-15 Units Subcutaneous TID WC  . insulin aspart  0-5 Units Subcutaneous QHS  . insulin aspart  4 Units Subcutaneous TID WC  . insulin detemir  15 Units Subcutaneous BID  . living well with diabetes book- in spanish   Does not apply Once  .  loratadine  10 mg Oral Daily  . metoprolol succinate  12.5 mg Oral Daily  . rosuvastatin  20 mg Oral Daily  . senna-docusate  1 tablet Oral BID  . sodium chloride flush  3 mL Intravenous Q12H    Continuous Infusions: . sodium chloride 250 mL (06/26/17 0603)  . sodium chloride       Time spent: I have personally reviewed and interpreted on  06/26/2017 daily labs, tele strips, imagings as discussed above under data review session and assessment and plans.  I reviewed all nursing notes, pharmacy notes, consultant notes,  vitals, pertinent old records  I have discussed plan of care as described above with RN , patient and family  on 06/26/2017   Jarred Purtee MD, PhD  [facility] Pager [phone]. If 7PM-7AM, please contact night-coverage at [email or web address], password [email or web address] 06/26/2017, 7:43 AM  LOS: 2 days

## 2017-06-26 NOTE — Progress Notes (Signed)
Progress Note  Patient Name: Jill [name] Date of Encounter: 06/26/2017  Primary Cardiologist:  Dr Diona Browner  Subjective   No chest pain or dyspnea   Inpatient Medications    Scheduled Meds: . aspirin  81 mg Oral Pre-Cath  . aspirin EC  81 mg Oral Daily  . collagenase   Topical Daily  . enoxaparin (LOVENOX) injection  50 mg Subcutaneous Q24H  . gabapentin  400 mg Oral TID  . insulin aspart  0-15 Units Subcutaneous TID WC  . insulin aspart  0-5 Units Subcutaneous QHS  . insulin aspart  4 Units Subcutaneous TID WC  . insulin detemir  15 Units Subcutaneous BID  . living well with diabetes book- in spanish   Does not apply Once  . loratadine  10 mg Oral Daily  . metoprolol succinate  12.5 mg Oral Daily  . rosuvastatin  20 mg Oral Daily  . senna-docusate  1 tablet Oral BID  . sodium chloride flush  3 mL Intravenous Q12H   Continuous Infusions: . sodium chloride 250 mL (06/26/17 0603)  . sodium chloride 1 mL/kg/hr (06/26/17 0700)   PRN Meds: sodium chloride, acetaminophen **OR** acetaminophen, ondansetron **OR** ondansetron (ZOFRAN) IV, sodium chloride flush   Vital Signs    Vitals:   06/25/17 1650 06/25/17 2125 06/26/17 0041 06/26/17 0450  BP: 124/65 133/65 116/62 (!) 116/56  Pulse: 79 79 86 75  Resp:  20 20 20   Temp:  98.5 F (36.9 C) 98.1 F (36.7 C) 97.6 F (36.4 C)  TempSrc:  Oral Oral Oral  SpO2: 100% 95% 98% 96%  Weight:    225 lb 9.6 oz (102.3 kg)  Height:        Intake/Output Summary (Last 24 hours) at 06/26/17 0812 Last data filed at 06/26/17 0501  Gross per 24 hour  Intake              240 ml  Output             1075 ml  Net             -835 ml   Filed Weights   06/24/17 0334 06/25/17 0653 06/26/17 0450  Weight: 226 lb (102.5 kg) 226 lb 11.2 oz (102.8 kg) 225 lb 9.6 oz (102.3 kg)    Telemetry    NSR in the 70's - Personally Reviewed  ECG    No new tracings - Personally Reviewed  Physical Exam   GEN: No acute distress.   Neck: No  JVD Cardiac: RRR, Soft 2/6 systolic murmur in left lateral chest Respiratory: Clear to auscultation bilaterally except for few crackles in posterior bases. GI: Soft, nontender, non-distended  MS: 1+ lower extremity edema; Right foot wrapped. Neuro:  Nonfocal  Psych: Normal affect   Labs    Chemistry  Recent Labs Lab 06/23/17 0421 06/25/17 0532 06/26/17 0417  NA 142 142 141  K 5.1 5.0 5.0  CL 112* 113* 109  CO2 26 25 26   GLUCOSE 133* 158* 191*  BUN 28* 29* 29*  CREATININE 0.81 0.85 0.94  CALCIUM 8.0* 8.2* 8.2*  PROT  --  6.0*  --   ALBUMIN  --  2.7*  --   AST  --  34  --   ALT  --  38  --   ALKPHOS  --  75  --   BILITOT  --  0.6  --   GFRNONAA >60 >60 >60  GFRAA >60 >60 >60  ANIONGAP 4* 4* 6  Hematology  Recent Labs Lab 06/26/17 0417  WBC 8.3  RBC 3.21*  HGB 8.5*  HCT 27.1*  MCV 84.4  MCH 26.5  MCHC 31.4  RDW 15.2  PLT 273    Cardiac Enzymes No results for input(s): TROPONINI in the last 168 hours. No results for input(s): TROPIPOC in the last 168 hours.   BNPNo results for input(s): BNP, PROBNP in the last 168 hours.   DDimer No results for input(s): DDIMER in the last 168 hours.   Radiology    No results found.  Cardiac Studies   Echocardiogram 06/19/2017 Left ventricle: The cavity size was normal. Wall thickness was at the upper limits of normal. Systolic function was mildly reduced. The estimated ejection fraction was in the range of 45% to 50%. Diffuse hypokinesis. Features are consistent with a pseudonormal left ventricular filling pattern, with concomitant abnormal relaxation and increased filling pressure (grade 2 diastolic dysfunction). - Mitral valve: There was moderate to severe regurgitation. - Left atrium: The atrium was moderately dilated. - Right atrium: Central venous pressure (est): 3 mm Hg. - Atrial septum: No defect or patent foramen ovale was identified. - Tricuspid valve: There was mild regurgitation. -  Pulmonary arteries: Systolic pressure was severely increased. PA peak pressure: 62 mm Hg (S). - Pericardium, extracardiac: There was no pericardial effusion.   Patient Profile     56 y.o. female with a history of Type 2 diabetes mellitus with peripheral neuropathy, hypertension, and hyperlipidemia admitted for hyperkalemia with K+ 7.5 who is being seen for the evaluation of exertional shortness of breath and chest discomfort. 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 Mechanism of her mitral regurgitation was not clear, no obvious leaflet incompetence or substantial annular dilatation was found. Recommended for TEE sometime this week if stable.  Assessment & Plan    Chest pain -Pt was admitted to [facility] with hyperkalemia and admitted to chest burning and some dyspnea on exertion. She was stabilized with her electrolytes and transferred to [facility] for further cardiac workup -Troponins minimally elevated in flat trend: 0.30, 0.26, 0.25.  BNP  175.0 -CVD risk factors include diabetes, hypertension, hyperlipidemia -Cath planned for today Would do steep RAO ventriculography to look at MR  Mitral regurgitation -moderate by TEE yesterday will see what right heart pressures are and V -gram   Electrolyte abnormality -Potassium level was 7.5 on admission -Now resolved  Diabetes type 2 on insulin -Hemoglobin A1c 10.4 on 06/18/17, was previously 12.6 -Patient will need improved glucose control to reduce her cardiac risk  -Inpatient insulin and sliding scale per internal medicine  Hypertension -Home management includes lisinopril 5 mg and hydrochlorothiazide 25 mg -Current treatment includes metoprolol succinate 12.5 mg daily. Blood pressure is well controlled.  Hyperlipidemia -We'll check lipid panel for  risk stratification -Continue Crestor 20 mg  Signed, Charlton Haws, MD  06/26/2017, 8:12 AM    Patient examined chart reviewed MR murmur on exam Obese hispanic female. Lungs with basilar crackles MIld elevation in troponin with no trend For TEE today to assess MR and then right and left cath in am to r/o CAD and assess right heart pressures  Charlton Haws

## 2017-06-26 NOTE — Interval H&P Note (Signed)
History and Physical Interval Note:  06/26/2017 9:02 AM  Jill [name]  has presented today for surgery, with the diagnosis of cp, moderate to severe mitrial regurgetation  The various methods of treatment have been discussed with the patient and family. After consideration of risks, benefits and other options for treatment, the patient has consented to  Procedure(s): RIGHT/LEFT HEART CATH AND CORONARY ANGIOGRAPHY (N/A) with possible PERCUTANEOUS CORONARY INTERVENTION as a surgical intervention .  The patient's history has been reviewed, patient examined, no change in status, stable for surgery.  I have reviewed the patient's chart and labs.  Questions were answered to the patient's satisfaction.    Cath Lab Visit (complete for each Cath Lab visit)  Clinical Evaluation Leading to the Procedure:   ACS: Yes.    Non-ACS:    Anginal Classification: CCS III  Anti-ischemic medical therapy: Minimal Therapy (1 class of medications)  Non-Invasive Test Results: No non-invasive testing performed; REDUCED EF BY ECHO   Prior CABG: No previous CABG  Bryan Lemma

## 2017-06-26 NOTE — Progress Notes (Signed)
ANTICOAGULATION CONSULT NOTE - Initial Consult  Pharmacy Consult for Lovenox Indication: VTE prophylaxis  No Known Allergies  Patient Measurements: Height: 5\' 3"  (160 cm) Weight: 225 lb 9.6 oz (102.3 kg) (scale a) IBW/kg (Calculated) : 52.4   Vital Signs: Temp: 98 F (36.7 C) (09/07 1200) Temp Source: Oral (09/07 1200) BP: 142/59 (09/07 1500) Pulse Rate: 81 (09/07 1500)  Labs:  Recent Labs  06/25/17 0532 06/26/17 0417  HGB  --  8.5*  HCT  --  27.1*  PLT  --  273  LABPROT  --  13.1  INR  --  1.00  CREATININE 0.85 0.94    Estimated Creatinine Clearance: 76.4 mL/min (by C-G formula based on SCr of 0.94 mg/dL).   Medical History: Past Medical History:  Diagnosis Date  . Diabetic neuropathy (HCC)   . Hyperlipidemia   . Hypertension   . Type 2 diabetes mellitus (HCC)     Medications:  Scheduled:  . aspirin  81 mg Oral Pre-Cath  . [MAR Hold] aspirin EC  81 mg Oral Daily  . [MAR Hold] collagenase   Topical Daily  . enoxaparin (LOVENOX) injection  50 mg Subcutaneous Q24H  . [MAR Hold] gabapentin  400 mg Oral TID  . [MAR Hold] insulin aspart  0-15 Units Subcutaneous TID WC  . [MAR Hold] insulin aspart  0-5 Units Subcutaneous QHS  . [MAR Hold] insulin aspart  4 Units Subcutaneous TID WC  . [MAR Hold] insulin detemir  15 Units Subcutaneous BID  . [MAR Hold] living well with diabetes book- in spanish   Does not apply Once  . [MAR Hold] loratadine  10 mg Oral Daily  . [MAR Hold] metoprolol succinate  12.5 mg Oral Daily  . [MAR Hold] rosuvastatin  20 mg Oral Daily  . [MAR Hold] senna-docusate  1 tablet Oral BID  . sodium chloride flush  3 mL Intravenous Q12H    Assessment: 56 yo female s/p cath.  Pharmacy asked to help dose lovenox for DVT prophylaxis.  To be started 8 hrs after sheath removed.  Pulled at 3 PM.  Morbidly obese.  Goal of Therapy:  VTE prophylaxis Monitor platelets by anticoagulation protocol: Yes   Plan:  1. Resume Lovenox 50 mg sq q 24 hrs  tonight at 11pm. 2. Pharmacy will sign off, please contact if questions.  Thanks!  Tad Moore, BCPS  Clinical Pharmacist Pager [phone]  06/26/2017 3:22 PM

## 2017-06-26 NOTE — H&P (View-Only) (Signed)
Progress Note  Patient Name: Jill [name] Date of Encounter: 06/26/2017  Primary Cardiologist:  Dr Diona Browner  Subjective   No chest pain or dyspnea   Inpatient Medications    Scheduled Meds: . aspirin  81 mg Oral Pre-Cath  . aspirin EC  81 mg Oral Daily  . collagenase   Topical Daily  . enoxaparin (LOVENOX) injection  50 mg Subcutaneous Q24H  . gabapentin  400 mg Oral TID  . insulin aspart  0-15 Units Subcutaneous TID WC  . insulin aspart  0-5 Units Subcutaneous QHS  . insulin aspart  4 Units Subcutaneous TID WC  . insulin detemir  15 Units Subcutaneous BID  . living well with diabetes book- in spanish   Does not apply Once  . loratadine  10 mg Oral Daily  . metoprolol succinate  12.5 mg Oral Daily  . rosuvastatin  20 mg Oral Daily  . senna-docusate  1 tablet Oral BID  . sodium chloride flush  3 mL Intravenous Q12H   Continuous Infusions: . sodium chloride 250 mL (06/26/17 0603)  . sodium chloride 1 mL/kg/hr (06/26/17 0700)   PRN Meds: sodium chloride, acetaminophen **OR** acetaminophen, ondansetron **OR** ondansetron (ZOFRAN) IV, sodium chloride flush   Vital Signs    Vitals:   06/25/17 1650 06/25/17 2125 06/26/17 0041 06/26/17 0450  BP: 124/65 133/65 116/62 (!) 116/56  Pulse: 79 79 86 75  Resp:  20 20 20   Temp:  98.5 F (36.9 C) 98.1 F (36.7 C) 97.6 F (36.4 C)  TempSrc:  Oral Oral Oral  SpO2: 100% 95% 98% 96%  Weight:    225 lb 9.6 oz (102.3 kg)  Height:        Intake/Output Summary (Last 24 hours) at 06/26/17 0812 Last data filed at 06/26/17 0501  Gross per 24 hour  Intake              240 ml  Output             1075 ml  Net             -835 ml   Filed Weights   06/24/17 0334 06/25/17 0653 06/26/17 0450  Weight: 226 lb (102.5 kg) 226 lb 11.2 oz (102.8 kg) 225 lb 9.6 oz (102.3 kg)    Telemetry    NSR in the 70's - Personally Reviewed  ECG    No new tracings - Personally Reviewed  Physical Exam   GEN: No acute distress.   Neck: No  JVD Cardiac: RRR, Soft 2/6 systolic murmur in left lateral chest Respiratory: Clear to auscultation bilaterally except for few crackles in posterior bases. GI: Soft, nontender, non-distended  MS: 1+ lower extremity edema; Right foot wrapped. Neuro:  Nonfocal  Psych: Normal affect   Labs    Chemistry  Recent Labs Lab 06/23/17 0421 06/25/17 0532 06/26/17 0417  NA 142 142 141  K 5.1 5.0 5.0  CL 112* 113* 109  CO2 26 25 26   GLUCOSE 133* 158* 191*  BUN 28* 29* 29*  CREATININE 0.81 0.85 0.94  CALCIUM 8.0* 8.2* 8.2*  PROT  --  6.0*  --   ALBUMIN  --  2.7*  --   AST  --  34  --   ALT  --  38  --   ALKPHOS  --  75  --   BILITOT  --  0.6  --   GFRNONAA >60 >60 >60  GFRAA >60 >60 >60  ANIONGAP 4* 4* 6  Hematology  Recent Labs Lab 06/26/17 0417  WBC 8.3  RBC 3.21*  HGB 8.5*  HCT 27.1*  MCV 84.4  MCH 26.5  MCHC 31.4  RDW 15.2  PLT 273    Cardiac Enzymes No results for input(s): TROPONINI in the last 168 hours. No results for input(s): TROPIPOC in the last 168 hours.   BNPNo results for input(s): BNP, PROBNP in the last 168 hours.   DDimer No results for input(s): DDIMER in the last 168 hours.   Radiology    No results found.  Cardiac Studies   Echocardiogram 06/19/2017 Left ventricle: The cavity size was normal. Wall thickness was at the upper limits of normal. Systolic function was mildly reduced. The estimated ejection fraction was in the range of 45% to 50%. Diffuse hypokinesis. Features are consistent with a pseudonormal left ventricular filling pattern, with concomitant abnormal relaxation and increased filling pressure (grade 2 diastolic dysfunction). - Mitral valve: There was moderate to severe regurgitation. - Left atrium: The atrium was moderately dilated. - Right atrium: Central venous pressure (est): 3 mm Hg. - Atrial septum: No defect or patent foramen ovale was identified. - Tricuspid valve: There was mild regurgitation. -  Pulmonary arteries: Systolic pressure was severely increased. PA peak pressure: 62 mm Hg (S). - Pericardium, extracardiac: There was no pericardial effusion.   Patient Profile     56 y.o. female with a history of Type 2 diabetes mellitus with peripheral neuropathy, hypertension, and hyperlipidemia admitted for hyperkalemia with K+ 7.5 who is being seen for the evaluation of exertional shortness of breath and chest discomfort. 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 Mechanism of her mitral regurgitation was not clear, no obvious leaflet incompetence or substantial annular dilatation was found. Recommended for TEE sometime this week if stable.  Assessment & Plan    Chest pain -Pt was admitted to [facility] with hyperkalemia and admitted to chest burning and some dyspnea on exertion. She was stabilized with her electrolytes and transferred to [facility] for further cardiac workup -Troponins minimally elevated in flat trend: 0.30, 0.26, 0.25.  BNP  175.0 -CVD risk factors include diabetes, hypertension, hyperlipidemia -Cath planned for today Would do steep RAO ventriculography to look at MR  Mitral regurgitation -moderate by TEE yesterday will see what right heart pressures are and V -gram   Electrolyte abnormality -Potassium level was 7.5 on admission -Now resolved  Diabetes type 2 on insulin -Hemoglobin A1c 10.4 on 06/18/17, was previously 12.6 -Patient will need improved glucose control to reduce her cardiac risk  -Inpatient insulin and sliding scale per internal medicine  Hypertension -Home management includes lisinopril 5 mg and hydrochlorothiazide 25 mg -Current treatment includes metoprolol succinate 12.5 mg daily. Blood pressure is well controlled.  Hyperlipidemia -We'll check lipid panel for  risk stratification -Continue Crestor 20 mg  Signed, Jermy Couper, MD  06/26/2017, 8:12 AM    Patient examined chart reviewed MR murmur on exam Obese hispanic female. Lungs with basilar crackles MIld elevation in troponin with no trend For TEE today to assess MR and then right and left cath in am to r/o CAD and assess right heart pressures  Payton Prinsen  

## 2017-06-27 DIAGNOSIS — I25110 Atherosclerotic heart disease of native coronary artery with unstable angina pectoris: Secondary | ICD-10-CM

## 2017-06-27 DIAGNOSIS — E785 Hyperlipidemia, unspecified: Secondary | ICD-10-CM

## 2017-06-27 DIAGNOSIS — I208 Other forms of angina pectoris: Secondary | ICD-10-CM

## 2017-06-27 DIAGNOSIS — I340 Nonrheumatic mitral (valve) insufficiency: Secondary | ICD-10-CM

## 2017-06-27 DIAGNOSIS — I5021 Acute systolic (congestive) heart failure: Secondary | ICD-10-CM

## 2017-06-27 LAB — GLUCOSE, CAPILLARY
GLUCOSE-CAPILLARY: 117 mg/dL — AB (ref 65–99)
GLUCOSE-CAPILLARY: 183 mg/dL — AB (ref 65–99)
Glucose-Capillary: 220 mg/dL — ABNORMAL HIGH (ref 65–99)
Glucose-Capillary: 205 mg/dL — ABNORMAL HIGH (ref 65–99)

## 2017-06-27 LAB — BASIC METABOLIC PANEL
Anion gap: 7 (ref 5–15)
BUN: 30 mg/dL — AB (ref 6–20)
CALCIUM: 8.1 mg/dL — AB (ref 8.9–10.3)
CO2: 22 mmol/L (ref 22–32)
Chloride: 110 mmol/L (ref 101–111)
Creatinine, Ser: 1.02 mg/dL — ABNORMAL HIGH (ref 0.44–1.00)
GFR calc Af Amer: >60 mL/min (ref >60)
GLUCOSE: 207 mg/dL — AB (ref 65–99)
Potassium: 5.2 mmol/L — ABNORMAL HIGH (ref 3.5–5.1)
Sodium: 139 mmol/L (ref 135–145)

## 2017-06-27 LAB — CBC
HCT: 27.8 % — ABNORMAL LOW (ref 36.0–46.0)
Hemoglobin: 8.3 g/dL — ABNORMAL LOW (ref 12.0–15.0)
MCH: 25.2 pg — AB (ref 26.0–34.0)
MCHC: 29.9 g/dL — AB (ref 30.0–36.0)
MCV: 84.5 fL (ref 78.0–100.0)
PLATELETS: 255 10*3/uL (ref 150–400)
RBC: 3.29 MIL/uL — ABNORMAL LOW (ref 3.87–5.11)
RDW: 15.4 % (ref 11.5–15.5)
WBC: 7.7 10*3/uL (ref 4.0–10.5)

## 2017-06-27 MED ORDER — FUROSEMIDE 40 MG PO TABS
40.0000 mg | ORAL_TABLET | Freq: Every day | ORAL | Status: DC
  Administered 2017-06-27 – 2017-06-29 (×3): 40 mg via ORAL
  Filled 2017-06-27 (×3): qty 1

## 2017-06-27 NOTE — Progress Notes (Signed)
Progress Note  Patient Name: Jill [name] Date of Encounter: 06/27/2017  Primary Cardiologist: Dr. Diona Browner  Subjective   Denies chest pain. Says easier to sleep lying flat. No leg swelling. Says her son is coming who speaks English well and can explain medical issues to both parents.  Inpatient Medications    Scheduled Meds: . aspirin EC  81 mg Oral Daily  . collagenase   Topical Daily  . enoxaparin (LOVENOX) injection  50 mg Subcutaneous Q24H  . furosemide  40 mg Oral Daily  . gabapentin  400 mg Oral TID  . insulin aspart  0-15 Units Subcutaneous TID WC  . insulin aspart  0-5 Units Subcutaneous QHS  . insulin aspart  4 Units Subcutaneous TID WC  . insulin detemir  20 Units Subcutaneous BID  . living well with diabetes book- in spanish   Does not apply Once  . loratadine  10 mg Oral Daily  . metoprolol succinate  12.5 mg Oral Daily  . rosuvastatin  20 mg Oral Daily  . senna-docusate  1 tablet Oral BID  . sodium chloride flush  3 mL Intravenous Q12H  . sodium chloride flush  3 mL Intravenous Q12H   Continuous Infusions: . sodium chloride    . sodium chloride     PRN Meds: sodium chloride, sodium chloride, acetaminophen **OR** acetaminophen, ondansetron **OR** ondansetron (ZOFRAN) IV, sodium chloride flush, sodium chloride flush   Vital Signs    Vitals:   06/26/17 1500 06/26/17 1939 06/27/17 0016 06/27/17 0557  BP: (!) 142/59 (!) 124/59 129/75 112/64  Pulse: 81 76 80 81  Resp: (!) 21 18  18   Temp:  97.8 F (36.6 C)  98.4 F (36.9 C)  TempSrc:  Oral  Oral  SpO2: 93% 96% 98% 95%  Weight:    226 lb 1.6 oz (102.6 kg)  Height:        Intake/Output Summary (Last 24 hours) at 06/27/17 0949 Last data filed at 06/26/17 2300  Gross per 24 hour  Intake              240 ml  Output              400 ml  Net             -160 ml   Filed Weights   06/25/17 0653 06/26/17 0450 06/27/17 0557  Weight: 226 lb 11.2 oz (102.8 kg) 225 lb 9.6 oz (102.3 kg) 226 lb 1.6 oz (102.6  kg)    Telemetry    NSR - Personally Reviewed  ECG    n/a - Personally Reviewed  Physical Exam   GEN: No acute distress.   Neck: No JVD Cardiac: RRR, no murmurs, rubs, or gallops.  Respiratory: Diffuse crackles b/l GI: Soft, nontender, non-distended  MS: No edema; No deformity. Neuro:  Nonfocal  Psych: Normal affect   Labs    Chemistry Recent Labs Lab 06/25/17 0532 06/26/17 0417 06/27/17 0401  NA 142 141 139  K 5.0 5.0 5.2*  CL 113* 109 110  CO2 25 26 22   GLUCOSE 158* 191* 207*  BUN 29* 29* 30*  CREATININE 0.85 0.94 1.02*  CALCIUM 8.2* 8.2* 8.1*  PROT 6.0*  --   --   ALBUMIN 2.7*  --   --   AST 34  --   --   ALT 38  --   --   ALKPHOS 75  --   --   BILITOT 0.6  --   --  GFRNONAA >60 >60 >60  GFRAA >60 >60 >60  ANIONGAP 4* 6 7     Hematology Recent Labs Lab 06/26/17 0417 06/27/17 0401  WBC 8.3 7.7  RBC 3.21* 3.29*  HGB 8.5* 8.3*  HCT 27.1* 27.8*  MCV 84.4 84.5  MCH 26.5 25.2*  MCHC 31.4 29.9*  RDW 15.2 15.4  PLT 273 255    Cardiac EnzymesNo results for input(s): TROPONINI in the last 168 hours. No results for input(s): TROPIPOC in the last 168 hours.   BNPNo results for input(s): BNP, PROBNP in the last 168 hours.   DDimer No results for input(s): DDIMER in the last 168 hours.   Radiology    No results found.  Cardiac Studies   Cath 06/26/17:   Prox RCA lesion, 70 %stenosed. Mid RCA lesion, 40 %stenosed. Dist RCA lesion, 40 %stenosed.  Ost 2nd Diag to 2nd Diag lesion, 90 %stenosed.  Cx-OM1 bifurcation: Prox Cx lesion, 80 %stenosed. Mid Cx lesion, 100 %stenosed. Ost 1st Mrg lesion, 70 %stenosed.  There is moderate left ventricular systolic dysfunction. The left ventricular ejection fraction is 35-45% by visual estimate.  LV end diastolic pressure is severely elevated.  There is moderate (3+) mitral regurgitation by LV Gram, but Lage V wave on PCWP would suggest Severe MR.  There is no aortic valve stenosis.  Hemodynamic  findings consistent with severe pulmonary hypertension. Mostly Secondary to MR & elevated LVEDP.   Severe 2 Vessel disease (moderate to severe RCA and one or percent mid circumflex-OM occlusion) along with severe diffusely diseased very small caliber Diag branches and mild diffuse disease throughout the LAD.  Most likely at least moderate-severe mitral regurgitation based on large V wave.  Recommend CV surgery consultation to determine the best course of action would treating the patient's mitral valve and CAD.  TEE 06/26/17:  - Left ventricle: Systolic function was mildly reduced. The   estimated ejection fraction was in the range of 45% to 50%. Mild   diffuse hypokinesis with no identifiable regional variations. - Aortic valve: No evidence of vegetation. - Mitral valve: Thickening, consistent with rheumatic disease. No   evidence of vegetation. There was moderate regurgitation directed   centrally, eccentrically, and anteriorly. - Left atrium: The atrium was moderately dilated. No evidence of   thrombus in the atrial cavity or appendage. There was spontaneous   echo contrast (&quot;smoke&quot;). - Right atrium: No evidence of thrombus in the atrial cavity or   appendage. No evidence of thrombus in the atrial cavity or   appendage. - Atrial septum: No defect or patent foramen ovale was identified. - Tricuspid valve: No evidence of vegetation. There was   mild-moderate regurgitation directed centrally. - Pulmonic valve: No evidence of vegetation. No evidence of   vegetation. - Pulmonary arteries: Systolic pressure was moderately increased.   PA peak pressure: 59 mm Hg (S).  Impressions:  - Although the MR jet is eccentric and could be underestimated by   color Doppler, there is consistent evidence for moderate severity   based on continuity equation-derived regurgitant fraction,   PISA-derived effective regurgitant orifice area and absence of   pulmonary vein systolic flow  reversal.  Patient Profile     56 y.o. female with a history of Type 2 diabetes mellitus with peripheral neuropathy, hypertension, and hyperlipidemia admitted for hyperkalemia with K+ 7.5 who is being seen for the evaluation of exertional shortness of breath and chest discomfort. 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 Mechanism of her mitral regurgitation wasnot clear, no obvious leaflet incompetence or substantial annular dilatation was found. Recommended for TEE sometime this week if stable.  Assessment & Plan    1. CAD with angina: Symptomatically stable. On ASA, beta blocker, and statin. CT surgery to see regarding optimal revascularization strategy.  2. Moderate MR: TEE details above. However, given CAD which likely requires CABG, MV repair will be considered.  3. Hypertension: Controlled. No changes.  4. Hyperlipidemia: On statin. LDL at goal.  5. Hyperkalemia: Mild, 5.2 today. Will likely reduce with Lasix (see #6).  6. Acute systolic heart failure: Pulmonary edema by exam. Will start Lasix 40 mg daily and monitor renal function.    For questions or updates, please contact [facility] Please consult [email or web address] for contact info under Cardiology/STEMI. Daytime calls, contact the Day Call APP (6a-8a) or assigned team (Teams A-D) provider (7:30a - 5p). All other daytime calls (7:30-5p), contact the Card Master @ [phone].   Nighttime calls, contact the assigned APP (5p-8p) or MD (6:30p-8p). Overnight calls (8p-6a), contact the on call Fellow @ [phone].      Signed, Prentice Docker, MD  06/27/2017, 9:49 AM

## 2017-06-27 NOTE — Consult Note (Signed)
Reason for Consult:CAD + MR Referring Physician: [name] is an 56 y.o. female.  HPI: 56 yo female admitted with hyperkalemia  Jill [name] is a 56 yo woman with a past history of type II diabetes complicated by neuropathy and diabetic foot ulceration, hypertension and hyperlipidemia. She was seen at the [facility] on 8/29 for her diabetic foot ulcerations. A BMET was done and her potassium was > 7.5. She was told to go the ED the following morning and it was > 7.5 again on repeat. She was admitted.   During her evaluation she was told Dr. Carles Collet that she had been having "chest burning" with exertion while walking with her family. She tells me it is more of a sensation of not being able to catch her breath. Her admission ECG showed T wave inversion in V4-6, III and AVF. She had an echocardiogram which revealed an EF of 45-50% and moderate to severe MR. She was transferred to [facility]. A TEE showed moderate to severe MR. Catheterization showed 2 vessel CAD(3 vessel in my opinion). She has not had any rest or nocturnal symptoms.  Past Medical History:  Diagnosis Date  . Diabetic neuropathy ([place])   . Hyperlipidemia   . Hypertension   . Type 2 diabetes mellitus ([place])     Past Surgical History:  Procedure Laterality Date  . TEE WITHOUT CARDIOVERSION N/A 06/25/2017   Procedure: TRANSESOPHAGEAL ECHOCARDIOGRAM (TEE);  Surgeon: Sanda Klein, MD;  Location: [facility] ENDOSCOPY;  Service: Cardiovascular;  Laterality: N/A;  . TUBAL LIGATION      Family History  Problem Relation Age of Onset  . Diabetes type II Other   . Hypertension Other     Social History:  reports that she has never smoked. She has never used smokeless tobacco. She reports that she does not drink alcohol or use drugs.  Allergies: No Known Allergies  Medications:  Prior to Admission:  Prescriptions Prior to Admission  Medication Sig Dispense Refill Last Dose  . aspirin EC 81 MG tablet Take 81 mg  by mouth daily.   06/17/2017 at Unknown time  . atorvastatin (LIPITOR) 40 MG tablet Take 40 mg by mouth at bedtime.   06/17/2017 at Unknown time  . benzonatate (TESSALON) 100 MG capsule Take by mouth 3 (three) times daily as needed for cough.   unknown  . cephALEXin (KEFLEX) 500 MG capsule Take 500 mg by mouth 3 (three) times daily. For 10 days   06/17/2017 at Unknown time  . Dulaglutide (TRULICITY) 0.99 IP/3.8SN SOPN Inject 0.75 mg into the skin once a week.   Past Week at Unknown time  . ferrous sulfate 325 (65 FE) MG EC tablet Take 325 mg by mouth daily with breakfast.   06/17/2017 at Unknown time  . gabapentin (NEURONTIN) 400 MG capsule Take 400 mg by mouth 3 (three) times daily.   06/17/2017 at Unknown time  . hydrochlorothiazide (HYDRODIURIL) 25 MG tablet Take 25 mg by mouth daily.   06/17/2017 at Unknown time  . ibuprofen (ADVIL,MOTRIN) 800 MG tablet Take 800 mg by mouth 3 (three) times daily as needed for headache, mild pain or moderate pain.   06/17/2017 at Unknown time  . insulin detemir (LEVEMIR) 100 UNIT/ML injection Inject 25 Units into the skin 2 (two) times daily.    06/17/2017 at Unknown time  . lisinopril (PRINIVIL,ZESTRIL) 5 MG tablet Take 5 mg by mouth daily.   06/17/2017 at Unknown time  . loratadine (CLARITIN) 10 MG tablet Take  10 mg by mouth daily.   06/17/2017 at Unknown time  . rosuvastatin (CRESTOR) 20 MG tablet Take 20 mg by mouth daily.   06/17/2017 at Unknown time  . sitaGLIPtin-metformin (JANUMET) 50-1000 MG tablet Take 1 tablet by mouth 2 (two) times daily with a meal.   06/17/2017 at Unknown time  . vitamin C (ASCORBIC ACID) 500 MG tablet Take 500 mg by mouth daily.   06/17/2017 at Unknown time    Results for orders placed or performed during the hospital encounter of 06/18/17 (from the past 48 hour(s))  Glucose, capillary     Status: None   Collection Time: 06/25/17  4:49 PM  Result Value Ref Range   Glucose-Capillary 79 65 - 99 mg/dL  Glucose, capillary     Status: Abnormal    Collection Time: 06/25/17  9:23 PM  Result Value Ref Range   Glucose-Capillary 302 (H) 65 - 99 mg/dL   Comment 1 Notify RN    Comment 2 Document in Chart   CBC     Status: Abnormal   Collection Time: 06/26/17  4:17 AM  Result Value Ref Range   WBC 8.3 4.0 - 10.5 K/uL   RBC 3.21 (L) 3.87 - 5.11 MIL/uL   Hemoglobin 8.5 (L) 12.0 - 15.0 g/dL   HCT 27.1 (L) 36.0 - 46.0 %   MCV 84.4 78.0 - 100.0 fL   MCH 26.5 26.0 - 34.0 pg   MCHC 31.4 30.0 - 36.0 g/dL   RDW 15.2 11.5 - 15.5 %   Platelets 273 150 - 400 K/uL  Basic metabolic panel     Status: Abnormal   Collection Time: 06/26/17  4:17 AM  Result Value Ref Range   Sodium 141 135 - 145 mmol/L   Potassium 5.0 3.5 - 5.1 mmol/L   Chloride 109 101 - 111 mmol/L   CO2 26 22 - 32 mmol/L   Glucose, Bld 191 (H) 65 - 99 mg/dL   BUN 29 (H) 6 - 20 mg/dL   Creatinine, Ser 0.94 0.44 - 1.00 mg/dL   Calcium 8.2 (L) 8.9 - 10.3 mg/dL   GFR calc non Af Amer >60 >60 mL/min   GFR calc Af Amer >60 >60 mL/min    Comment: (NOTE) The eGFR has been calculated using the CKD EPI equation. This calculation has not been validated in all clinical situations. eGFR's persistently <60 mL/min signify possible Chronic Kidney Disease.    Anion gap 6 5 - 15  Protime-INR     Status: None   Collection Time: 06/26/17  4:17 AM  Result Value Ref Range   Prothrombin Time 13.1 11.4 - 15.2 seconds   INR 1.00   Glucose, capillary     Status: Abnormal   Collection Time: 06/26/17  7:48 AM  Result Value Ref Range   Glucose-Capillary 145 (H) 65 - 99 mg/dL   Comment 1 Notify RN   Glucose, capillary     Status: Abnormal   Collection Time: 06/26/17 11:58 AM  Result Value Ref Range   Glucose-Capillary 114 (H) 65 - 99 mg/dL   Comment 1 Notify RN   I-STAT 3, venous blood gas (G3P V)     Status: Abnormal   Collection Time: 06/26/17  2:27 PM  Result Value Ref Range   pH, Ven 7.309 7.250 - 7.430   pCO2, Ven 53.2 44.0 - 60.0 mmHg   pO2, Ven 29.0 (LL) 32.0 - 45.0 mmHg    Bicarbonate 26.7 20.0 - 28.0 mmol/L   TCO2 28  22 - 32 mmol/L   O2 Saturation 48.0 %   Patient temperature HIDE    Sample type VENOUS    Comment NOTIFIED PHYSICIAN   I-STAT 3, arterial blood gas (G3+)     Status: Abnormal   Collection Time: 06/26/17  2:29 PM  Result Value Ref Range   pH, Arterial 7.336 (L) 7.350 - 7.450   pCO2 arterial 47.2 32.0 - 48.0 mmHg   pO2, Arterial 62.0 (L) 83.0 - 108.0 mmHg   Bicarbonate 25.2 20.0 - 28.0 mmol/L   TCO2 27 22 - 32 mmol/L   O2 Saturation 89.0 %   Acid-base deficit 1.0 0.0 - 2.0 mmol/L   Patient temperature HIDE    Sample type ARTERIAL   I-STAT 3, venous blood gas (G3P V)     Status: Abnormal   Collection Time: 06/26/17  2:34 PM  Result Value Ref Range   pH, Ven 7.301 7.250 - 7.430   pCO2, Ven 53.6 44.0 - 60.0 mmHg   pO2, Ven 30.0 (LL) 32.0 - 45.0 mmHg   Bicarbonate 26.4 20.0 - 28.0 mmol/L   TCO2 28 22 - 32 mmol/L   O2 Saturation 50.0 %   Patient temperature HIDE    Sample type VENOUS   Glucose, capillary     Status: Abnormal   Collection Time: 06/26/17  4:12 PM  Result Value Ref Range   Glucose-Capillary 104 (H) 65 - 99 mg/dL  Glucose, capillary     Status: Abnormal   Collection Time: 06/26/17  8:49 PM  Result Value Ref Range   Glucose-Capillary 259 (H) 65 - 99 mg/dL  CBC     Status: Abnormal   Collection Time: 06/27/17  4:01 AM  Result Value Ref Range   WBC 7.7 4.0 - 10.5 K/uL   RBC 3.29 (L) 3.87 - 5.11 MIL/uL   Hemoglobin 8.3 (L) 12.0 - 15.0 g/dL   HCT 27.8 (L) 36.0 - 46.0 %   MCV 84.5 78.0 - 100.0 fL   MCH 25.2 (L) 26.0 - 34.0 pg   MCHC 29.9 (L) 30.0 - 36.0 g/dL   RDW 15.4 11.5 - 15.5 %   Platelets 255 150 - 400 K/uL  Basic metabolic panel     Status: Abnormal   Collection Time: 06/27/17  4:01 AM  Result Value Ref Range   Sodium 139 135 - 145 mmol/L   Potassium 5.2 (H) 3.5 - 5.1 mmol/L   Chloride 110 101 - 111 mmol/L   CO2 22 22 - 32 mmol/L   Glucose, Bld 207 (H) 65 - 99 mg/dL   BUN 30 (H) 6 - 20 mg/dL   Creatinine,  Ser 1.02 (H) 0.44 - 1.00 mg/dL   Calcium 8.1 (L) 8.9 - 10.3 mg/dL   GFR calc non Af Amer >60 >60 mL/min   GFR calc Af Amer >60 >60 mL/min    Comment: (NOTE) The eGFR has been calculated using the CKD EPI equation. This calculation has not been validated in all clinical situations. eGFR's persistently <60 mL/min signify possible Chronic Kidney Disease.    Anion gap 7 5 - 15  Glucose, capillary     Status: Abnormal   Collection Time: 06/27/17  7:20 AM  Result Value Ref Range   Glucose-Capillary 220 (H) 65 - 99 mg/dL   Comment 1 Notify RN    Comment 2 Document in Chart   Glucose, capillary     Status: Abnormal   Collection Time: 06/27/17 11:34 AM  Result Value Ref Range   Glucose-Capillary  205 (H) 65 - 99 mg/dL   Comment 1 Notify RN    Comment 2 Document in Chart     No results found.  Review of Systems  Constitutional: Positive for malaise/fatigue. Negative for chills and fever.  Eyes: Negative for blurred vision and double vision.  Respiratory: Positive for shortness of breath.   Cardiovascular: Positive for chest pain. Negative for claudication.  Gastrointestinal: Negative for nausea and vomiting.  Genitourinary: Negative for dysuria and urgency.  Neurological: Negative for focal weakness and loss of consciousness.       Numbness of feet  All other systems reviewed and are negative.  Blood pressure 115/61, pulse 79, temperature 98.4 F (36.9 C), temperature source Oral, resp. rate 18, height _0  (1.6 m), weight 226 lb 1.6 oz (102.6 kg), SpO2 98 %. Physical Exam  Vitals reviewed. Constitutional: She is oriented to person, place, and time. She appears well-developed and well-nourished. No distress.  obese  HENT:  Head: Normocephalic and atraumatic.  Mouth/Throat: No oropharyngeal exudate.  Poor dentition  Eyes: Conjunctivae and EOM are normal. No scleral icterus.  Neck: Neck supple. No thyromegaly present.  No carotid bruits  Cardiovascular: Normal rate and regular  rhythm.   Murmur (2/6 systolic) heard. Respiratory: Effort normal and breath sounds normal. No respiratory distress. She has no wheezes. She has no rales.  GI: Soft. She exhibits no distension. There is no tenderness.  Musculoskeletal: She exhibits no edema.  Lymphadenopathy:    She has no cervical adenopathy.  Neurological: She is alert and oriented to person, place, and time. No cranial nerve deficit.  Motor intact  Skin: Skin is warm and dry.   Echocardiogram Study Conclusions  - Left ventricle: The cavity size was normal. Wall thickness was at   the upper limits of normal. Systolic function was mildly reduced.   The estimated ejection fraction was in the range of 45% to 50%.   Diffuse hypokinesis. Features are consistent with a pseudonormal   left ventricular filling pattern, with concomitant abnormal   relaxation and increased filling pressure (grade 2 diastolic   dysfunction). - Mitral valve: There was moderate to severe regurgitation. - Left atrium: The atrium was moderately dilated. - Right atrium: Central venous pressure (est): 3 mm Hg. - Atrial septum: No defect or patent foramen ovale was identified. - Tricuspid valve: There was mild regurgitation. - Pulmonary arteries: Systolic pressure was severely increased. PA   peak pressure: 62 mm Hg (S). - Pericardium, extracardiac: There was no pericardial effusion.  Impressions:  - Upper normal LV wall thickness with LVEF approximately 45-50% and   diffuse hypokinesis. Grade 2 diastolic dysfunction with increased   filling pressures. Moderate left atrial enlargement. There is   moderate to severe mitral regurgitation with eccentric jet.   Mechanism of mitral regurgitation is not clear, would suggest TEE   for further evaluation. Mild tricuspid regurgitation with   evidence of severe pulmonary hypertension and PASP 62 mmHg.  Cardiac catheterization Conclusion     Prox RCA lesion, 70 %stenosed. Mid RCA lesion, 40  %stenosed. Dist RCA lesion, 40 %stenosed.  Ost 2nd Diag to 2nd Diag lesion, 90 %stenosed.  Cx-OM1 bifurcation: Prox Cx lesion, 80 %stenosed. Mid Cx lesion, 100 %stenosed. Ost 1st Mrg lesion, 70 %stenosed.  There is moderate left ventricular systolic dysfunction. The left ventricular ejection fraction is 35-45% by visual estimate.  LV end diastolic pressure is severely elevated.  There is moderate (3+) mitral regurgitation by LV Gram, but [place] V wave on  PCWP would suggest Severe MR.  There is no aortic valve stenosis.  Hemodynamic findings consistent with severe pulmonary hypertension. Mostly Secondary to MR & elevated LVEDP.   Severe 2 Vessel disease (moderate to severe RCA and one or percent mid circumflex-OM occlusion) along with severe diffusely diseased very small caliber Diag branches and mild diffuse disease throughout the LAD.  Most likely at least moderate-severe mitral regurgitation based on large V wave.  Recommend CV surgery consultation to determine the best course of action would treating the patient's mitral valve and CAD.  She will return to nursing unit for ongoing care.   Glenetta Hew, M.D., M.S. Interventional Cardiologist   I personally reviewed the echo and cath images and concur with the findings noted above  Assessment/Plan: 56 yo woman with multiple CRF including type II DM, hypertension and hyperlipidemia. She was admitted for hyperkalemia and told the physician she had been having chest burning and shortness of breath with exertion. Workup revealed 3 vessel CAD and moderately severe MR.  I reviewed her cath films. She has severe disease in the circumflex distribution and moderate RCA disease. She has diffuse disease in the LAD which was not felt to be hemodynamically significant. I think the LAD disease is more severe than indicated as there is significant calcification in the proximal LAD and the step off from the left main is more prominent than  normal. I think there is at least a long segment 50% narrowing there.  Options include medical therapy v CABG/ mitral repair or replacement. I think she will do better long term with CABG and mitral surgery, although I am not sure there is much survival benefit given moderate LAD and RCA disease. Medical therapy is a reasonable option if she chooses to pursue that.  I have discussed the general nature of the procedure, the need for general anesthesia, the use of cardiopulmonary bypass, and the incisions to be used with Jill [name] and her husband. We discussed the expected hospital stay, overall recovery and short and long term outcomes. I reviewed the indications, risks, benefits and alternatives with them. They understand the risks include, but are not limited to death, stroke, MI, DVT/PE, bleeding, possible need for transfusion, infections, heart block requiring a pacemaker, cardiac arrhythmias, as well as other organ system dysfunction including respiratory, renal, or GI complications.   She has full upper dentures and partial lower. She has not seen a dentist in quite some time. She will need an orthopantogram and dental evaluation prior to elective surgery.   Since she has relatively stable symptoms and presented with hyperkalemia, there is no need to keep her in the hospital prior to surgery. It can be electively scheduled on an outpatient basis.    Plan  Orthopantogram and preop dental evaluation  Jill [name] 06/27/2017, 1:36 PM

## 2017-06-27 NOTE — Progress Notes (Signed)
Patient ID: Jill [name], female   DOB: [birth year], 56 y.o.   MRN: [id number]  PROGRESS NOTE    Jill [name]  RTM:[id number] DOB: April 23, 1961 DOA: 06/18/2017  PCP: [facility]   Brief Narrative:  56 year old female with DM and neuropathy, diabetic foot ulceration, hypertension, dyslipidemia who was seen at [facility] on 8/29 for diabetic foot ulceration. Her BMP at that time showed potassium 7.5 and she went to ED for evaluation. Admission EKG shwoed T wave inversion in v4-v6, III, AVF. ECHO showed EF 45% and moderate to severe MR. She was transferred to [facility]. A TEE showed moderate to severe MR. Catheterization showed 2 vessel CAD.   Assessment & Plan:   Active Problems:    Chest pain / Hyperkalemia / Mild trop elevation  - Stabilized - Trop minimally elevated, 0.30, 0.26, 0.25 - CVD risk factors: DM, HTN and hyperlipidemia - Had cath done, showed severe disease in the circumflex and moderate RCA disease, diffuse disease in the LAD  - Per CTS, pt will probably do better with CABG long term, sx may be done electively    Right heel unstageable pressure injury - Seen by WOC    Essential hypertension - Continue metoprolol    Type 2 diabetes mellitus, uncontrolled, with neuropathy (HCC) - Continue Levemir 20 units BID and SSI - Continue gabapentin for neuropathy    Dyslipidemia associated with type 2 DM - Continue statin therapy     CKD (chronic kidney disease) stage 3, GFR 30-59 ml/min - Cr 1.02 this am, stable       DVT prophylaxis: Lovenox subQ  Code Status: full code  Family Communication: no family at the bedside  Disposition Plan: home once cleared by cardio    Consultants:   CTS  Cardio  WOC  Procedures:   ECHO  TEE  Antimicrobials:   None     Subjective: No overnight events.  Objective: Vitals:   06/27/17 0016 06/27/17 0557 06/27/17 0900 06/27/17 1245  BP: 129/75 112/64 (!) 102/47 115/61  Pulse: 80 81 82 79    Resp:  18 18 18   Temp:  98.4 F (36.9 C) 98.3 F (36.8 C) 98.4 F (36.9 C)  TempSrc:  Oral Oral Oral  SpO2: 98% 95% 95% 98%  Weight:  102.6 kg (226 lb 1.6 oz)    Height:        Intake/Output Summary (Last 24 hours) at 06/27/17 1427 Last data filed at 06/26/17 2300  Gross per 24 hour  Intake              240 ml  Output              400 ml  Net             -160 ml   Filed Weights   06/25/17 0653 06/26/17 0450 06/27/17 0557  Weight: 102.8 kg (226 lb 11.2 oz) 102.3 kg (225 lb 9.6 oz) 102.6 kg (226 lb 1.6 oz)    Examination:  General exam: Appears calm and comfortable  Respiratory system: Clear to auscultation. Respiratory effort normal. Cardiovascular system: S1 & S2 heard, RRR. 2/6 SEM Gastrointestinal system: Abdomen is nondistended, soft and nontender. No organomegaly or masses felt. Normal bowel sounds heard. Central nervous system: Alert and oriented. No focal neurological deficits. Extremities: Palpable pulses Skin: diabetic foot ulceration Psychiatry: Judgement and insight appear normal. Mood & affect appropriate.   Data Reviewed: I have personally reviewed following labs and imaging studies  CBC:  Recent Labs Lab 06/26/17 0417 06/27/17 0401  WBC 8.3 7.7  HGB 8.5* 8.3*  HCT 27.1* 27.8*  MCV 84.4 84.5  PLT 273 255   Basic Metabolic Panel:  Recent Labs Lab 06/22/17 0538 06/23/17 0421 06/25/17 0532 06/26/17 0417 06/27/17 0401  NA 143 142 142 141 139  K 4.6 5.1 5.0 5.0 5.2*  CL 113* 112* 113* 109 110  CO2 GLUCOSE 99 133* 158* 191* 207*  BUN 25* 28* 29* 29* 30*  CREATININE 0.83 0.81 0.85 0.94 1.02*  CALCIUM 8.2* 8.0* 8.2* 8.2* 8.1*   GFR: Estimated Creatinine Clearance: 70.5 mL/min (A) (by C-G formula based on SCr of 1.02 mg/dL (H)). Liver Function Tests:  Recent Labs Lab 06/25/17 0532  AST 34  ALT 38  ALKPHOS 75  BILITOT 0.6  PROT 6.0*  ALBUMIN 2.7*   No results for input(s): LIPASE, AMYLASE in the last 168 hours. No  results for input(s): AMMONIA in the last 168 hours. Coagulation Profile:  Recent Labs Lab 06/26/17 0417  INR 1.00   Cardiac Enzymes: No results for input(s): CKTOTAL, CKMB, CKMBINDEX, TROPONINI in the last 168 hours. BNP (last 3 results) No results for input(s): PROBNP in the last 8760 hours. HbA1C: No results for input(s): HGBA1C in the last 72 hours. CBG:  Recent Labs Lab 06/26/17 1158 06/26/17 1612 06/26/17 2049 06/27/17 0720 06/27/17 1134  GLUCAP 114* 104* 259* 220* 205*   Lipid Profile:  Recent Labs  06/25/17 0532  CHOL 105  HDL 33*  LDLCALC 58  TRIG 69  CHOLHDL 3.2   Thyroid Function Tests: No results for input(s): TSH, T4TOTAL, FREET4, T3FREE, THYROIDAB in the last 72 hours. Anemia Panel: No results for input(s): VITAMINB12, FOLATE, FERRITIN, TIBC, IRON, RETICCTPCT in the last 72 hours. Urine analysis:    Component Value Date/Time   COLORURINE YELLOW 05/19/2016 1200   APPEARANCEUR CLEAR 05/19/2016 1200   LABSPEC 1.015 05/19/2016 1200   PHURINE 5.5 05/19/2016 1200   GLUCOSEU 250 (A) 05/19/2016 1200   HGBUR NEGATIVE 05/19/2016 1200   BILIRUBINUR NEGATIVE 05/19/2016 1200   KETONESUR NEGATIVE 05/19/2016 1200   PROTEINUR NEGATIVE 05/19/2016 1200   UROBILINOGEN 0.2 05/20/2013 0943   NITRITE NEGATIVE 05/19/2016 1200   LEUKOCYTESUR SMALL (A) 05/19/2016 1200   Sepsis Labs: (procalcitonin:4,lacticidven:4)   )No results found for this or any previous visit (from the past 240 hour(s)).    Radiology Studies: No results found.      Scheduled Meds: . aspirin EC  81 mg Oral Daily  . collagenase   Topical Daily  . enoxaparin (LOVENOX) injection  50 mg Subcutaneous Q24H  . furosemide  40 mg Oral Daily  . gabapentin  400 mg Oral TID  . insulin aspart  0-15 Units Subcutaneous TID WC  . insulin aspart  0-5 Units Subcutaneous QHS  . insulin aspart  4 Units Subcutaneous TID WC  . insulin detemir  20 Units Subcutaneous BID  . living well with  diabetes book- in spanish   Does not apply Once  . loratadine  10 mg Oral Daily  . metoprolol succinate  12.5 mg Oral Daily  . rosuvastatin  20 mg Oral Daily  . senna-docusate  1 tablet Oral BID  . sodium chloride flush  3 mL Intravenous Q12H  . sodium chloride flush  3 mL Intravenous Q12H   Continuous Infusions: . sodium chloride    . sodium chloride       LOS: 3 days    Time  spent: 25 minutes  Greater than 50% of the time spent on counseling and coordinating the care.   Manson Passey, MD [facility] Pager [phone]  If 7PM-7AM, please contact night-coverage [email or web address] Password [email or web address] 06/27/2017, 2:27 PM

## 2017-06-28 DIAGNOSIS — I2510 Atherosclerotic heart disease of native coronary artery without angina pectoris: Secondary | ICD-10-CM

## 2017-06-28 DIAGNOSIS — I340 Nonrheumatic mitral (valve) insufficiency: Secondary | ICD-10-CM

## 2017-06-28 DIAGNOSIS — I25118 Atherosclerotic heart disease of native coronary artery with other forms of angina pectoris: Secondary | ICD-10-CM

## 2017-06-28 DIAGNOSIS — R0600 Dyspnea, unspecified: Secondary | ICD-10-CM

## 2017-06-28 LAB — BASIC METABOLIC PANEL
Anion gap: 5 (ref 5–15)
Anion gap: 4 — ABNORMAL LOW (ref 5–15)
BUN: 36 mg/dL — AB (ref 6–20)
BUN: 37 mg/dL — ABNORMAL HIGH (ref 6–20)
CHLORIDE: 111 mmol/L (ref 101–111)
CHLORIDE: 110 mmol/L (ref 101–111)
CO2: 26 mmol/L (ref 22–32)
CO2: 28 mmol/L (ref 22–32)
Calcium: 8.2 mg/dL — ABNORMAL LOW (ref 8.9–10.3)
Calcium: 8.5 mg/dL — ABNORMAL LOW (ref 8.9–10.3)
Creatinine, Ser: 1.13 mg/dL — ABNORMAL HIGH (ref 0.44–1.00)
Creatinine, Ser: 1.18 mg/dL — ABNORMAL HIGH (ref 0.44–1.00)
GFR calc Af Amer: >60 mL/min (ref >60)
GFR calc non Af Amer: 53 mL/min — ABNORMAL LOW (ref >60)
GFR, EST AFRICAN AMERICAN: 59 mL/min — AB (ref >60)
GFR, EST NON AFRICAN AMERICAN: 51 mL/min — AB (ref >60)
GLUCOSE: 175 mg/dL — AB (ref 65–99)
Glucose, Bld: 179 mg/dL — ABNORMAL HIGH (ref 65–99)
POTASSIUM: 5 mmol/L (ref 3.5–5.1)
POTASSIUM: 4.6 mmol/L (ref 3.5–5.1)
SODIUM: 142 mmol/L (ref 135–145)
Sodium: 142 mmol/L (ref 135–145)

## 2017-06-28 LAB — GLUCOSE, CAPILLARY
GLUCOSE-CAPILLARY: 223 mg/dL — AB (ref 65–99)
GLUCOSE-CAPILLARY: 216 mg/dL — AB (ref 65–99)
Glucose-Capillary: 123 mg/dL — ABNORMAL HIGH (ref 65–99)
Glucose-Capillary: 163 mg/dL — ABNORMAL HIGH (ref 65–99)

## 2017-06-28 LAB — CBC
HEMATOCRIT: 25.6 % — AB (ref 36.0–46.0)
HEMOGLOBIN: 7.8 g/dL — AB (ref 12.0–15.0)
MCH: 25.7 pg — AB (ref 26.0–34.0)
MCHC: 30.5 g/dL (ref 30.0–36.0)
MCV: 84.2 fL (ref 78.0–100.0)
Platelets: 250 10*3/uL (ref 150–400)
RBC: 3.04 MIL/uL — ABNORMAL LOW (ref 3.87–5.11)
RDW: 15.8 % — ABNORMAL HIGH (ref 11.5–15.5)
WBC: 7.9 10*3/uL (ref 4.0–10.5)

## 2017-06-28 NOTE — Progress Notes (Signed)
Patient ID: Jill [name], female   DOB: 13-Jul-1961, 56 y.o.   MRN: [id number]  PROGRESS NOTE    Edris Schneck  ZHY:[id number] DOB: 1960-12-27 DOA: 06/18/2017  PCP: [facility]   Brief Narrative:  56 year old female with DM and neuropathy, diabetic foot ulceration, hypertension, dyslipidemia who was seen at [facility] on 8/29 for diabetic foot ulceration. Her BMP at that time showed potassium 7.5 and she went to ED for evaluation. Admission EKG shwoed T wave inversion in v4-v6, III, AVF. ECHO showed EF 45% and moderate to severe MR. She was transferred to [facility]. A TEE showed moderate to severe MR. Catheterization showed 2 vessel CAD. Per CTS, options include medical therapy versus CABG/mitral repair or replacement, CTS thinks she will do better long term with CABG but this can be an elective surgery sch on outpatient basis.   Assessment & Plan:   Active Problems:    Chest pain / Hyperkalemia / Mild trop elevation / moderate to severe mitral regurgitation / Chronic diastolic and systolic CHF - Stabilized - Trop minimally elevated, 0.30, 0.26, 0.25 - CVD risk factors: DM, HTN and hyperlipidemia - Had cath done, showed severe disease in the circumflex and moderate RCA disease, diffuse disease in the LAD  - As noted, above, per CTS pt may do better long term with CABG which can be elective and done on outpatient basis     Right heel unstageable pressure injury - Seen by WOC    Essential hypertension - Continue metoprolol     Type 2 diabetes mellitus, uncontrolled, with neuropathy (HCC) - Continue Levemir 20 units BID and SSI - Continue gabapentin for neuropathy - CBG's in past 24 hours: 117, 183, 123     Dyslipidemia associated with type 2 DM - Continue rosuvastatin     CKD (chronic kidney disease) stage 3, GFR 30-59 ml/min - Cr slightly up this am, 1.13 (it was 1.02 yesterday) - Increase likely from lasix - Cardio following - Follow up BMP in  am    Anemia of chronic disease - Likely related to CKD - Monitor daily CBC - Slight drop in hgb this am, will stop Lovenox subQ for DVT prophylaxis and use SCD's      DVT prophylaxis: SCD's Code Status: full code  Family Communication: no family at the bedside  Disposition Plan: home once cleared by cardio    Consultants:   CTS  Cardio  WOC  Procedures:   ECHO 8/31 - E 45-50%, grade 2 DD, moderate to severe MR  TEE 9/6 - EF 45-50%, MV - thickening consistent with rheumatic disease, no vegetation   Cath 9/7 - severe 2 vessel disease  Antimicrobials:   None    Subjective: No overnight events.  Objective: Vitals:   06/27/17 0900 06/27/17 1245 06/27/17 1943 06/28/17 0533  BP: (!) 102/47 115/61 (!) 99/53 (!) 100/49  Pulse: 82 79 69 69  Resp: Temp: 98.3 F (36.8 C) 98.4 F (36.9 C) 98.4 F (36.9 C) 98.3 F (36.8 C)  TempSrc: Oral Oral Oral Oral  SpO2: 95% 98% 98% 92%  Weight:    102.7 kg (226 lb 6.4 oz)  Height:        Intake/Output Summary (Last 24 hours) at 06/28/17 0814 Last data filed at 06/28/17 0500  Gross per 24 hour  Intake              480 ml  Output  1000 ml  Net             -520 ml   Filed Weights   06/26/17 0450 06/27/17 0557 06/28/17 0533  Weight: 102.3 kg (225 lb 9.6 oz) 102.6 kg (226 lb 1.6 oz) 102.7 kg (226 lb 6.4 oz)   Physical Exam  Constitutional: Appears well-developed and well-nourished. No distress.  CVS: RRR, S1/S2 +,2/6 SEM appreciated  Pulmonary: Effort and breath sounds normal, no stridor, rhonchi, wheezes, rales.  Abdominal: Soft. BS +,  no distension, tenderness, rebound or guarding.  Musculoskeletal: Normal range of motion. No edema and no tenderness.  Lymphadenopathy: No lymphadenopathy noted, cervical, inguinal. Neuro: Alert. No cranial nerve deficit. Skin: diabetic foot ulceration  Psychiatric: Normal mood and affect. Behavior, judgment, thought content normal.     Data Reviewed: I have  personally reviewed following labs and imaging studies  CBC:  Recent Labs Lab 06/26/17 0417 06/27/17 0401 06/28/17 0441  WBC 8.3 7.7 7.9  HGB 8.5* 8.3* 7.8*  HCT 27.1* 27.8* 25.6*  MCV 84.4 84.5 84.2  PLT 273 255 250   Basic Metabolic Panel:  Recent Labs Lab 06/23/17 0421 06/25/17 0532 06/26/17 0417 06/27/17 0401 06/28/17 0441  NA 142 142 141 139 142  K 5.1 5.0 5.0 5.2* 5.0  CL 112* 113* 109 110 111  CO2 26 25 26 22 26   GLUCOSE 133* 158* 191* 207* 175*  BUN 28* 29* 29* 30* 36*  CREATININE 0.81 0.85 0.94 1.02* 1.13*  CALCIUM 8.0* 8.2* 8.2* 8.1* 8.2*   GFR: Estimated Creatinine Clearance: 63.6 mL/min (A) (by C-G formula based on SCr of 1.13 mg/dL (H)). Liver Function Tests:  Recent Labs Lab 06/25/17 0532  AST 34  ALT 38  ALKPHOS 75  BILITOT 0.6  PROT 6.0*  ALBUMIN 2.7*   No results for input(s): LIPASE, AMYLASE in the last 168 hours. No results for input(s): AMMONIA in the last 168 hours. Coagulation Profile:  Recent Labs Lab 06/26/17 0417  INR 1.00   Cardiac Enzymes: No results for input(s): CKTOTAL, CKMB, CKMBINDEX, TROPONINI in the last 168 hours. BNP (last 3 results) No results for input(s): PROBNP in the last 8760 hours. HbA1C: No results for input(s): HGBA1C in the last 72 hours. CBG:  Recent Labs Lab 06/27/17 0720 06/27/17 1134 06/27/17 1704 06/27/17 2103 06/28/17 0742  GLUCAP 220* 205* 117* 183* 123*   Lipid Profile: No results for input(s): CHOL, HDL, LDLCALC, TRIG, CHOLHDL, LDLDIRECT in the last 72 hours. Thyroid Function Tests: No results for input(s): TSH, T4TOTAL, FREET4, T3FREE, THYROIDAB in the last 72 hours. Anemia Panel: No results for input(s): VITAMINB12, FOLATE, FERRITIN, TIBC, IRON, RETICCTPCT in the last 72 hours. Urine analysis:  Sepsis Labs: @LABRCNTIP (procalcitonin:4,lacticidven:4)   )No results found for this or any previous visit (from the past 240 hour(s)).    Radiology Studies: No results  found.      Scheduled Meds: . aspirin EC  81 mg Oral Daily  . collagenase   Topical Daily  . enoxaparin (LOVENOX) injection  50 mg Subcutaneous Q24H  . furosemide  40 mg Oral Daily  . gabapentin  400 mg Oral TID  . insulin aspart  0-15 Units Subcutaneous TID WC  . insulin aspart  0-5 Units Subcutaneous QHS  . insulin aspart  4 Units Subcutaneous TID WC  . insulin detemir  20 Units Subcutaneous BID  . living well with diabetes book- in spanish   Does not apply Once  . loratadine  10 mg Oral Daily  . metoprolol  succinate  12.5 mg Oral Daily  . rosuvastatin  20 mg Oral Daily  . senna-docusate  1 tablet Oral BID  . sodium chloride flush  3 mL Intravenous Q12H  . sodium chloride flush  3 mL Intravenous Q12H   Continuous Infusions: . sodium chloride    . sodium chloride       LOS: 4 days    Time spent: 25 minutes  Greater than 50% of the time spent on counseling and coordinating the care.   Manson Passey, MD [facility] Pager [phone]  If 7PM-7AM, please contact night-coverage [email or web address] Password [email or web address] 06/28/2017, 8:14 AM

## 2017-06-28 NOTE — Progress Notes (Signed)
Progress Note  Patient Name: Jill [name] Date of Encounter: 06/28/2017  Primary Cardiologist: Dr. Diona Browner  Subjective   Feeling well. Wants to go home. Denies chest pain, shortness of breath, hematochezia, and melena.  Inpatient Medications    Scheduled Meds: . aspirin EC  81 mg Oral Daily  . collagenase   Topical Daily  . furosemide  40 mg Oral Daily  . gabapentin  400 mg Oral TID  . insulin aspart  0-15 Units Subcutaneous TID WC  . insulin aspart  0-5 Units Subcutaneous QHS  . insulin aspart  4 Units Subcutaneous TID WC  . insulin detemir  20 Units Subcutaneous BID  . living well with diabetes book- in spanish   Does not apply Once  . loratadine  10 mg Oral Daily  . metoprolol succinate  12.5 mg Oral Daily  . rosuvastatin  20 mg Oral Daily  . senna-docusate  1 tablet Oral BID  . sodium chloride flush  3 mL Intravenous Q12H  . sodium chloride flush  3 mL Intravenous Q12H   Continuous Infusions: . sodium chloride    . sodium chloride     PRN Meds: sodium chloride, sodium chloride, acetaminophen **OR** acetaminophen, ondansetron **OR** ondansetron (ZOFRAN) IV, sodium chloride flush, sodium chloride flush   Vital Signs    Vitals:   06/27/17 1245 06/27/17 1943 06/28/17 0533 06/28/17 1007  BP: 115/61 (!) 99/53 (!) 100/49 118/71  Pulse: 79 69 69 73  Resp: Temp: 98.4 F (36.9 C) 98.4 F (36.9 C) 98.3 F (36.8 C) 98.2 F (36.8 C)  TempSrc: Oral Oral Oral Oral  SpO2: 98% 98% 92% 95%  Weight:   226 lb 6.4 oz (102.7 kg)   Height:        Intake/Output Summary (Last 24 hours) at 06/28/17 1023 Last data filed at 06/28/17 0900  Gross per 24 hour  Intake              720 ml  Output             1301 ml  Net             -581 ml   Filed Weights   06/26/17 0450 06/27/17 0557 06/28/17 0533  Weight: 225 lb 9.6 oz (102.3 kg) 226 lb 1.6 oz (102.6 kg) 226 lb 6.4 oz (102.7 kg)    Telemetry    NSR - Personally Reviewed  ECG    n/a - Personally  Reviewed  Physical Exam   GEN: No acute distress.   Neck: No JVD Cardiac: RRR, no murmurs, rubs, or gallops.  Respiratory: Diffuse faint crackles b/l GI: Soft, nontender, non-distended  MS: No edema; No deformity. Neuro:  Nonfocal  Psych: Normal affect   Labs    Chemistry Recent Labs Lab 06/25/17 0532  06/27/17 0401 06/28/17 0441 06/28/17 0906  NA 142  < > 139 142 142  K 5.0  < > 5.2* 5.0 4.6  CL 113*  < > 110 111 110  CO2 25  < > GLUCOSE 158*  < > 207* 175* 179*  BUN 29*  < > 30* 36* 37*  CREATININE 0.85  < > 1.02* 1.13* 1.18*  CALCIUM 8.2*  < > 8.1* 8.2* 8.5*  PROT 6.0*  --   --   --   --   ALBUMIN 2.7*  --   --   --   --   AST 34  --   --   --   --  ALT 38  --   --   --   --   ALKPHOS 75  --   --   --   --   BILITOT 0.6  --   --   --   --   GFRNONAA >60  < > >60 53* 51*  GFRAA >60  < > >60 >60 59*  ANIONGAP 4*  < > 7 5 4*  < > = values in this interval not displayed.   Hematology Recent Labs Lab 06/26/17 0417 06/27/17 0401 06/28/17 0441  WBC 8.3 7.7 7.9  RBC 3.21* 3.29* 3.04*  HGB 8.5* 8.3* 7.8*  HCT 27.1* 27.8* 25.6*  MCV 84.4 84.5 84.2  MCH 26.5 25.2* 25.7*  MCHC 31.4 29.9* 30.5  RDW 15.2 15.4 15.8*  PLT 273 255 250    Cardiac EnzymesNo results for input(s): TROPONINI in the last 168 hours. No results for input(s): TROPIPOC in the last 168 hours.   BNPNo results for input(s): BNP, PROBNP in the last 168 hours.   DDimer No results for input(s): DDIMER in the last 168 hours.   Radiology    No results found.  Cardiac Studies   Cath 06/26/17:   Prox RCA lesion, 70 %stenosed. Mid RCA lesion, 40 %stenosed. Dist RCA lesion, 40 %stenosed.  Ost 2nd Diag to 2nd Diag lesion, 90 %stenosed.  Cx-OM1 bifurcation: Prox Cx lesion, 80 %stenosed. Mid Cx lesion, 100 %stenosed. Ost 1st Mrg lesion, 70 %stenosed.  There is moderate left ventricular systolic dysfunction. The left ventricular ejection fraction is 35-45% by visual estimate.  LV  end diastolic pressure is severely elevated.  There is moderate (3+) mitral regurgitation by LV Gram, but Lage V wave on PCWP would suggest Severe MR.  There is no aortic valve stenosis.  Hemodynamic findings consistent with severe pulmonary hypertension. Mostly Secondary to MR & elevated LVEDP.  Severe 2 Vessel disease (moderate to severe RCA and one or percent mid circumflex-OM occlusion) along with severe diffusely diseased very small caliber Diag branches and mild diffuse disease throughout the LAD.  Most likely at least moderate-severe mitral regurgitation based on large V wave.  Recommend CV surgery consultation to determine the best course of action would treating the patient's mitral valve and CAD.  TEE 06/26/17:  - Left ventricle: Systolic function was mildly reduced. The estimated ejection fraction was in the range of 45% to 50%. Mild diffuse hypokinesis with no identifiable regional variations. - Aortic valve: No evidence of vegetation. - Mitral valve: Thickening, consistent with rheumatic disease. No evidence of vegetation. There was moderate regurgitation directed centrally, eccentrically, and anteriorly. - Left atrium: The atrium was moderately dilated. No evidence of thrombus in the atrial cavity or appendage. There was spontaneous echo contrast (&quot;smoke&quot;). - Right atrium: No evidence of thrombus in the atrial cavity or appendage. No evidence of thrombus in the atrial cavity or appendage. - Atrial septum: No defect or patent foramen ovale was identified. - Tricuspid valve: No evidence of vegetation. There was mild-moderate regurgitation directed centrally. - Pulmonic valve: No evidence of vegetation. No evidence of vegetation. - Pulmonary arteries: Systolic pressure was moderately increased. PA peak pressure: 59 mm Hg (S).  Impressions:  - Although the MR jet is eccentric and could be underestimated by color Doppler, there  is consistent evidence for moderate severity based on continuity equation-derived regurgitant fraction, PISA-derived effective regurgitant orifice area and absence of pulmonary vein systolic flow reversal.  Patient Profile     56 y.o. female with a history of  Type 2 diabetes mellitus with peripheral neuropathy, hypertension, and hyperlipidemia admitted for hyperkalemia with K+ 7.5 who is being seen for the evaluation of exertional shortness of breath and chest discomfort. 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 Mechanism of her mitral regurgitation wasnot clear, no obvious leaflet incompetence or substantial annular dilatation was found.   Assessment & Plan    1. CAD with angina: Symptomatically stable. On ASA, beta blocker, and statin. CT surgery plans for outpt CABG and MV repair vs MVR.  2. Moderate MR: TEE details above. CT surgery plans for outpt CABG and MV repair vs MVR.  3. Hypertension: Controlled. No changes.  4. Hyperlipidemia: On statin. LDL at goal.  5. Hyperkalemia: Normal.  6. Acute systolic heart failure: Symptomatically stable. Continue Lasix 40 mg daily and monitor renal function.   Dispo: Can be discharged to home from my standpoint.   For questions or updates, please contact [facility] Please consult [email or web address] for contact info under Cardiology/STEMI. Daytime calls, contact the Day Call APP (6a-8a) or assigned team (Teams A-D) provider (7:30a - 5p). All other daytime calls (7:30-5p), contact the Card Master @ [phone].   Nighttime calls, contact the assigned APP (5p-8p) or MD (6:30p-8p). Overnight calls (8p-6a), contact the on call Fellow @ [phone].      Signed, Prentice Docker, MD  06/28/2017, 10:23 AM

## 2017-06-29 ENCOUNTER — Inpatient Hospital Stay (HOSPITAL_COMMUNITY): Payer: Medicaid Other

## 2017-06-29 ENCOUNTER — Encounter (HOSPITAL_COMMUNITY): Payer: Self-pay | Admitting: Cardiology

## 2017-06-29 ENCOUNTER — Encounter (HOSPITAL_COMMUNITY): Payer: Self-pay

## 2017-06-29 DIAGNOSIS — Z01818 Encounter for other preprocedural examination: Secondary | ICD-10-CM

## 2017-06-29 DIAGNOSIS — R609 Edema, unspecified: Secondary | ICD-10-CM

## 2017-06-29 DIAGNOSIS — I059 Rheumatic mitral valve disease, unspecified: Secondary | ICD-10-CM

## 2017-06-29 LAB — CBC
HEMATOCRIT: 28.4 % — AB (ref 36.0–46.0)
HEMOGLOBIN: 8.4 g/dL — AB (ref 12.0–15.0)
MCH: 25 pg — ABNORMAL LOW (ref 26.0–34.0)
MCHC: 29.6 g/dL — AB (ref 30.0–36.0)
MCV: 84.5 fL (ref 78.0–100.0)
Platelets: 290 10*3/uL (ref 150–400)
RBC: 3.36 MIL/uL — AB (ref 3.87–5.11)
RDW: 15.5 % (ref 11.5–15.5)
WBC: 9.3 10*3/uL (ref 4.0–10.5)

## 2017-06-29 LAB — BASIC METABOLIC PANEL
Anion gap: 7 (ref 5–15)
BUN: 33 mg/dL — ABNORMAL HIGH (ref 6–20)
CHLORIDE: 110 mmol/L (ref 101–111)
CO2: 25 mmol/L (ref 22–32)
CREATININE: 0.96 mg/dL (ref 0.44–1.00)
Calcium: 8.2 mg/dL — ABNORMAL LOW (ref 8.9–10.3)
Glucose, Bld: 140 mg/dL — ABNORMAL HIGH (ref 65–99)
POTASSIUM: 5.1 mmol/L (ref 3.5–5.1)
SODIUM: 142 mmol/L (ref 135–145)

## 2017-06-29 LAB — GLUCOSE, CAPILLARY
GLUCOSE-CAPILLARY: 167 mg/dL — AB (ref 65–99)
GLUCOSE-CAPILLARY: 115 mg/dL — AB (ref 65–99)
Glucose-Capillary: 150 mg/dL — ABNORMAL HIGH (ref 65–99)

## 2017-06-29 MED ORDER — FUROSEMIDE 40 MG PO TABS: 40.0000 mg | ORAL_TABLET | Freq: Every day | ORAL | 0 refills | Status: DC

## 2017-06-29 MED ORDER — METOPROLOL SUCCINATE ER 25 MG PO TB24: 12.5000 mg | ORAL_TABLET | Freq: Every day | ORAL | 0 refills | Status: DC

## 2017-06-29 MED ORDER — SENNOSIDES-DOCUSATE SODIUM 8.6-50 MG PO TABS: 1.0000 | ORAL_TABLET | Freq: Every day | ORAL | 0 refills | Status: DC

## 2017-06-29 MED ORDER — IOPAMIDOL (ISOVUE-370) INJECTION 76%
INTRAVENOUS | Status: AC
  Administered 2017-06-29: 100 mL via INTRAVENOUS
  Filled 2017-06-29: qty 100

## 2017-06-29 MED FILL — Lidocaine HCl Local Preservative Free (PF) Inj 1%: INTRAMUSCULAR | Qty: 30 | Status: AC

## 2017-06-29 NOTE — Op Note (Signed)
INDICATIONS: mitral insufficiency  PROCEDURE:   Informed consent was obtained prior to the procedure. The risks, benefits and alternatives for the procedure were discussed and the patient comprehended these risks.  Risks include, but are not limited to, cough, sore throat, vomiting, nausea, somnolence, esophageal and stomach trauma or perforation, bleeding, low blood pressure, aspiration, pneumonia, infection, trauma to the teeth and death.    After a procedural time-out, the oropharynx was anesthetized with 20% benzocaine spray.   During this procedure the patient was administered IV Versed and Fentanyl to achieve and maintain moderate conscious sedation.  The patient's heart rate, blood pressure, and oxygen saturationweare monitored continuously during the procedure. The period of conscious sedation was 15 minutes, of which I was present face-to-face 100% of this time.  The transesophageal probe was inserted in the esophagus and stomach without difficulty and multiple views were obtained.  The patient was kept under observation until the patient left the procedure room.  The patient left the procedure room in stable condition.   Agitated microbubble saline contrast was not administered.  COMPLICATIONS:    There were no immediate complications.  FINDINGS:  Mildly depressed LVEF 45-50 percent, moderate central mitral insufficiency    Time Spent Directly with the Patient:  30 minutes   Jill [name] 06/29/2017, 5:29 PM

## 2017-06-29 NOTE — Progress Notes (Signed)
*  PRELIMINARY RESULTS* Vascular Ultrasound Bilateral lower extremity venous duplex has been completed.  Preliminary findings: No evidence of deep vein thrombosis or baker's cysts bilaterally.   Chauncey Fischer 06/29/2017, 1:56 PM

## 2017-06-29 NOTE — Care Management Note (Signed)
Case Management Note  Patient Details  Name: Karlia Schlaefer MRN: [id number] Date of Birth: 1961/05/13  Subjective/Objective:     Hyperkalemia              Action/Plan: Patient lives at home with her spouse; Spanish speaking female, she understands some English, her son was present and interpreted the conversation; she goes to [facility] for primary care and gets her medication there also; possibly need home oxygen at discharge, awaiting for qualifying  Oxygen saturations to order home oxygen.CM will continue to follow for DCP.; also noted plans for CABG/MVR soon.  Expected Discharge Date:  Possibly 06/20/2017             Expected Discharge Plan:  Home/Self Care  Discharge planning Services  CM Consult  Status of Service:  In process, will continue to follow  Reola Mosher [phone] 06/29/2017, 1:01 PM

## 2017-06-29 NOTE — Evaluation (Addendum)
Physical Therapy Evaluation Patient Details Name: Jill [name] MRN: [id number] DOB: [birth year] Today's Date: 06/29/2017   History of Present Illness  Jill [name] 56 year old female recently diagnosed with severe mitral regurgitation and coronary artery disease. Patient with anticipated mitral valve replacement and coronary artery bypass graft procedure with Dr. Dorris Fetch. Patient is now seen as part of a medically necessary pre-heart valve surgery dental protocol examination to rule out dental infection that may affect the patient's systemic health anticipated heart valve surgery.  Clinical Impression  Pt admitted with above diagnosis. Pt currently with functional limitations due to the deficits listed below (see PT Problem List). Pt was able to ambulate with RW with good safety overall and has a RW at home. Desats with activity as below.  Will follow acutely.   Pt will benefit from skilled PT to increase their independence and safety with mobility to allow discharge to the venue listed below.   Orthostatic BPs  Supine 108/79, 70 bpm  Sitting 109/52, 70 bpm  Standing 104/52, 69 bpm  Standing after 3 min 110/54, 68 bpm    SATURATION QUALIFICATIONS: (This note is used to comply with regulatory documentation for home oxygen)  Patient Saturations on Room Air at Rest = 87%  Patient Saturations on Room Air while Ambulating = 83%  Patient Saturations on 3 Liters of oxygen while Ambulating = >90%  Please briefly explain why patient needs home oxygen:Pt requires 2LO2 at rest to maintain sats >90% and 3LO2 with activity to maintain sats >90%.   Follow Up Recommendations No PT follow up;Supervision/Assistance - 24 hour    Equipment Recommendations  Other (comment) (home O2)    Recommendations for Other Services       Precautions / Restrictions Precautions Precautions: Fall Restrictions Weight Bearing Restrictions: No      Mobility  Bed Mobility Overal bed mobility:  Independent                Transfers Overall transfer level: Independent                  Ambulation/Gait Ambulation/Gait assistance: Min guard Ambulation Distance (Feet): 180 Feet Assistive device: Rolling walker (2 wheeled) Gait Pattern/deviations: Step-through pattern;Decreased stride length   Gait velocity interpretation: Below normal speed for age/gender General Gait Details: Pt safe overall with RW.  Pt desats on RA at rest and with activity.  See O2 note.   Stairs            Wheelchair Mobility    Modified Rankin (Stroke Patients Only)       Balance Overall balance assessment: Needs assistance Sitting-balance support: No upper extremity supported;Feet supported Sitting balance-Leahy Scale: Good     Standing balance support: Bilateral upper extremity supported;During functional activity Standing balance-Leahy Scale: Fair Standing balance comment: can stand statically without UE support                             Pertinent Vitals/Pain Pain Assessment: No/denies pain    Home Living Family/patient expects to be discharged to:: Private residence Living Arrangements: Spouse/significant other;Children Available Help at Discharge: Family;Available 24 hours/day Type of Home: House Home Access: Level entry     Home Layout: One level Home Equipment: Walker - 2 wheels      Prior Function Level of Independence: Independent               Hand Dominance        Extremity/Trunk Assessment  Upper Extremity Assessment Upper Extremity Assessment: Defer to OT evaluation    Lower Extremity Assessment Lower Extremity Assessment: Overall WFL for tasks assessed    Cervical / Trunk Assessment Cervical / Trunk Assessment: Normal  Communication   Communication: Prefers language other than [place];Interpreter utilized  Cognition Arousal/Alertness: Awake/alert Behavior During Therapy: WFL for tasks assessed/performed Overall  Cognitive Status: Within Functional Limits for tasks assessed                                        General Comments      Exercises     Assessment/Plan    PT Assessment Patient needs continued PT services  PT Problem List Decreased activity tolerance;Decreased balance;Decreased mobility;Decreased knowledge of use of DME;Decreased safety awareness;Decreased knowledge of precautions       PT Treatment Interventions DME instruction;Gait training;Functional mobility training;Therapeutic activities;Therapeutic exercise;Balance training;Patient/family education    PT Goals (Current goals can be found in the Care Plan section)  Acute Rehab PT Goals Patient Stated Goal: to go home PT Goal Formulation: With patient Time For Goal Achievement: 07/13/17 Potential to Achieve Goals: Good    Frequency Min 3X/week   Barriers to discharge        Co-evaluation               AM-PAC PT "6 Clicks" Daily Activity  Outcome Measure Difficulty turning over in bed (including adjusting bedclothes, sheets and blankets)?: None Difficulty moving from lying on back to sitting on the side of the bed? : None Difficulty sitting down on and standing up from a chair with arms (e.g., wheelchair, bedside commode, etc,.)?: None Help needed moving to and from a bed to chair (including a wheelchair)?: None Help needed walking in hospital room?: A Little Help needed climbing 3-5 steps with a railing? : A Lot 6 Click Score: 21    End of Session Equipment Utilized During Treatment: Gait belt;Oxygen Activity Tolerance: Patient limited by fatigue Patient left: in chair;with call bell/phone within reach;with family/visitor present Nurse Communication: Mobility status PT Visit Diagnosis: Muscle weakness (generalized) (M62.81);Other abnormalities of gait and mobility (R26.89)    Time: 1205-1228 PT Time Calculation (min) (ACUTE ONLY): 23 min   Charges:   PT Evaluation $PT Eval Moderate  Complexity: 1 Mod PT Treatments $Gait Training: 8-22 mins   PT G Codes:        Summerlynn Glauser,PT Acute Rehabilitation [phone] (pager)   Berline Lopes 06/29/2017, 2:01 PM

## 2017-06-29 NOTE — Progress Notes (Addendum)
SATURATION QUALIFICATIONS: (This note is used to comply with regulatory documentation for home oxygen)  Patient Saturations on Room Air at Rest = 87%  Patient Saturations on Room Air while Ambulating = 83%  Patient Saturations on 3 Liters of oxygen while Ambulating = >90%  Please briefly explain why patient needs home oxygen:Pt requires 2LO2 at rest to maintain sats >90% and 3LO2 with activity to maintain sats >90%.  Thanks.  [facility] Acute Rehabilitation [phone] (pager)

## 2017-06-29 NOTE — Progress Notes (Signed)
[id number] Did not walk with pt since she had not been seen by MD today. Stated she does not remember her fall this morning. Has had CT scan. Stated her head hurts. Son and husband present for ed. Pt stated she would like for her  son to translate for her. Knows hospital would provide translator. Son signed waiver and I put on her chart. Gave [facility] booklet and care guide. Son stated he could read [place]. Put on pre op video in Spanish for all to view. Discussed sternal precautions and demonstrated getting up and down without use of arms. Discussed importance of IS and walking. Needs IS. Family stated they will work it out so she has someone with her 24/7 first week when home after surgery. Luetta Nutting RN BSN 06/29/2017 11:29 AM

## 2017-06-29 NOTE — Consult Note (Signed)
DENTAL CONSULTATION  Date of Consultation:  06/29/2017 Patient Name:   Jill [name] Date of Birth:   11-09-1960 Medical Record Number: [id number]  VITALS: BP (!) 104/58 (BP Location: Left Arm)   Pulse 70   Temp 98.2 F (36.8 C) (Oral)   Resp 18   Ht  (1.6 m)   Wt 225 lb 9.6 oz (102.3 kg)   SpO2 98%   BMI 39.96 kg/m   CHIEF COMPLAINT: Patient referred by Dr. Dorris Fetch for a dental consultation.  HPI: Caleigh Rabelo 56 year old female recently diagnosed with severe mitral regurgitation and coronary artery disease. Patient with anticipated mitral valve replacement and coronary artery bypass graft procedure with Dr. Dorris Fetch. Patient is now seen as part of a medically necessary pre-heart valve surgery dental protocol examination to rule out dental infection that may affect the patient's systemic health anticipated heart valve surgery.  The patient currently denies acute toothaches, swellings, or abscesses. Patient was last seen by dentist in [place] approximately 5 years ago. Patient had a maxillary bridge placed to close multiple space between her maxillary teeth by patient report. Patient also had a lower partial denture fabricated at that time. Patient denies having any problems associated with lower partial denture. Patient denies having dental phobia.  PROBLEM LIST: Patient Active Problem List   Diagnosis Date Noted  . Dyspnea   . Coronary artery disease   . Severe mitral insufficiency   . Atrial flutter (HCC)   . Mitral regurgitation 06/24/2017  . Cardiomyopathy (HCC) 06/20/2017  . Elevated troponin 06/19/2017  . CKD (chronic kidney disease) stage 3, GFR 30-59 ml/min 06/19/2017  . Pressure injury of skin 06/19/2017  . Hyperkalemia 06/18/2017  . Type 2 diabetes mellitus, uncontrolled, with neuropathy (HCC) 06/18/2017  . Hyperlipidemia 06/18/2017  . Chest pain 06/18/2017  . Bronchopneumonia 03/06/2016  . CAP (community acquired pneumonia) 03/05/2016  . Insulin  dependent diabetes mellitus (HCC) 03/05/2016  . Essential hypertension 03/05/2016  . Hyponatremia 03/05/2016  . Normocytic anemia 03/05/2016  . Hyperglycemia 03/05/2016  . Dehydration 03/05/2016  . Hypoxia 03/05/2016  . Leukocytosis 03/05/2016  . Acute respiratory failure with hypoxia (HCC) 03/05/2016    PMH: Past Medical History:  Diagnosis Date  . Diabetic neuropathy (HCC)   . Hyperlipidemia   . Hypertension   . Type 2 diabetes mellitus (HCC)     PSH: Past Surgical History:  Procedure Laterality Date  . RIGHT/LEFT HEART CATH AND CORONARY ANGIOGRAPHY N/A 06/26/2017   Procedure: RIGHT/LEFT HEART CATH AND CORONARY ANGIOGRAPHY;  Surgeon: Marykay Lex, MD;  Location: [facility] INVASIVE CV LAB;  Service: Cardiovascular;  Laterality: N/A;  . TEE WITHOUT CARDIOVERSION N/A 06/25/2017   Procedure: TRANSESOPHAGEAL ECHOCARDIOGRAM (TEE);  Surgeon: Thurmon Fair, MD;  Location: [facility] ENDOSCOPY;  Service: Cardiovascular;  Laterality: N/A;  . TUBAL LIGATION      ALLERGIES: No Known Allergies  MEDICATIONS: Current Facility-Administered Medications  Medication Dose Route Frequency Provider Last Rate Last Dose  . 0.9 %  sodium chloride infusion  250 mL Intravenous PRN Marykay Lex, MD      . 0.9 %  sodium chloride infusion  250 mL Intravenous PRN Marykay Lex, MD      . acetaminophen (TYLENOL) tablet 650 mg  650 mg Oral Q6H PRN Catarina Hartshorn, MD   650 mg at 06/29/17 0919   Or  . acetaminophen (TYLENOL) suppository 650 mg  650 mg Rectal Q6H PRN Tat, Onalee Hua, MD      . aspirin EC tablet 81 mg  81 mg  Oral Daily Tat, Onalee Hua, MD   81 mg at 06/29/17 0919  . collagenase (SANTYL) ointment   Topical Daily Albertine Grates, MD      . furosemide (LASIX) tablet 40 mg  40 mg Oral Daily Laqueta Linden, MD   40 mg at 06/29/17 0918  . gabapentin (NEURONTIN) capsule 400 mg  400 mg Oral TID Catarina Hartshorn, MD   400 mg at 06/29/17 0919  . insulin aspart (novoLOG) injection 0-15 Units  0-15 Units Subcutaneous TID WC  Catarina Hartshorn, MD   2 Units at 06/29/17 1147  . insulin aspart (novoLOG) injection 0-5 Units  0-5 Units Subcutaneous Maura Crandall, MD   3 Units at 06/26/17 2154  . insulin aspart (novoLOG) injection 4 Units  4 Units Subcutaneous TID WC Catarina Hartshorn, MD   4 Units at 06/29/17 1151  . insulin detemir (LEVEMIR) injection 20 Units  20 Units Subcutaneous BID Albertine Grates, MD   20 Units at 06/29/17 0920  . living well with diabetes book- in spanish   Does not apply Once Tat, David, MD      . loratadine (CLARITIN) tablet 10 mg  10 mg Oral Daily Tat, Onalee Hua, MD   10 mg at 06/29/17 0918  . metoprolol succinate (TOPROL-XL) 24 hr tablet 12.5 mg  12.5 mg Oral Daily Antoine Poche, MD   12.5 mg at 06/29/17 0920  . ondansetron (ZOFRAN) tablet 4 mg  4 mg Oral Q6H PRN Tat, David, MD       Or  . ondansetron (ZOFRAN) injection 4 mg  4 mg Intravenous Q6H PRN Tat, David, MD      . rosuvastatin (CRESTOR) tablet 20 mg  20 mg Oral Daily Tat, David, MD   20 mg at 06/29/17 9604  . senna-docusate (Senokot-S) tablet 1 tablet  1 tablet Oral BID Albertine Grates, MD   1 tablet at 06/29/17 0919  . sodium chloride flush (NS) 0.9 % injection 3 mL  3 mL Intravenous Q12H Marykay Lex, MD   3 mL at 06/29/17 5409  . sodium chloride flush (NS) 0.9 % injection 3 mL  3 mL Intravenous PRN Marykay Lex, MD      . sodium chloride flush (NS) 0.9 % injection 3 mL  3 mL Intravenous Q12H Marykay Lex, MD   3 mL at 06/29/17 0301  . sodium chloride flush (NS) 0.9 % injection 3 mL  3 mL Intravenous PRN Marykay Lex, MD        LABS: Lab Results  Component Value Date   WBC 9.3 06/29/2017   HGB 8.4 (L) 06/29/2017   HCT 28.4 (L) 06/29/2017   MCV 84.5 06/29/2017   PLT 290 06/29/2017      Component Value Date/Time   NA 142 06/29/2017 0334   K 5.1 06/29/2017 0334   CL 110 06/29/2017 0334   CO2 25 06/29/2017 0334   GLUCOSE 140 (H) 06/29/2017 0334   BUN 33 (H) 06/29/2017 0334   CREATININE 0.96 06/29/2017 0334   CALCIUM 8.2 (L)  06/29/2017 0334   GFRNONAA >60 06/29/2017 0334   GFRAA >60 06/29/2017 0334   Lab Results  Component Value Date   INR 1.00 06/26/2017   No results found for: PTT  SOCIAL HISTORY: Social History   Social History  . Marital status: Significant Other    Spouse name: N/A  . Number of children: N/A  . Years of education: N/A   Occupational History  . Not on file.  Social History Main Topics  . Smoking status: Never Smoker  . Smokeless tobacco: Never Used  . Alcohol use No  . Drug use: No  . Sexual activity: Yes    Birth control/ protection: Surgical   Other Topics Concern  . Not on file   Social History Narrative  . No narrative on file    FAMILY HISTORY: Family History  Problem Relation Age of Onset  . Diabetes type II Other   . Hypertension Other     REVIEW OF SYSTEMS: Reviewed with the patient as per History of present illness. Psych: Patient denies having dental phobia.  DENTAL HISTORY: CHIEF COMPLAINT: Patient referred by Dr. Dorris Fetch for a dental consultation.  HPI: Melva Faux 56 year old female recently diagnosed with severe mitral regurgitation and coronary artery disease. Patient with anticipated mitral valve replacement and coronary artery bypass graft procedure with Dr. Dorris Fetch. Patient is now seen as part of a medically necessary pre-heart valve surgery dental protocol examination to rule out dental infection that may affect the patient's systemic health anticipated heart valve surgery.  The patient currently denies acute toothaches, swellings, or abscesses. Patient was last seen by dentist in [place] approximately 5 years ago. Patient had a maxillary bridge placed to close multiple space between her maxillary teeth by patient report. Patient also had a lower partial denture fabricated at that time. Patient denies having any problems associated with lower partial denture. Patient denies having dental phobia.  DENTAL EXAMINATION: GENERAL: The  patient is a well-developed well-nourished female in no acute distress. HEAD AND NECK: There is no palpable neck lymphadenopathy. The patient denies acute TMJ symptoms. INTRAORAL EXAM: Patient has normal saliva. There is no evidence of oral abscess formation. DENTITION: Patient is missing tooth numbers 3, 8, 9, 16, 17, 23, 24, 25, 26, and 30. PERIODONTAL: Patient has chronic periodontitis with plaque and calculus accumulations, selective areas gingival recession and no significant tooth mobility. There is incipient to moderate bone loss noted. DENTAL CARIES/SUBOPTIMAL RESTORATIONS: No obvious dental caries are noted. ENDODONTIC: Patient currently denies acute pulpitis symptoms. I do not see any periapical pathology or radiolucencies per my review of orthopantogram. CROWN AND BRIDGE: Patient has a bridge from tooth numbers 7-10. PROSTHODONTIC: Patient has no lower partial denture that is stable and retentive. OCCLUSION: Patient has stable occlusion.  RADIOGRAPHIC INTERPRETATION: Orthopantogram was taken on 06/29/2017. There are multiple missing teeth. There is incipient to moderate bone loss. There is a bridge from tooth numbers 7-10. There are no obvious periapical radiolucencies per my review of the orthopantogram.  ASSESSMENTS: 1. Severe mitral regurgitation 2. Coronary artery disease 3. Pre-heart valve surgery dental protocol 4. Chronic periodontitis with bone loss 5. Accretions 6. Gingival recession 7. Multiple missing teeth 8. Maxillary bridge from tooth numbers 7-10. 9. Mandibular partial denture that is stable and retentive 10. Stable occlusion 7. Evaluate need for antibiotic premedication prior to invasive dental procedures due to severe mitral regurgitation and anticipated heart valve surgery.  PLAN/RECOMMENDATIONS: 1. I discussed the risks, benefits, and complications of various treatment options with the patient in relationship to her medical and dental conditions,  anticipated heart valve surgery, and risk for endocarditis.  We discussed various treatment options to include no treatment, periodontal therapy, crown and bridge therapy, implant therapy, and replacement of missing teeth as indicated. The patient currently wishes to defer any dental treatment at this time. Patient make consider obtaining a dental cleaning with a dentist in [place], [place] as time and space permits prior to anticipated heart  valve surgery. I suggested use of antibiotic premedication prior to the dental cleaning appointment if this occurs prior to the heart valve surgery. After the heart valve surgery, the patient will require antibiotic premedication prior to invasive dental procedures per American Heart Association guidelines.   2. Discussion of findings with medical team and coordination of future medical and dental care as needed.     Charlynne Pander, DDS

## 2017-06-29 NOTE — Progress Notes (Signed)
3 Days Post-Op Procedure(s) (LRB): RIGHT/LEFT HEART CATH AND CORONARY ANGIOGRAPHY (N/A) Subjective: No complaints at present. Fell last night hitting right side of head- head CT negative for intracranial process  Objective: Vital signs in last 24 hours: Temp:  [97.7 F (36.5 C)-98.4 F (36.9 C)] 98.2 F (36.8 C) (09/10 1126) Pulse Rate:  [70-76] 70 (09/10 1126) Cardiac Rhythm: Normal sinus rhythm (09/10 0700) Resp:  [18-20] 18 (09/10 1126) BP: (104-132)/(44-62) 104/58 (09/10 1126) SpO2:  [87 %-98 %] 98 % (09/10 1126) Weight:  [225 lb 9.6 oz (102.3 kg)] 225 lb 9.6 oz (102.3 kg) (09/10 0514)  Hemodynamic parameters for last 24 hours:    Intake/Output from previous day: 09/09 0701 - 09/10 0700 In: 1880 [P.O.:480] Out: 1301 [Urine:1300; Stool:1] Intake/Output this shift: Total I/O In: 240 [P.O.:240] Out: -   General appearance: alert, cooperative and no distress Neurologic: intact Heart: regular rate and rhythm  Lab Results:  Recent Labs  06/28/17 0441 06/29/17 0334  WBC 7.9 9.3  HGB 7.8* 8.4*  HCT 25.6* 28.4*  PLT 250 290   BMET:  Recent Labs  06/28/17 0906 06/29/17 0334  NA 142 142  K 4.6 5.1  CL 110 110  CO2 28 25  GLUCOSE 179* 140*  BUN 37* 33*  CREATININE 1.18* 0.96  CALCIUM 8.5* 8.2*    PT/INR: No results for input(s): LABPROT, INR in the last 72 hours. ABG    Component Value Date/Time   PHART 7.336 (L) 06/26/2017 1429   HCO3 26.4 06/26/2017 1434   TCO2 28 06/26/2017 1434   ACIDBASEDEF 1.0 06/26/2017 1429   O2SAT 50.0 06/26/2017 1434   CBG (last 3)   Recent Labs  06/29/17 0125 06/29/17 0736 06/29/17 1117  GLUCAP 167* 115* 150*    Assessment/Plan: S/P Procedure(s) (LRB): RIGHT/LEFT HEART CATH AND CORONARY ANGIOGRAPHY (N/A) -CAD and MR- will need CABG+ mitral repair or replacement She will see her dentist at home and then we will schedule her for surgery I will have my office call her to schedule follow up  Viviann Spare C. Dorris Fetch,  MD [facility] [phone]    LOS: 5 days    Loreli Slot 06/29/2017

## 2017-06-29 NOTE — Progress Notes (Signed)
Patient found unconscience on floor at 0100. Patient not aware of how she got on floor. 5cm diameter hematoma to right temple area of head. Patient had no other complaints of pain. VSS. Patient placed on 2L O2 nasal cannula. Patient last seen at about 2345 by nurse tech who was re-attaching telemetry leads. Patient was in bed at that time.  Telemetry reviewed and no abnormalities found. CT of head ordered by Audrea Muscat NP. Patient request that husband not be notified.

## 2017-06-29 NOTE — Discharge Summary (Signed)
Discharge Summary  Jill [name] ZOX:[id number] DOB: 1961/06/30  PCP: Randell Patient [facility]  Admit date: 06/18/2017 Discharge date: 06/29/2017  Time spent: >42mins. More than 50 % time spent on coordination of care  Recommendations for Outpatient Follow-up:  1. F/u with PMD within a week  for hospital discharge follow up, repeat cbc/bmp at follow up 2. F/u with cardiology for heart failure management 3. F/u with cardiothoracic surgery for elelctive CABG/mitral valve repair 4. F/u with local dentist  Discharge Diagnoses:  Active Hospital Problems   Diagnosis Date Noted  . Dyspnea   . Coronary artery disease   . Severe mitral insufficiency   . Atrial flutter (HCC)   . Mitral regurgitation 06/24/2017  . Cardiomyopathy (HCC) 06/20/2017  . Elevated troponin 06/19/2017  . CKD (chronic kidney disease) stage 3, GFR 30-59 ml/min 06/19/2017  . Pressure injury of skin 06/19/2017  . Hyperkalemia 06/18/2017  . Type 2 diabetes mellitus, uncontrolled, with neuropathy (HCC) 06/18/2017  . Hyperlipidemia 06/18/2017  . Chest pain 06/18/2017  . Essential hypertension 03/05/2016    Resolved Hospital Problems   Diagnosis Date Noted Date Resolved  No resolved problems to display.    Discharge Condition: stable  Diet recommendation: heart healthy/carb modified  Filed Weights   06/27/17 0557 06/28/17 0533 06/29/17 0514  Weight: 102.6 kg (226 lb 1.6 oz) 102.7 kg (226 lb 6.4 oz) 102.3 kg (225 lb 9.6 oz)    History of present illness:  PCP: [facility], [facility]   Patient coming from: Home  Chief Complaint: elevated potassium  HPI:  Jill [name] is a 56 y.o. female with medical history of essential hypertension, diabetes mellitus, hyperlipidemia, diabetic peripheral neuropathy presented after instructed by the [facility] health Department to present to the emergency department secondary to elevated potassium. The patient was seen at the health department on  06/17/2017 secondary to diabetic foot ulcer. Apparently, routine blood work was obtained. The patient was contacted on the morning of 06/18/2017 to report to the emergency department secondary to elevated potassium. Upon presentation, The patient was noted to have a potassium  >7.5.  Apparently, there was some concern for hemolysis of the sample. Repeat serum potassium was obtained and again was  >7.5.  Again, there was some concern in the emergency department that this may have been spurious. As a result, the potassium was checked a second time with I-STAT device and it was 7.6. As a result, admission was requested.   The patient denies any fevers, chills, nausea, vomiting, diarrhea, abdominal pain, headache, dizziness, palpitations. The patient states that she has been eating a lot of fruits including grapes, peaches, strawberries, and tomatoes. However, the patient denies using any salt substitutes or taking any over-the-counter supplements. She denies any new medications other than the cephalexin that was started on 06/17/2017. The patient states that she has been taking ibuprofen 800 mg, 1 tablet daily for the past 3-4 weeks as well as when necessary for joint pains.  During my evaluation, the patient also mentioned that she's been having some "chest burning" for the past several months when she has been taking walks with her family. She has also been expressing some dyspnea on exertion.  In the emergency department, the patient was afebrile and hemodynamically stable saturating 96-97 percent on room air. At the time of my evaluation, temporizing measures have not yet been provided for her hyperkalemia. EKG shows sinus rhythm with T-wave inversion in V4-V6, III, aVF.  Serum creatinine was 1.47 with unremarkable LFTs. CBC was  essentially unremarkable except for hemoglobin 9.5.   Hospital Course:  Active Problems:   Essential hypertension   Hyperkalemia   Type 2 diabetes mellitus, uncontrolled, with  neuropathy (HCC)   Hyperlipidemia   Chest pain   Elevated troponin   CKD (chronic kidney disease) stage 3, GFR 30-59 ml/min   Pressure injury of skin   Cardiomyopathy (HCC)   Mitral regurgitation   Atrial flutter (HCC)   Dyspnea   Coronary artery disease   Severe mitral insufficiency  Hyperkalemia (presenting symptom) -she was seen at the health department for diabetic foot ulcers, routine work up showed hyperkalemia, she is referred to [facility].  -Likely multifactorial including medications (lisinopril, ibuprofen), AKI, exogenous intake from food,  RTA type 4 and relative insulin deficiency -she received kayexalate/calciun gluconate/bicarb/albuterol ---lisinopril /ibuprofen discontinued  - K is improved -consult dietician to help with education for avoiding K rich foods -pmd to continue monitor bmp at hospital discharge follow up.  CAD/ Chest pain, Cardiomyopathy--newly diagnosed/pulmonary hypertension --06/19/17 Echo--EF 45-at the percent, grade 2 DD, moderate to severe MR, PASP 62 -may be due to mitral regurg vs ischemic -TEE on 9/6, and cardiac cath on 9/7 -she is currently on asa/statin/betablocker -elective CABG_mitral repair vs replace. Patient is to follow up with cardiology and cardiothoracic surgery.  -appreciated cardiology and cardiothoracic surgery input.  Per dentist Dr Kristin Bruins:  "The patient currently wishes to defer any dental treatment at this time. Patient make consider obtaining a dental cleaning with a dentist in [place], [place] as time and space permits prior to anticipated heart valve surgery. I suggested use of antibiotic premedication prior to the dental cleaning appointment if this occurs prior to the heart valve surgery. After the heart valve surgery, the patient will require antibiotic premedication prior to invasive dental procedures per American Heart Association guidelines.   acute on chronic combined chf /acute on chronic  hypoxic respiratory failure  - cxr on 9/1 concerns for mild chf "with acute moderate diffuse interstitial pulmonary edema which is new since 2 days ago" -she does has bilateral lower extremity pitting edema (R>L) on exam today,  -Venous doppler negative for DVT on right lower extremity and left lower extremity -she remain oxygen dependent, she has a fall the night of 9/9-9/10, cta negative for PE, + bilateral pleural effusion -she remain oxygen dependent , she is discharged on home o2,  Lasix, lopressor,  -outpatient  cardiology following   AKI on CKD stage II --Discontinue lisinopril, ibuprofen  -CPK 98 -Renal ultrasound--neg for hydronephrosis -Cr peaked at 1.47, cr normalized 0.9 at discharge, ua was not done on admission -pmd to monitor bmp at dischage  Hyperlipidemia - fasting lipid ldl 58, hdl 33 -Continue statin  Insulin dependent iabetes mellitus type 2 with neuropathy -she has been on insulin , GLP1 agonists injection and  Oral meds with dpp4inhibitor/metformin -Hemoglobin A1c--10.4 -Continue gabapentin -she has been on insulin here, diabetes education - resume home meds at discharge.    Diabetic foot ulcers -Do not appear to be infected on clinical exam -wound care consult appreciate -d/c cephlexin -outpatient follow up    Code Status: full  Family Communication: patient and family at bedside  Disposition Plan: d/c home with cardiology /cardiothoracic surgery /dentist clearance, home o2 arranged   Consultants:  Cardiology  Cardiothoracic surgery  Dentist  Wound care  Procedures:  TEE on 9/6    cardiac cath on 9/7  Antibiotics:  none  Discharge Exam: BP (!) 104/58 (BP Location: Left Arm)  Pulse 70   Temp 98.2 F (36.8 C) (Oral)   Resp 18   Ht  (1.6 m)   Wt 102.3 kg (225 lb 9.6 oz)   SpO2 98%   BMI 39.96 kg/m   General: NAD Cardiovascular: RRR Respiratory: diminished at basis Extremity: bilateral pitting  edema has improved  Discharge Instructions You were cared for by a hospitalist during your hospital stay. If you have any questions about your discharge medications or the care you received while you were in the hospital after you are discharged, you can call the unit and asked to speak with the hospitalist on call if the hospitalist that took care of you is not available. Once you are discharged, your primary care physician will handle any further medical issues. Please note that NO REFILLS for any discharge medications will be authorized once you are discharged, as it is imperative that you return to your primary care physician (or establish a relationship with a primary care physician if you do not have one) for your aftercare needs so that they can reassess your need for medications and monitor your lab values.  Discharge Instructions    Diet - low sodium heart healthy    Complete by:  As directed    Carb modified   For home use only DME oxygen    Complete by:  As directed    Mode or (Route):  Nasal cannula   Liters per Minute:  2   Frequency:  Continuous (stationary and portable oxygen unit needed)   Oxygen conserving device:  Yes   Oxygen delivery system:  Gas   Increase activity slowly    Complete by:  As directed      Allergies as of 06/29/2017   No Known Allergies     Medication List    STOP taking these medications   atorvastatin 40 MG tablet Commonly known as:  LIPITOR   cephALEXin 500 MG capsule Commonly known as:  KEFLEX   hydrochlorothiazide 25 MG tablet Commonly known as:  HYDRODIURIL   ibuprofen 800 MG tablet Commonly known as:  ADVIL,MOTRIN   lisinopril 5 MG tablet Commonly known as:  PRINIVIL,ZESTRIL     TAKE these medications   aspirin EC 81 MG tablet Take 81 mg by mouth daily.   benzonatate 100 MG capsule Commonly known as:  TESSALON Take by mouth 3 (three) times daily as needed for cough.   ferrous sulfate 325 (65 FE) MG EC tablet Take 325 mg by  mouth daily with breakfast.   furosemide 40 MG tablet Commonly known as:  LASIX Take 1 tablet (40 mg total) by mouth daily.   gabapentin 400 MG capsule Commonly known as:  NEURONTIN Take 400 mg by mouth 3 (three) times daily.   insulin detemir 100 UNIT/ML injection Commonly known as:  LEVEMIR Inject 25 Units into the skin 2 (two) times daily.   loratadine 10 MG tablet Commonly known as:  CLARITIN Take 10 mg by mouth daily.   metoprolol succinate 25 MG 24 hr tablet Commonly known as:  TOPROL-XL Take 0.5 tablets (12.5 mg total) by mouth daily.   rosuvastatin 20 MG tablet Commonly known as:  CRESTOR Take 20 mg by mouth daily.   senna-docusate 8.6-50 MG tablet Commonly known as:  Senokot-S Take 1 tablet by mouth at bedtime.   sitaGLIPtin-metformin 50-1000 MG tablet Commonly known as:  JANUMET Take 1 tablet by mouth 2 (two) times daily with a meal.   TRULICITY 0.75 MG/0.5ML Sopn Generic drug:  Dulaglutide Inject 0.75 mg into the skin once a week.   vitamin C 500 MG tablet Commonly known as:  ASCORBIC ACID Take 500 mg by mouth daily.            Durable Medical Equipment        Start     Ordered   06/29/17 0000  For home use only DME oxygen    Question Answer Comment  Mode or (Route) Nasal cannula   Liters per Minute 2   Frequency Continuous (stationary and portable oxygen unit needed)   Oxygen conserving device Yes   Oxygen delivery system Gas      06/29/17 1615       Discharge Care Instructions        Start     Ordered   06/30/17 0000  furosemide (LASIX) 40 MG tablet  Daily     06/29/17 1615   06/30/17 0000  metoprolol succinate (TOPROL-XL) 25 MG 24 hr tablet  Daily     06/29/17 1617   06/29/17 0000  Increase activity slowly     06/29/17 1615   06/29/17 0000  Diet - low sodium heart healthy     06/29/17 1615   06/29/17 0000  For home use only DME oxygen    Question Answer Comment  Mode or (Route) Nasal cannula   Liters per Minute 2     Frequency Continuous (stationary and portable oxygen unit needed)   Oxygen conserving device Yes   Oxygen delivery system Gas      06/29/17 1615   06/29/17 0000  senna-docusate (SENOKOT-S) 8.6-50 MG tablet  Daily at bedtime     06/29/17 1616     No Known Allergies Follow-up Information    follow up with dentist Follow up.        [facility], [facility] Follow up in 1 week(s).   Why:  hospital discharge follow up, Pt please call to set up appointment Contact information: [address] [place] [address] [phone]        Loreli Slot, MD Follow up.   Specialty:  Cardiothoracic Surgery Why:  for heart surgery, office will call to schedule appt. Contact information: [address] [employer] [address] [place] [address] [phone]        [facility] Follow up on 08/04/2017.   Specialty:  Cardiology Why:  heart failure @9 :20am Contact information: [address] [place] [address] [phone]           The results of significant diagnostics from this hospitalization (including imaging, microbiology, ancillary and laboratory) are listed below for reference.    Significant Diagnostic Studies: Dg Orthopantogram  Result Date: 06/29/2017 CLINICAL DATA:  Dental evaluation prior to cardiac surgery. EXAM: ORTHOPANTOGRAM/PANORAMIC COMPARISON:  None. FINDINGS: Remaining dentition does not show any evidence of caries. No evidence of periodontal disease except for subtle lucency at the root apices of tooth 19 and tooth 28. IMPRESSION: No evidence of caries. Mild lucency at the root apices at tooth 19 and tooth 28, which could represent early radicular cyst formation. Electronically Signed   By: Paulina Fusi M.D.   On: 06/29/2017 09:51   Dg Chest 2 View  Result Date: 06/20/2017 CLINICAL DATA:  Acute onset of shortness of breath and hypoxia earlier today. Hyperkalemia. Current history of hypertension, diabetes, hyperlipidemia. EXAM:  CHEST  2 VIEW COMPARISON:  06/18/2017, 03/06/2016 and 03/05/2016. CTA chest 03/05/2016. FINDINGS: Suboptimal inspiration. Cardiac silhouette mildly enlarged, unchanged. Hilar and mediastinal  contours otherwise unremarkable. Moderate diffuse interstitial pulmonary edema, new since the examination 2 days ago. Small bilateral pleural effusions, unchanged. No confluent airspace consolidation. Degenerative changes involving the thoracic and upper lumbar spine. IMPRESSION: 1. Mild CHF and/or fluid overload, with acute moderate diffuse interstitial pulmonary edema which is new since 2 days ago. 2. Stable small bilateral pleural effusions. Electronically Signed   By: Hulan Saas M.D.   On: 06/20/2017 13:51   Dg Chest 2 View  Result Date: 06/18/2017 CLINICAL DATA:  Shortness of breath EXAM: CHEST  2 VIEW COMPARISON:  03/06/2016 FINDINGS: Low lung volumes. Tiny pleural effusion. Borderline cardiomegaly. Mild perihilar interstitial opacity. No pneumothorax. IMPRESSION: 1. Tiny pleural effusions 2. Borderline cardiomegaly Electronically Signed   By: Jasmine Pang M.D.   On: 06/18/2017 23:26   Dg Os Calcis Right  Result Date: 06/04/2017 CLINICAL DATA:  Diabetic foot ulcer on heel. EXAM: RIGHT OS CALCIS - 2+ VIEW COMPARISON:  None. FINDINGS: There is no evidence of fracture or other focal bone lesions. Vascular calcifications are noted. No radiopaque foreign body is noted. No definite evidence of large ulceration is noted. IMPRESSION: No definite lytic destruction is seen in the visualized bone to suggest osteomyelitis. Electronically Signed   By: Lupita Raider, M.D.   On: 06/04/2017 09:48   Ct Head Wo Contrast  Result Date: 06/29/2017 CLINICAL DATA:  Altered level of consciousness. Fell and hit head. History of hypertension, hyperlipidemia and diabetes. EXAM: CT HEAD WITHOUT CONTRAST TECHNIQUE: Contiguous axial images were obtained from the base of the skull through the vertex without intravenous contrast.  COMPARISON:  None. FINDINGS: BRAIN: No intraparenchymal hemorrhage, mass effect nor midline shift. The ventricles and sulci are normal. No acute large vascular territory infarcts. No abnormal extra-axial fluid collections. RIGHT parafalcine punctate or possibly vascular calcification. Basal cisterns are patent. VASCULAR: Mild calcific atherosclerosis carotid siphon. SKULL/SOFT TISSUES: No skull fracture. Moderate RIGHT frontal scalp hematoma without subcutaneous gas or radiopaque foreign bodies. ORBITS/SINUSES: The included ocular globes and orbital contents are normal.Mild paranasal sinus mucosal thickening without air-fluid levels. Mastoid air cells are well aerated. OTHER: None. IMPRESSION: 1. No acute intracranial process. RIGHT frontal scalp hematoma without skull fracture. 2. Otherwise negative noncontrast CT HEAD for age. Electronically Signed   By: Awilda Metro M.D.   On: 06/29/2017 03:36   Ct Angio Chest Pe W Or Wo Contrast  Result Date: 06/29/2017 CLINICAL DATA:  Chest pain, hypoxemia EXAM: CT ANGIOGRAPHY CHEST WITH CONTRAST TECHNIQUE: Multidetector CT imaging of the chest was performed using the standard protocol during bolus administration of intravenous contrast. Multiplanar CT image reconstructions and MIPs were obtained to evaluate the vascular anatomy. CONTRAST:  100 cc Isovue 370 IV COMPARISON:  Chest CT 03/05/2016 FINDINGS: Cardiovascular: Heart is normal size. Aorta is normal caliber. No filling defects in the pulmonary arteries to suggest pulmonary emboli. Mediastinum/Nodes: Prevascular adenopathy, 15 mm in short axis diameter. AP window lymph node has a short axis diameter of 13 mm. Mildly prominent bilateral hilar lymph nodes. No axillary adenopathy. Lungs/Pleura: Moderate bilateral pleural effusions. Compressive atelectasis in the lower lobes. Ground-glass opacities throughout both lungs could reflect edema. Less likely infection. Upper Abdomen: Imaging into the upper abdomen shows no  acute findings. Musculoskeletal: Chest wall soft tissues are unremarkable. No acute bony abnormality. Review of the MIP images confirms the above findings. IMPRESSION: 1. No evidence of pulmonary embolus. 2. Moderate bilateral pleural effusions with compressive atelectasis in the lower lobes. 3. Bilateral ground-glass airspace opacities most likely edema, less  likely infection. 4. Mild mediastinal adenopathy, likely reactive. Electronically Signed   By: Charlett Nose M.D.   On: 06/29/2017 15:40   US Renal  Result Date: 06/19/2017 CLINICAL DATA:  Acute kidney injury, history hypertension, type II diabetes mellitus EXAM: RENAL / URINARY TRACT ULTRASOUND COMPLETE COMPARISON:  None FINDINGS: Right Kidney: Length: 11.5 cm. Normal cortical thickness and echogenicity. No mass, hydronephrosis or shadowing calcification. Left Kidney: Length: 10.6 cm. Mildly lobulated renal contour, which can be a normal variant. Normal cortical thickness and echogenicity. No mass, hydronephrosis or shadowing calcification. Bladder: Only partially distended, grossly unremarkable. Ureteral jets were not visualized during the period of imaging. IMPRESSION: No renal sonographic abnormalities identified. Electronically Signed   By: Ulyses Southward M.D.   On: 06/19/2017 10:56   US Venous Img Lower Unilateral Right  Result Date: 06/22/2017 CLINICAL DATA:  Right lower extremity edema and chronic diabetic ulceration. Color changes. EXAM: Right LOWER EXTREMITY VENOUS DOPPLER ULTRASOUND TECHNIQUE: Gray-scale sonography with graded compression, as well as color Doppler and duplex ultrasound were performed to evaluate the lower extremity deep venous systems from the level of the common femoral vein and including the common femoral, femoral, profunda femoral, popliteal and calf veins including the posterior tibial, peroneal and gastrocnemius veins when visible. The superficial great saphenous vein was also interrogated. Spectral Doppler was utilized to  evaluate flow at rest and with distal augmentation maneuvers in the common femoral, femoral and popliteal veins. COMPARISON:  None. FINDINGS: Contralateral Common Femoral Vein: Respiratory phasicity is normal and symmetric with the symptomatic side. No evidence of thrombus. Normal compressibility. Common Femoral Vein: No evidence of thrombus. Normal compressibility, respiratory phasicity and response to augmentation. Saphenofemoral Junction: No evidence of thrombus. Normal compressibility and flow on color Doppler imaging. Profunda Femoral Vein: No evidence of thrombus. Normal compressibility and flow on color Doppler imaging. Femoral Vein: No evidence of thrombus. Normal compressibility, respiratory phasicity and response to augmentation. Popliteal Vein: No evidence of thrombus. Normal compressibility, respiratory phasicity and response to augmentation. Calf Veins: No evidence of thrombus. Normal compressibility and flow on color Doppler imaging. Superficial Great Saphenous Vein: No evidence of thrombus. Normal compressibility and flow on color Doppler imaging. Venous Reflux:  None. Other Findings:  None. IMPRESSION: No evidence of DVT within the right lower extremity. Electronically Signed   By: Gaylyn Rong M.D.   On: 06/22/2017 11:15    Microbiology: No results found for this or any previous visit (from the past 240 hour(s)).   Labs: Basic Metabolic Panel:  Recent Labs Lab 06/26/17 0417 06/27/17 0401 06/28/17 0441 06/28/17 0906 06/29/17 0334  NA 141 139 142 142 142  K 5.0 5.2* 5.0 4.6 5.1  CL 109 110 111 110 110  CO2 26 22 26 28 25   GLUCOSE 191* 207* 175* 179* 140*  BUN 29* 30* 36* 37* 33*  CREATININE 0.94 1.02* 1.13* 1.18* 0.96  CALCIUM 8.2* 8.1* 8.2* 8.5* 8.2*   Liver Function Tests:  Recent Labs Lab 06/25/17 0532  AST 34  ALT 38  ALKPHOS 75  BILITOT 0.6  PROT 6.0*  ALBUMIN 2.7*   No results for input(s): LIPASE, AMYLASE in the last 168 hours. No results for  input(s): AMMONIA in the last 168 hours. CBC:  Recent Labs Lab 06/26/17 0417 06/27/17 0401 06/28/17 0441 06/29/17 0334  WBC 8.3 7.7 7.9 9.3  HGB 8.5* 8.3* 7.8* 8.4*  HCT 27.1* 27.8* 25.6* 28.4*  MCV 84.4 84.5 84.2 84.5  PLT 273 255 250 290   Cardiac Enzymes:  No results for input(s): CKTOTAL, CKMB, CKMBINDEX, TROPONINI in the last 168 hours. BNP: BNP (last 3 results) No results for input(s): BNP in the last 8760 hours.  ProBNP (last 3 results) No results for input(s): PROBNP in the last 8760 hours.  CBG:  Recent Labs Lab 06/28/17 1619 06/28/17 2107 06/29/17 0125 06/29/17 0736 06/29/17 1117  GLUCAP 163* 216* 167* 115* 150*       Signed:  Matisha Termine MD, PhD  [facility] 06/29/2017, 10:32 PM

## 2017-07-03 ENCOUNTER — Telehealth (HOSPITAL_COMMUNITY): Payer: Self-pay

## 2017-07-03 NOTE — Telephone Encounter (Signed)
07/03/17  pt doesn't have insurance and will need the phone number to set up payment arrangements.  she said that paperwork was strated to get her help to pay her bills.  MM

## 2017-07-06 ENCOUNTER — Ambulatory Visit (HOSPITAL_COMMUNITY): Payer: Medicaid Other | Attending: *Deleted | Admitting: Physical Therapy

## 2017-07-06 DIAGNOSIS — L97513 Non-pressure chronic ulcer of other part of right foot with necrosis of muscle: Secondary | ICD-10-CM | POA: Insufficient documentation

## 2017-07-06 DIAGNOSIS — E11621 Type 2 diabetes mellitus with foot ulcer: Secondary | ICD-10-CM | POA: Insufficient documentation

## 2017-07-06 DIAGNOSIS — L97413 Non-pressure chronic ulcer of right heel and midfoot with necrosis of muscle: Secondary | ICD-10-CM | POA: Insufficient documentation

## 2017-07-06 NOTE — Therapy (Signed)
[facility] [address] [place], [place], [address] Phone: [phone]   Fax:  [phone]  Wound Care Evaluation  Patient Details  Name: Jill [name] MRN: [id number] Date of Birth: 11-Mar-1961 Referring Provider:  Kizzie Furnish  Encounter Date: 07/06/2017      PT End of Session - 07/06/17 1615    Visit Number 1   Number of Visits 8   Date for PT Re-Evaluation 08/05/17   Authorization Type none   Authorization - Visit Number 1   Authorization - Number of Visits 8   PT Start Time 1300   PT Stop Time 1345   PT Time Calculation (min) 45 min   Activity Tolerance Patient tolerated treatment well   Behavior During Therapy [facility] for tasks assessed/performed      Past Medical History:  Diagnosis Date  . Diabetic neuropathy (HCC)   . Hyperlipidemia   . Hypertension   . Type 2 diabetes mellitus (HCC)     Past Surgical History:  Procedure Laterality Date  . RIGHT/LEFT HEART CATH AND CORONARY ANGIOGRAPHY N/A 06/26/2017   Procedure: RIGHT/LEFT HEART CATH AND CORONARY ANGIOGRAPHY;  Surgeon: Marykay Lex, MD;  Location: [facility] INVASIVE CV LAB;  Service: Cardiovascular;  Laterality: N/A;  . TEE WITHOUT CARDIOVERSION N/A 06/25/2017   Procedure: TRANSESOPHAGEAL ECHOCARDIOGRAM (TEE);  Surgeon: Thurmon Fair, MD;  Location: [facility] ENDOSCOPY;  Service: Cardiovascular;  Laterality: N/A;  . TUBAL LIGATION      There were no vitals filed for this visit.        [facility] PT Assessment - 07/06/17 0001      Assessment   Medical Diagnosis nonhealing wound    Referring Provider  Rochelle Muse   Onset Date/Surgical Date 04/13/17   Next MD Visit unknown    Prior Therapy antibiotic, self care      Precautions   Precautions --  infection      Restrictions   Weight Bearing Restrictions No     Balance Screen   Has the patient fallen in the past 6 months No   Has the patient had a decrease in activity level because of a fear of falling?  Yes   Is  the patient reluctant to leave their home because of a fear of falling?  No         Wound Therapy - 07/06/17 1554    Subjective Pt states that she has had the wound on her right heel for about three months.  She was wearing flip flops and did not realize that a wound had been made in between her great and second toe until she went to the MD on 06/17/2017.  She has recently been hospitalized for a bilateral heart cath and angiography.    Patient and Family Stated Goals wounds to heal    Date of Onset 04/13/17   Pain Assessment No/denies pain   Evaluation and Treatment Procedures Explained to Patient/Family Yes   Evaluation and Treatment Procedures agreed to   Pressure Injury Properties Date First Assessed: 07/06/17 Time First Assessed: 1318 Location: Heel Location Orientation: Right Present on Admission: Yes   Dressing Type Gauze (Comment)   Dressing Changed   Dressing Change Frequency PRN   State of Healing Other (Comment)  no granulation until debridement.    Site / Wound Assessment Yellow   % Wound base Red or Granulating 50%  after debridement    % Wound base Yellow/Fibrinous Exudate 50%   Peri-wound Assessment Maceration   Wound Length (  cm) 1.5 cm   Wound Width (cm) 1.5 cm   Wound Depth (cm) 0.3 cm   Wound Surface Area (cm^2) 2.25 cm^2   Wound Volume (cm^3) 0.68 cm^3   Margins --  heel tissue feels mushy    Drainage Amount Moderate   Treatment Cleansed;Debridement (Selective)   Wound Properties Date First Assessed: 07/06/17 Time First Assessed: 1320 Wound Type: Diabetic ulcer Location: Foot Location Orientation: Right Wound Description (Comments): between 1st and second  toes Present on Admission: Yes   Dressing Type None   Dressing Changed New   Dressing Change Frequency PRN   Site / Wound Assessment Dusky   % Wound base Red or Granulating 0%   % Wound base Yellow/Fibrinous Exudate 100%   Wound Length (cm) 3.2 cm   Wound Width (cm) 0.3 cm   Wound Depth (cm) 0.3 cm    Wound Volume (cm^3) 0.29 cm^3   Wound Surface Area (cm^2) 0.96 cm^2   Margins Epibole (rolled edges)   Treatment Cleansed;Debridement (Selective)   Selective Debridement - Location wound bed of both wounds    Selective Debridement - Tools Used Forceps;Scalpel;Scissors   Selective Debridement - Tissue Removed devitalized tissue, slough , epiboled edges    Wound Therapy - Clinical Statement Ms. Blomquist is a 56 yo female who has a history of CAD, DM and non-healing wounds on her Rt LE.  She has been referred to skilled physical therapy and would benefit from skilled physical therapy to create a healing enviornment for her non-healing wounds. She has noted maceration on her heel and between her toes.  Pt had been using silvadent to her wounds therapist request pt to stop dressing the wounds at home unless the dressings we place on her become soiled, wet, or painful.     Wound Therapy - Functional Problem List difficulty walking    Factors Delaying/Impairing Wound Healing Altered sensation;Diabetes Mellitus;Multiple medical problems;Polypharmacy;Vascular compromise   Hydrotherapy Plan Debridement;Dressing change;Patient/family education   Wound Therapy - Frequency --  2  x a week for 4 weeks    Wound Therapy - Current Recommendations PT   Wound Plan as above    Dressing  hydrogel on 2x2 to wound between toes; heel wound silverhydrofiber both  followed by dry 2x2 and dressed with 3" kling followed by netting to keep wounds in place.            Objective measurements completed on examination: See above findings.                PT Education - 07/06/17 1614    Education provided Yes   Education Details Keep dressing on unless they become soiled, wet, or painful.    Person(s) Educated Patient   Methods Explanation   Comprehension Verbalized understanding;Returned demonstration          PT Short Term Goals - 07/06/17 1618      PT SHORT TERM GOAL #1   Title Wounds to be 100%  granulated to prevent infection    Time 2   Period Weeks   Status New   Target Date 07/20/17     PT SHORT TERM GOAL #2   Title PT to verbalize the importance of visually checking her feet everyday for any wound    Time 2   Period Weeks   Status New           PT Long Term Goals - 07/06/17 1619      PT LONG TERM GOAL #1   Title  Wound in between pt great and first toe to be no greater than 1.5 x .2 x.1 cm to allow pt to be comfortable in self care.    Time 4   Period Weeks   Status New   Target Date 08/03/17     PT LONG TERM GOAL #2   Title Pt heel wound to be no greater than .5 x .5 x 0 to allow pt to be comfortable in self care.    Time 4   Period Weeks   Status New              Plan - 07/06/17 1616    Clinical Impression Statement see above   History and Personal Factors relevant to plan of care: CAD, DM,    Clinical Presentation Stable   Clinical Decision Making Moderate   Rehab Potential Good   PT Frequency 2x / week   PT Duration 4 weeks   PT Treatment/Interventions --  cleansing, debridement and dressing change    PT Next Visit Plan assess how dressing affected wounds continue with debridement as needed.    Consulted and Agree with Plan of Care Patient      Patient will benefit from skilled therapeutic intervention in order to improve the following deficits and impairments:   (debridement and proper dressing change to maintain a healthy wound environment. )  Visit Diagnosis: Diabetic ulcer of other part of right foot associated with type 2 diabetes mellitus, with necrosis of muscle (HCC)  Diabetic ulcer of right heel associated with type 2 diabetes mellitus, with necrosis of muscle (HCC)    Problem List Patient Active Problem List   Diagnosis Date Noted  . Dyspnea   . Coronary artery disease   . Severe mitral insufficiency   . Atrial flutter (HCC)   . Mitral regurgitation 06/24/2017  . Cardiomyopathy (HCC) 06/20/2017  . Elevated troponin  06/19/2017  . CKD (chronic kidney disease) stage 3, GFR 30-59 ml/min 06/19/2017  . Pressure injury of skin 06/19/2017  . Hyperkalemia 06/18/2017  . Type 2 diabetes mellitus, uncontrolled, with neuropathy (HCC) 06/18/2017  . Hyperlipidemia 06/18/2017  . Chest pain 06/18/2017  . Bronchopneumonia 03/06/2016  . CAP (community acquired pneumonia) 03/05/2016  . Insulin dependent diabetes mellitus (HCC) 03/05/2016  . Essential hypertension 03/05/2016  . Hyponatremia 03/05/2016  . Normocytic anemia 03/05/2016  . Hyperglycemia 03/05/2016  . Dehydration 03/05/2016  . Hypoxia 03/05/2016  . Leukocytosis 03/05/2016  . Acute respiratory failure with hypoxia [facility]) 03/05/2016  Virgina Organ, PT CLT [phone] 07/06/2017, 4:25 PM  [facility] [address] [place], [place], [address] Phone: [phone]   Fax:  [phone]  Name: Laveeda Ogas MRN: [id number] Date of Birth: 08-Apr-1961

## 2017-07-08 ENCOUNTER — Ambulatory Visit (INDEPENDENT_AMBULATORY_CARE_PROVIDER_SITE_OTHER): Payer: Medicaid Other | Admitting: Thoracic Surgery (Cardiothoracic Vascular Surgery)

## 2017-07-08 VITALS — BP 97/65 | HR 80 | Resp 20 | Ht 63.0 in | Wt 225.0 lb

## 2017-07-08 DIAGNOSIS — I2510 Atherosclerotic heart disease of native coronary artery without angina pectoris: Secondary | ICD-10-CM

## 2017-07-08 DIAGNOSIS — I340 Nonrheumatic mitral (valve) insufficiency: Secondary | ICD-10-CM

## 2017-07-08 NOTE — Progress Notes (Signed)
PCP is [facility], [facility] Referring Provider is Health, [facility]*  Chief Complaint  Patient presents with  . Coronary Artery Disease    F/u from hospital consult 06/27/2017  . Mitral Regurgitation    HPI: [name] is an 56 year old woman who I saw the hospital recently regarding coronary disease and mitral regurgitation. She now returns to discuss scheduling surgery.  Jill [name] is a 56 year old woman with a past medical history significant for type 2 diabetes complicated by neuropathy and diabetic foot ulcer, hypertension, and hyperlipidemia. [facility] for her diabetic foot ulcerations. A basic metabolic panel was done and her potassium was greater than 7.5. She went to the ER the following morning and a repeat potassium was again greater than 7.5 and she was admitted to the hospital. She mentioned to the doctor that she had been having a burning sensation in her chest with exertion. An EKG showed T-wave inversions in V4 through 6, III, and aVF. An echocardiogram showed an ejection fraction of 45-50% with moderate to severe mitral regurgitation. She was transferred to [facility]. A TEE showed moderate to severe MR. The catheterization she was found to have three-vessel coronary disease.  She was seen and evaluated by the dental service and cleared for surgery.  Since discharge she continues to feel fatigued. She continues to get short of breath and a vague burning sensation in her chest with exertion. She had an episode while walking from the parking lot to the elevator in her office. She denies any orthopnea or peripheral edema. She has not had rest or nocturnal symptoms.   Past Medical History:  Diagnosis Date  . Diabetic neuropathy (HCC)   . Hyperlipidemia   . Hypertension   . Type 2 diabetes mellitus (HCC)     Past Surgical History:  Procedure Laterality Date  . RIGHT/LEFT HEART CATH AND CORONARY  ANGIOGRAPHY N/A 06/26/2017   Procedure: RIGHT/LEFT HEART CATH AND CORONARY ANGIOGRAPHY;  Surgeon: Marykay Lex, MD;  Location: [facility] INVASIVE CV LAB;  Service: Cardiovascular;  Laterality: N/A;  . TEE WITHOUT CARDIOVERSION N/A 06/25/2017   Procedure: TRANSESOPHAGEAL ECHOCARDIOGRAM (TEE);  Surgeon: Thurmon Fair, MD;  Location: [facility] ENDOSCOPY;  Service: Cardiovascular;  Laterality: N/A;  . TUBAL LIGATION      Family History  Problem Relation Age of Onset  . Diabetes type II Other   . Hypertension Other     Social History Social History  Substance Use Topics  . Smoking status: Never Smoker  . Smokeless tobacco: Never Used  . Alcohol use No    Current Outpatient Prescriptions  Medication Sig Dispense Refill  . aspirin EC 81 MG tablet Take 81 mg by mouth daily.    . benzonatate (TESSALON) 100 MG capsule Take by mouth 3 (three) times daily as needed for cough.    . Dulaglutide (TRULICITY) 0.75 MG/0.5ML SOPN Inject 0.75 mg into the skin once a week.    . ferrous sulfate 325 (65 FE) MG EC tablet Take 325 mg by mouth daily with breakfast.    . furosemide (LASIX) 40 MG tablet Take 1 tablet (40 mg total) by mouth daily. 30 tablet 0  . gabapentin (NEURONTIN) 400 MG capsule Take 400 mg by mouth 3 (three) times daily.    . insulin detemir (LEVEMIR) 100 UNIT/ML injection Inject 25 Units into the skin 2 (two) times daily.     Marland Kitchen loratadine (CLARITIN) 10 MG tablet Take 10 mg by mouth daily.    Marland Kitchen  metoprolol succinate (TOPROL-XL) 25 MG 24 hr tablet Take 0.5 tablets (12.5 mg total) by mouth daily. 60 tablet 0  . rosuvastatin (CRESTOR) 20 MG tablet Take 20 mg by mouth daily.    Marland Kitchen senna-docusate (SENOKOT-S) 8.6-50 MG tablet Take 1 tablet by mouth at bedtime. 30 tablet 0  . sitaGLIPtin-metformin (JANUMET) 50-1000 MG tablet Take 1 tablet by mouth 2 (two) times daily with a meal.    . vitamin C (ASCORBIC ACID) 500 MG tablet Take 500 mg by mouth daily.     No current facility-administered medications for  this visit.     No Known Allergies  Review of Systems  Constitutional: Positive for activity change and fatigue. Negative for chills and fever.  HENT: Negative for trouble swallowing and voice change.   Eyes: Negative for visual disturbance.  Respiratory: Positive for chest tightness and shortness of breath.   Cardiovascular: Positive for chest pain. Negative for palpitations and leg swelling.  Gastrointestinal: Negative for abdominal pain and blood in stool.  Genitourinary: Negative for difficulty urinating and dysuria.  Musculoskeletal: Negative for arthralgias and myalgias.  Neurological: Positive for numbness (Both feet). Negative for dizziness and syncope.  Hematological: Negative for adenopathy. Does not bruise/bleed easily.  All other systems reviewed and are negative.   BP 97/65   Pulse 80   Resp 20   Ht  (1.6 m)   Wt 225 lb (102.1 kg)   SpO2 97% Comment: RA  BMI 39.86 kg/m  Physical Exam  Constitutional: She is oriented to person, place, and time. No distress.  Obese  HENT:  Head: Normocephalic and atraumatic.  Mouth/Throat: No oropharyngeal exudate.  Eyes: Conjunctivae and EOM are normal. No scleral icterus.  Neck: Neck supple. No thyromegaly present.  No carotid bruits  Cardiovascular: Normal rate, regular rhythm and intact distal pulses.   Murmur (2/6 systolic murmur left lower sternal border) heard. Pulmonary/Chest: Effort normal and breath sounds normal. No respiratory distress. She has no wheezes. She has no rales.  Abdominal: Soft. She exhibits no distension. There is no tenderness.  Musculoskeletal: She exhibits no edema or deformity.  Lymphadenopathy:    She has no cervical adenopathy.  Neurological: She is alert and oriented to person, place, and time. No cranial nerve deficit. Coordination normal.  Motor 5/5 all extremities  Skin: Skin is warm and dry.  Foot ulcer healing well no erythema  Vitals reviewed.    Diagnostic Tests: Study  Conclusions  - Left ventricle: Systolic function was mildly reduced. The   estimated ejection fraction was in the range of 45% to 50%. Mild   diffuse hypokinesis with no identifiable regional variations. - Aortic valve: No evidence of vegetation. - Mitral valve: Thickening, consistent with rheumatic disease. No   evidence of vegetation. There was moderate regurgitation directed   centrally, eccentrically, and anteriorly. - Left atrium: The atrium was moderately dilated. No evidence of   thrombus in the atrial cavity or appendage. There was spontaneous   echo contrast (&quot;smoke&quot;). - Right atrium: No evidence of thrombus in the atrial cavity or   appendage. No evidence of thrombus in the atrial cavity or   appendage. - Atrial septum: No defect or patent foramen ovale was identified. - Tricuspid valve: No evidence of vegetation. There was   mild-moderate regurgitation directed centrally. - Pulmonic valve: No evidence of vegetation. No evidence of   vegetation. - Pulmonary arteries: Systolic pressure was moderately increased.   PA peak pressure: 59 mm Hg (S).  Impressions:  - Although  the MR jet is eccentric and could be underestimated by   color Doppler, there is consistent evidence for moderate severity   based on continuity equation-derived regurgitant fraction,   PISA-derived effective regurgitant orifice area and absence of   pulmonary vein systolic flow reversal. Conclusion     Prox RCA lesion, 70 %stenosed. Mid RCA lesion, 40 %stenosed. Dist RCA lesion, 40 %stenosed.  Ost 2nd Diag to 2nd Diag lesion, 90 %stenosed.  Cx-OM1 bifurcation: Prox Cx lesion, 80 %stenosed. Mid Cx lesion, 100 %stenosed. Ost 1st Mrg lesion, 70 %stenosed.  There is moderate left ventricular systolic dysfunction. The left ventricular ejection fraction is 35-45% by visual estimate.  LV end diastolic pressure is severely elevated.  There is moderate (3+) mitral regurgitation by LV Gram,  but Lage V wave on PCWP would suggest Severe MR.  There is no aortic valve stenosis.  Hemodynamic findings consistent with severe pulmonary hypertension. Mostly Secondary to MR & elevated LVEDP.   Severe 2 Vessel disease (moderate to severe RCA and one or percent mid circumflex-OM occlusion) along with severe diffusely diseased very small caliber Diag branches and mild diffuse disease throughout the LAD.  Most likely at least moderate-severe mitral regurgitation based on large V wave.  Recommend CV surgery consultation to determine the best course of action would treating the patient's mitral valve and CAD.  She will return to nursing unit for ongoing care.   Bryan Lemma, M.D., M.S.   I personally reviewed the images again. I have previously reviewed the echocardiogram images.There is moderately severe MR. In my opinion there is three-vessel coronary disease although only moderate disease in the LAD.  Impression: [name] is a 56 year old Hispanic woman with past medical history significant for type 2 diabetes complicated by diabetic neuropathy, hypertension, hyperlipidemia, and obesity. She has three-vessel coronary disease and moderately severe mitral regurgitation. She has severe disease in a diagonal branch the LAD and the circumflex. There is moderate disease in the LAD and right coronary. Treatment options include medical therapy versus surgery for coronary bypass grafting and mitral valve repair or replacement. She currently is on a good medical regimen but continues to have significant symptoms which are limiting to her. I think she will do much better in both the short and long-term with revascularization and correction of her mitral regurgitation.  I offered her the option of coronary bypass grafting and mitral valve repair or replacement. I discussed the indications, risks, benefits, and alternatives with Mrs. Glenard Haring her husband and her son. We discussed the general  nature of the procedure including the incisions to be used, the need for general anesthesia, the use of cardiopulmonary bypass, the expected hospital stay, and the overall recovery. They understand the risks include, but are not limited to death, MI, stroke, DVT, PE, bleeding, possible need for transfusion, infection, complete heart block requiring pacemaker, cardiac arrhythmias, as well as the possibility of gastrointestinal complications, renal or respiratory failure.  She understands and accepts the risks and wishes to proceed.  Plan: Coronary artery bypass grafting and mitral valve repair or replacement on Wednesday, 07/22/2017  Loreli Slot, MD [facility] [phone]

## 2017-07-09 ENCOUNTER — Other Ambulatory Visit: Payer: Self-pay | Admitting: *Deleted

## 2017-07-09 ENCOUNTER — Ambulatory Visit (HOSPITAL_COMMUNITY): Payer: Medicaid Other | Admitting: Physical Therapy

## 2017-07-09 ENCOUNTER — Telehealth (HOSPITAL_COMMUNITY): Payer: Self-pay

## 2017-07-09 DIAGNOSIS — L97513 Non-pressure chronic ulcer of other part of right foot with necrosis of muscle: Principal | ICD-10-CM

## 2017-07-09 DIAGNOSIS — L97413 Non-pressure chronic ulcer of right heel and midfoot with necrosis of muscle: Secondary | ICD-10-CM

## 2017-07-09 DIAGNOSIS — I2510 Atherosclerotic heart disease of native coronary artery without angina pectoris: Secondary | ICD-10-CM

## 2017-07-09 DIAGNOSIS — I340 Nonrheumatic mitral (valve) insufficiency: Secondary | ICD-10-CM

## 2017-07-09 DIAGNOSIS — E11621 Type 2 diabetes mellitus with foot ulcer: Secondary | ICD-10-CM | POA: Diagnosis not present

## 2017-07-09 NOTE — Telephone Encounter (Signed)
07/09/17  pt said that she would be having heart surgery on 10/4 so I cx remaining appts after that since she wouldn't be able to do therapy

## 2017-07-09 NOTE — Therapy (Signed)
[facility] [address] [place], [place], [address] Phone: [phone]   Fax:  [phone]  Wound Care Therapy  Patient Details  Name: Jill [name] MRN: [id number] Date of Birth: 07-05-1961 Referring Provider:  Kizzie Furnish  Encounter Date: 07/09/2017      PT End of Session - 07/09/17 1107    Visit Number 2   Number of Visits 8   Date for PT Re-Evaluation 08/05/17   Authorization Type none   Authorization - Visit Number 2   Authorization - Number of Visits 8   PT Start Time 1030   PT Stop Time 1100   PT Time Calculation (min) 30 min   Activity Tolerance Patient tolerated treatment well   Behavior During Therapy [facility] for tasks assessed/performed      Past Medical History:  Diagnosis Date  . Diabetic neuropathy (HCC)   . Hyperlipidemia   . Hypertension   . Type 2 diabetes mellitus (HCC)     Past Surgical History:  Procedure Laterality Date  . RIGHT/LEFT HEART CATH AND CORONARY ANGIOGRAPHY N/A 06/26/2017   Procedure: RIGHT/LEFT HEART CATH AND CORONARY ANGIOGRAPHY;  Surgeon: Marykay Lex, MD;  Location: [facility] INVASIVE CV LAB;  Service: Cardiovascular;  Laterality: N/A;  . TEE WITHOUT CARDIOVERSION N/A 06/25/2017   Procedure: TRANSESOPHAGEAL ECHOCARDIOGRAM (TEE);  Surgeon: Thurmon Fair, MD;  Location: [facility] ENDOSCOPY;  Service: Cardiovascular;  Laterality: N/A;  . TUBAL LIGATION      There were no vitals filed for this visit.                  Wound Therapy - 07/09/17 1102    Subjective Pt states she has no pain and the bandage remained intact.   Patient and Family Stated Goals wounds to heal    Date of Onset 04/13/17   Pain Assessment No/denies pain   Evaluation and Treatment Procedures Explained to Patient/Family Yes   Evaluation and Treatment Procedures agreed to   Pressure Injury Properties Date First Assessed: 07/06/17 Time First Assessed: 1318 Location: Heel Location Orientation: Right Present on  Admission: Yes   Dressing Type Silver hydrofiber;Gauze (Comment)   Dressing Changed   Dressing Change Frequency PRN   State of Healing Other (Comment)  no granulation until debridement.    Site / Wound Assessment Yellow   % Wound base Red or Granulating 75%  after debridement    % Wound base Yellow/Fibrinous Exudate 25%   Peri-wound Assessment Maceration   Margins --  heel tissue feels mushy    Drainage Amount Minimal   Drainage Description Serosanguineous   Treatment Cleansed;Debridement (Selective)   Wound Properties Date First Assessed: 07/06/17 Time First Assessed: 1320 Wound Type: Diabetic ulcer Location: Foot Location Orientation: Right Wound Description (Comments): between 1st and second  toes Present on Admission: Yes   Dressing Type Silver hydrofiber   Dressing Changed Changed   Dressing Change Frequency PRN   Site / Wound Assessment Clean;Dry   % Wound base Red or Granulating 50%   % Wound base Yellow/Fibrinous Exudate 50%   Margins Epibole (rolled edges)   Drainage Amount Minimal   Drainage Description Serous   Treatment Cleansed;Debridement (Selective)   Selective Debridement - Location wound bed of both wounds    Selective Debridement - Tools Used Forceps;Scalpel;Scissors   Selective Debridement - Tissue Removed devitalized tissue, slough , epiboled edges    Wound Therapy - Clinical Statement dressings intact with no issues.  Noted improvement in granualtion following debridment.  No  pain voiced with debridement.  continued with silverhyrdofiber and gauze.   Wound Therapy - Functional Problem List difficulty walking    Factors Delaying/Impairing Wound Healing Altered sensation;Diabetes Mellitus;Multiple medical problems;Polypharmacy;Vascular compromise   Hydrotherapy Plan Debridement;Dressing change;Patient/family education   Wound Therapy - Frequency --  2  x a week for 4 weeks    Wound Therapy - Current Recommendations PT   Wound Plan continue wound care including  debridment and appropriate dressing change.  May be ready for xeroform next session.   Dressing  silverhydrofiber both  followed by dry 2x2 and dressed with 3" kling followed by netting to keep wounds in place.                   PT Short Term Goals - 07/06/17 1618      PT SHORT TERM GOAL #1   Title Wounds to be 100% granulated to prevent infection    Time 2   Period Weeks   Status New   Target Date 07/20/17     PT SHORT TERM GOAL #2   Title PT to verbalize the importance of visually checking her feet everyday for any wound    Time 2   Period Weeks   Status New           PT Long Term Goals - 07/06/17 1619      PT LONG TERM GOAL #1   Title Wound in between pt great and first toe to be no greater than 1.5 x .2 x.1 cm to allow pt to be comfortable in self care.    Time 4   Period Weeks   Status New   Target Date 08/03/17     PT LONG TERM GOAL #2   Title Pt heel wound to be no greater than .5 x .5 x 0 to allow pt to be comfortable in self care.    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 Dyspnea   . Coronary artery disease   . Severe mitral insufficiency   . Atrial flutter (HCC)   . Mitral regurgitation 06/24/2017  . Cardiomyopathy (HCC) 06/20/2017  . Elevated troponin 06/19/2017  . CKD (chronic kidney disease) stage 3, GFR 30-59 ml/min 06/19/2017  . Pressure injury of skin 06/19/2017  . Hyperkalemia 06/18/2017  . Type 2 diabetes mellitus, uncontrolled, with neuropathy (HCC) 06/18/2017  . Hyperlipidemia  06/18/2017  . Chest pain 06/18/2017  . Bronchopneumonia 03/06/2016  . CAP (community acquired pneumonia) 03/05/2016  . Insulin dependent diabetes mellitus (HCC) 03/05/2016  . Essential hypertension 03/05/2016  . Hyponatremia 03/05/2016  . Normocytic anemia 03/05/2016  . Hyperglycemia 03/05/2016  . Dehydration 03/05/2016  . Hypoxia 03/05/2016  . Leukocytosis 03/05/2016  . Acute respiratory failure with hypoxia (HCC) 03/05/2016   Lurena Nida, PTA/CLT [phone]  Lurena Nida 07/09/2017, 11:08 AM  [facility] [address] [place], [place], [address] Phone: [phone]   Fax:  [phone]  Name: Jill [name]  MRN: [id number] Date of Birth: 11-07-1960

## 2017-07-13 ENCOUNTER — Ambulatory Visit (HOSPITAL_COMMUNITY): Payer: Medicaid Other | Admitting: Physical Therapy

## 2017-07-13 DIAGNOSIS — L97513 Non-pressure chronic ulcer of other part of right foot with necrosis of muscle: Principal | ICD-10-CM

## 2017-07-13 DIAGNOSIS — E11621 Type 2 diabetes mellitus with foot ulcer: Secondary | ICD-10-CM | POA: Diagnosis not present

## 2017-07-13 DIAGNOSIS — L97413 Non-pressure chronic ulcer of right heel and midfoot with necrosis of muscle: Secondary | ICD-10-CM

## 2017-07-13 NOTE — Therapy (Signed)
[facility] [address] [place], [place], [address] Phone: [phone]   Fax:  [phone]  Name: Jill [name] MRN: [id number] Date of Birth: April 21, 1961 Referring Provider:  Kizzie Furnish  Encounter Date: 07/13/2017      PT End of Session - 07/13/17 1632    Visit Number 3   Number of Visits 8   Date for PT Re-Evaluation 08/05/17   Authorization Type none   Authorization - Visit Number 3   Authorization - Number of Visits 8   PT Start Time 1510   PT Stop Time 1545   PT Time Calculation (min) 35 min   Activity Tolerance Patient tolerated treatment well   Behavior During Therapy [facility] for tasks assessed/performed      Past Medical History:  Diagnosis Date  . Diabetic neuropathy (HCC)   . Hyperlipidemia   . Hypertension   . Type 2 diabetes mellitus (HCC)     Past Surgical History:  Procedure Laterality Date  . RIGHT/LEFT HEART CATH AND CORONARY ANGIOGRAPHY N/A 06/26/2017   Procedure: RIGHT/LEFT HEART CATH AND CORONARY ANGIOGRAPHY;  Surgeon: Marykay Lex, MD;  Location: [facility] INVASIVE CV LAB;  Service: Cardiovascular;  Laterality: N/A;  . TEE WITHOUT CARDIOVERSION N/A 06/25/2017   Procedure: TRANSESOPHAGEAL ECHOCARDIOGRAM (TEE);  Surgeon: Thurmon Fair, MD;  Location: [facility] ENDOSCOPY;  Service: Cardiovascular;  Laterality: N/A;  . TUBAL LIGATION      There were no vitals filed for this visit.                  Wound Therapy - 07/13/17 1626    Subjective Pt states she had to remove her bandages because she got them wet in the shower.  Comes today without bandage between her great and second toe and a bandaid on her heel.   Patient and Family Stated Goals wounds to heal    Date of Onset 04/13/17   Pain Assessment No/denies pain   Evaluation and Treatment Procedures Explained to Patient/Family Yes   Evaluation and Treatment Procedures agreed to   Pressure Injury Properties Date  First Assessed: 07/06/17 Time First Assessed: 1318 Location: Heel Location Orientation: Right Present on Admission: Yes   Dressing Type --   Dressing Changed   Dressing Change Frequency PRN   State of Healing Other (Comment)  no granulation until debridement.    Site / Wound Assessment Yellow   % Wound base Red or Granulating 75%  after debridement    % Wound base Yellow/Fibrinous Exudate 25%   Peri-wound Assessment Maceration   Wound Length (cm) 1 cm   Wound Width (cm) 1.1 cm   Wound Depth (cm) 0.1 cm   Wound Surface Area (cm^2) 1.1 cm^2   Wound Volume (cm^3) 0.11 cm^3   Margins --  heel tissue feels mushy    Drainage Amount Scant   Drainage Description Serous   Treatment Cleansed;Debridement (Selective)   Wound Properties Date First Assessed: 07/06/17 Time First Assessed: 1320 Wound Type: Diabetic ulcer Location: Foot Location Orientation: Right Wound Description (Comments): between 1st and second  toes Present on Admission: Yes   Dressing Type --   Dressing Changed New   Dressing Change Frequency PRN   Site / Wound Assessment Clean;Dry   % Wound base Red or Granulating 50%   % Wound base Yellow/Fibrinous Exudate 50%   Wound Length (cm) 2 cm   Wound Width (cm) 0.2 cm   Wound Depth (cm) 0.2 cm  Wound Volume (cm^3) 0.08 cm^3   Wound Surface Area (cm^2) 0.4 cm^2   Margins Epibole (rolled edges)   Drainage Amount Scant   Drainage Description Serous   Treatment Cleansed;Debridement (Selective)   Selective Debridement - Location wound bed of both wounds    Selective Debridement - Tools Used Forceps;Scalpel;Scissors   Selective Debridement - Tissue Removed devitalized tissue, slough , epiboled edges    Wound Therapy - Clinical Statement Both wounds uncovered and dryer than usual.  Debrided epibole edges to promote approximation and changed dressing to xeroform to improve moisture in wound environment.  Secoured with medipore prior to using conform and netting.  REmeasured wounds  with overall reduction in size and depth.   Wound Therapy - Functional Problem List difficulty walking    Factors Delaying/Impairing Wound Healing Altered sensation;Diabetes Mellitus;Multiple medical problems;Polypharmacy;Vascular compromise   Hydrotherapy Plan Debridement;Dressing change;Patient/family education   Wound Therapy - Frequency --  2  x a week for 4 weeks    Wound Therapy - Current Recommendations PT   Wound Plan continue wound care including debridment and appropriate dressing change.  Monitor for changes in dressing.   Dressing  xeroform both  followed by dry 2x2 and dressed with 3" kling followed by netting to keep wounds in place.                   PT Short Term Goals - 07/06/17 1618      PT SHORT TERM GOAL #1   Title Wounds to be 100% granulated to prevent infection    Time 2   Period Weeks   Status New   Target Date 07/20/17     PT SHORT TERM GOAL #2   Title PT to verbalize the importance of visually checking her feet everyday for any wound    Time 2   Period Weeks   Status New           PT Long Term Goals - 07/06/17 1619      PT LONG TERM GOAL #1   Title Wound in between pt great and first toe to be no greater than 1.5 x .2 x.1 cm to allow pt to be comfortable in self care.    Time 4   Period Weeks   Status New   Target Date 08/03/17     PT LONG TERM GOAL #2   Title Pt heel wound to be no greater than .5 x .5 x 0 to allow pt to be comfortable in self care.    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 Dyspnea   . Coronary artery disease   . Severe mitral insufficiency   . Atrial flutter (HCC)   . Mitral regurgitation 06/24/2017  . Cardiomyopathy (HCC) 06/20/2017  . Elevated troponin 06/19/2017  . CKD (chronic kidney disease) stage 3, GFR 30-59 ml/min 06/19/2017  . Pressure injury of skin 06/19/2017  . Hyperkalemia 06/18/2017  . Type 2 diabetes mellitus, uncontrolled, with neuropathy (HCC)  06/18/2017  . Hyperlipidemia 06/18/2017  . Chest pain 06/18/2017  . Bronchopneumonia 03/06/2016  . CAP (community acquired pneumonia) 03/05/2016  . Insulin dependent diabetes mellitus (HCC) 03/05/2016  . Essential hypertension 03/05/2016  . Hyponatremia 03/05/2016  . Normocytic anemia 03/05/2016  . Hyperglycemia 03/05/2016  . Dehydration 03/05/2016  . Hypoxia 03/05/2016  . Leukocytosis 03/05/2016  . Acute respiratory failure with hypoxia (HCC) 03/05/2016   Lurena Nida, PTA/CLT [phone]  Lurena Nida 07/13/2017, 4:33 PM  [facility] [address] [place], [place], [address] Phone: [phone]   Fax:  [phone]  Name: Jill [name] MRN: [id number] Date of Birth: 01-30-1961

## 2017-07-16 ENCOUNTER — Ambulatory Visit (HOSPITAL_COMMUNITY): Payer: Medicaid Other | Admitting: Physical Therapy

## 2017-07-16 DIAGNOSIS — L97413 Non-pressure chronic ulcer of right heel and midfoot with necrosis of muscle: Secondary | ICD-10-CM

## 2017-07-16 DIAGNOSIS — E11621 Type 2 diabetes mellitus with foot ulcer: Secondary | ICD-10-CM

## 2017-07-16 DIAGNOSIS — L97513 Non-pressure chronic ulcer of other part of right foot with necrosis of muscle: Principal | ICD-10-CM

## 2017-07-16 NOTE — Therapy (Signed)
[facility] [address] [place], [place], [address] Phone: [phone]   Fax:  [phone]  Wound Care Therapy  Patient Details  Name: Jill [name] MRN: [id number] Date of Birth: 07/04/1961 Referring Provider:  Kizzie Furnish  Encounter Date: 07/16/2017      PT End of Session - 07/16/17 1700    Visit Number 4   Number of Visits 8   Date for PT Re-Evaluation 08/05/17   Authorization Type none   Authorization - Visit Number 4   Authorization - Number of Visits 8   PT Start Time 1602   PT Stop Time 1635   PT Time Calculation (min) 33 min   Activity Tolerance Patient tolerated treatment well   Behavior During Therapy [facility] for tasks assessed/performed      Past Medical History:  Diagnosis Date  . Diabetic neuropathy (HCC)   . Hyperlipidemia   . Hypertension   . Type 2 diabetes mellitus (HCC)     Past Surgical History:  Procedure Laterality Date  . RIGHT/LEFT HEART CATH AND CORONARY ANGIOGRAPHY N/A 06/26/2017   Procedure: RIGHT/LEFT HEART CATH AND CORONARY ANGIOGRAPHY;  Surgeon: Marykay Lex, MD;  Location: [facility] INVASIVE CV LAB;  Service: Cardiovascular;  Laterality: N/A;  . TEE WITHOUT CARDIOVERSION N/A 06/25/2017   Procedure: TRANSESOPHAGEAL ECHOCARDIOGRAM (TEE);  Surgeon: Thurmon Fair, MD;  Location: [facility] ENDOSCOPY;  Service: Cardiovascular;  Laterality: N/A;  . TUBAL LIGATION      There were no vitals filed for this visit.                  Wound Therapy - 07/16/17 1648    Subjective Pt states she used a [place] bag with tape over her foot when she showered and it stayed dry.  No complaints or pain today.  States she is having heart surgery next Thursday.   Patient and Family Stated Goals wounds to heal    Date of Onset 04/13/17   Pain Assessment No/denies pain   Evaluation and Treatment Procedures Explained to Patient/Family Yes   Evaluation and Treatment Procedures agreed to   Pressure Injury Properties  Date First Assessed: 07/06/17 Time First Assessed: 1318 Location: Heel Location Orientation: Right Present on Admission: Yes   Dressing Type Impregnated gauze (bismuth)   Dressing Changed   Dressing Change Frequency PRN   State of Healing Other (Comment)  no granulation until debridement.    Site / Wound Assessment Yellow   % Wound base Red or Granulating 80%  after debridement    % Wound base Yellow/Fibrinous Exudate 20%   Peri-wound Assessment Maceration   Wound Length (cm) 0.9 cm   Wound Width (cm) 1 cm   Wound Depth (cm) 0.1 cm   Wound Surface Area (cm^2) 0.9 cm^2   Wound Volume (cm^3) 0.09 cm^3   Margins --  heel tissue feels mushy    Drainage Amount Scant   Drainage Description Serous   Treatment Cleansed;Debridement (Selective)   Wound Properties Date First Assessed: 07/06/17 Time First Assessed: 1320 Wound Type: Diabetic ulcer Location: Foot Location Orientation: Right Wound Description (Comments): between 1st and second  toes Present on Admission: Yes   Dressing Changed Changed   Dressing Change Frequency PRN   Site / Wound Assessment Clean;Dry   % Wound base Red or Granulating 50%   % Wound base Yellow/Fibrinous Exudate 50%   Wound Length (cm) 1.5 cm   Wound Width (cm) 0.2 cm   Wound Depth (cm) 0.2 cm  Wound Volume (cm^3) 0.06 cm^3   Wound Surface Area (cm^2) 0.3 cm^2   Margins Epibole (rolled edges)   Drainage Amount Scant   Drainage Description Serous   Treatment Cleansed;Debridement (Selective)   Wound Properties Date First Assessed: 07/16/17 Time First Assessed: 1654 Wound Type: Other (Comment) Location: Toe (Comment  which one) Location Orientation: Left Wound Description (Comments): second toe, plantar surface Present on Admission: No   Dressing Type None   Dressing Changed New   Dressing Status None   Dressing Change Frequency PRN   Site / Wound Assessment Dry   % Wound base Red or Granulating 90%   % Wound base Yellow/Fibrinous Exudate 10%   Peri-wound  Assessment Other (Comment)  dry   Wound Length (cm) 1.5 cm   Wound Width (cm) 0.2 cm   Wound Depth (cm) 0.1 cm   Wound Volume (cm^3) 0.03 cm^3   Wound Surface Area (cm^2) 0.3 cm^2   Drainage Amount Scant   Drainage Description Serosanguineous   Treatment Cleansed;Debridement (Selective)   Selective Debridement - Location wound bed of both wounds    Selective Debridement - Tools Used Forceps;Scalpel;Scissors   Selective Debridement - Tissue Removed devitalized tissue, slough , epiboled edges    Wound Therapy - Clinical Statement Pt comes today with dressing still intact and dry.  NOted with new wound plantar surface of second toe in crease where skin is dry and has cracked open.  Encouraged patient to moisturize beneath toes as they are extremely dry.  Dressed are with xeroform and covered with medipore.  Measured other wounds with noted reduction in size and increased granlulation.    Wound Therapy - Functional Problem List difficulty walking    Factors Delaying/Impairing Wound Healing Altered sensation;Diabetes Mellitus;Multiple medical problems;Polypharmacy;Vascular compromise   Hydrotherapy Plan Debridement;Dressing change;Patient/family education   Wound Therapy - Frequency --  2  x a week for 4 weeks    Wound Therapy - Current Recommendations PT   Wound Plan continue wound care including debridment and appropriate dressing change.  Monitor for changes in dressing.   Dressing  xeroform to all wounds  followed by dry 2x2 and dressed with 3" kling followed by netting to keep wounds in place.                   PT Short Term Goals - 07/06/17 1618      PT SHORT TERM GOAL #1   Title Wounds to be 100% granulated to prevent infection    Time 2   Period Weeks   Status New   Target Date 07/20/17     PT SHORT TERM GOAL #2   Title PT to verbalize the importance of visually checking her feet everyday for any wound    Time 2   Period Weeks   Status New           PT Long  Term Goals - 07/06/17 1619      PT LONG TERM GOAL #1   Title Wound in between pt great and first toe to be no greater than 1.5 x .2 x.1 cm to allow pt to be comfortable in self care.    Time 4   Period Weeks   Status New   Target Date 08/03/17     PT LONG TERM GOAL #2   Title Pt heel wound to be no greater than .5 x .5 x 0 to allow pt to be comfortable in self care.    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 Dyspnea   . Coronary artery disease   . Severe mitral insufficiency   . Atrial flutter (HCC)   . Mitral regurgitation 06/24/2017  . Cardiomyopathy (HCC) 06/20/2017  . Elevated troponin 06/19/2017  . CKD (chronic kidney disease) stage 3, GFR 30-59 ml/min 06/19/2017  . Pressure injury of skin 06/19/2017  . Hyperkalemia 06/18/2017  . Type 2 diabetes mellitus, uncontrolled, with neuropathy (HCC) 06/18/2017  . Hyperlipidemia 06/18/2017  . Chest pain 06/18/2017  . Bronchopneumonia 03/06/2016  . CAP (community acquired pneumonia) 03/05/2016  . Insulin dependent diabetes mellitus (HCC) 03/05/2016  . Essential hypertension 03/05/2016  . Hyponatremia 03/05/2016  . Normocytic anemia 03/05/2016  . Hyperglycemia 03/05/2016  . Dehydration 03/05/2016  . Hypoxia 03/05/2016  . Leukocytosis 03/05/2016  . Acute respiratory failure with hypoxia (HCC) 03/05/2016   Lurena Nida, PTA/CLT [phone]  Lurena Nida 07/16/2017, 5:02 PM  [facility] [address] [place], [place], [address] Phone:  [phone]   Fax:  [phone]  Name: Jill [name] MRN: [id number] Date of Birth: May 29, 1961

## 2017-07-17 NOTE — Pre-Procedure Instructions (Signed)
Jill [name]  07/17/2017      [id number] - [place], [place] - [address] [name] [address] Phone: [phone] Fax: [phone]    Your procedure is scheduled on July 22, 2017.  Report to [facility] Admitting at 630 AM.  Call this number if you have problems the morning of surgery:  [phone]   Remember:  Do not eat food or drink liquids after midnight.  Take these medicines the morning of surgery with A SIP OF WATER gabapentin (neurontin), loratidine (claritin), metoprolol succinate (Toprol-XL).  7 days prior to surgery STOP taking any Aspirin, Aleve, Naproxen, Ibuprofen, Motrin, Advil, Goody's, BC's, all herbal medications, fish oil, and all vitamins   WHAT DO I DO ABOUT MY DIABETES MEDICATION?   Marland Kitchen Do not take oral diabetes medicines (pills) the morning of surgery Janumet, (sitaglipton-metformin).  . THE NIGHT BEFORE SURGERY, take 12 units of levimir insulin.      . THE MORNING OF SURGERY, take 12 units of levimir insulin.  . The day of surgery, do not take other diabetes injectables, including Byetta (exenatide), Bydureon (exenatide ER), Victoza (liraglutide), or Trulicity (dulaglutide).  . If your CBG is greater than 220 mg/dL, you may take  of your sliding scale (correction) dose of insulin.  Reviewed and Endorsed by [facility] Patient Education Committee, August 2015     How to Manage Your Diabetes Before and After Surgery  Why is it important to control my blood sugar before and after surgery? . Improving blood sugar levels before and after surgery helps healing and can limit problems. . A way of improving blood sugar control is eating a healthy diet by: o  Eating less sugar and carbohydrates o  Increasing activity/exercise o  Talking with your doctor about reaching your blood sugar goals . High blood sugars (greater than 180 mg/dL) can raise your risk of infections and slow your recovery, so you  will need to focus on controlling your diabetes during the weeks before surgery. . Make sure that the doctor who takes care of your diabetes knows about your planned surgery including the date and location.  How do I manage my blood sugar before surgery? . Check your blood sugar at least 4 times a day, starting 2 days before surgery, to make sure that the level is not too high or low. o Check your blood sugar the morning of your surgery when you wake up and every 2 hours until you get to the Short Stay unit. . If your blood sugar is less than 70 mg/dL, you will need to treat for low blood sugar: o Do not take insulin. o Treat a low blood sugar (less than 70 mg/dL) with  cup of clear juice (cranberry or apple), 4 glucose tablets, OR glucose gel. o Recheck blood sugar in 15 minutes after treatment (to make sure it is greater than 70 mg/dL). If your blood sugar is not greater than 70 mg/dL on recheck, call [phone] for further instructions. . Report your blood sugar to the short stay nurse when you get to Short Stay.  . If you are admitted to the hospital after surgery: o Your blood sugar will be checked by the staff and you will probably be given insulin after surgery (instead of oral diabetes medicines) to make sure you have good blood sugar levels. o The goal for blood sugar control after surgery is 80-180 mg/dL.    Do not wear jewelry,  make-up or nail polish.  Do not wear lotions, powders, or perfumes, or deoderant.  Do not shave 48 hours prior to surgery.    Do not bring valuables to the hospital.  [facility] is not responsible for any belongings or valuables.  Contacts, dentures or bridgework may not be worn into surgery.  Leave your suitcase in the car.  After surgery it may be brought to your room.  For patients admitted to the hospital, discharge time will be determined by your treatment team.  Patients discharged the day of surgery will not be allowed to drive home.     Special instructions:   [place]- Preparing For Surgery  Before surgery, you can play an important role. Because skin is not sterile, your skin needs to be as free of germs as possible. You can reduce the number of germs on your skin by washing with CHG (chlorahexidine gluconate) Soap before surgery.  CHG is an antiseptic cleaner which kills germs and bonds with the skin to continue killing germs even after washing.  Please do not use if you have an allergy to CHG or antibacterial soaps. If your skin becomes reddened/irritated stop using the CHG.  Do not shave (including legs and underarms) for at least 48 hours prior to first CHG shower. It is OK to shave your face.  Please follow these instructions carefully.   1. Shower the NIGHT BEFORE SURGERY and the MORNING OF SURGERY with CHG.   2. If you chose to wash your hair, wash your hair first as usual with your normal shampoo.  3. After you shampoo, rinse your hair and body thoroughly to remove the shampoo.  4. Use CHG as you would any other liquid soap. You can apply CHG directly to the skin and wash gently with a scrungie or a clean washcloth.   5. Apply the CHG Soap to your body ONLY FROM THE NECK DOWN.  Do not use on open wounds or open sores. Avoid contact with your eyes, ears, mouth and genitals (private parts). Wash genitals (private parts) with your normal soap.  6. Wash thoroughly, paying special attention to the area where your surgery will be performed.  7. Thoroughly rinse your body with warm water from the neck down.  8. DO NOT shower/wash with your normal soap after using and rinsing off the CHG Soap.  9. Pat yourself dry with a CLEAN TOWEL.   10. Wear CLEAN PAJAMAS   11. Place CLEAN SHEETS on your bed the night of your first shower and DO NOT SLEEP WITH PETS.    Day of Surgery: Do not apply any deodorants/lotions. Please wear clean clothes to the hospital/surgery center.     Please read over the following  fact sheets that you were given. Pain Booklet, Coughing and Deep Breathing, MRSA Information and Surgical Site Infection Prevention

## 2017-07-20 ENCOUNTER — Encounter (HOSPITAL_COMMUNITY): Payer: Self-pay

## 2017-07-20 ENCOUNTER — Ambulatory Visit (HOSPITAL_COMMUNITY): Payer: Self-pay | Admitting: Physical Therapy

## 2017-07-20 ENCOUNTER — Ambulatory Visit (HOSPITAL_COMMUNITY)
Admission: RE | Admit: 2017-07-20 | Discharge: 2017-07-20 | Disposition: A | Discharge status: Home / Self Care | Payer: Medicaid Other | Source: Ambulatory Visit | Attending: Thoracic Surgery (Cardiothoracic Vascular Surgery) | Admitting: Thoracic Surgery (Cardiothoracic Vascular Surgery)

## 2017-07-20 ENCOUNTER — Telehealth: Payer: Self-pay

## 2017-07-20 ENCOUNTER — Encounter (HOSPITAL_COMMUNITY)
Admission: RE | Admit: 2017-07-20 | Discharge: 2017-07-20 | Disposition: A | Discharge status: Home / Self Care | Payer: Medicaid Other | Source: Ambulatory Visit | Attending: Thoracic Surgery (Cardiothoracic Vascular Surgery) | Admitting: Thoracic Surgery (Cardiothoracic Vascular Surgery)

## 2017-07-20 DIAGNOSIS — I2510 Atherosclerotic heart disease of native coronary artery without angina pectoris: Secondary | ICD-10-CM | POA: Diagnosis not present

## 2017-07-20 DIAGNOSIS — Z01818 Encounter for other preprocedural examination: Secondary | ICD-10-CM | POA: Insufficient documentation

## 2017-07-20 DIAGNOSIS — E119 Type 2 diabetes mellitus without complications: Secondary | ICD-10-CM | POA: Insufficient documentation

## 2017-07-20 DIAGNOSIS — N390 Urinary tract infection, site not specified: Secondary | ICD-10-CM

## 2017-07-20 DIAGNOSIS — I340 Nonrheumatic mitral (valve) insufficiency: Secondary | ICD-10-CM

## 2017-07-20 DIAGNOSIS — Z01810 Encounter for preprocedural cardiovascular examination: Secondary | ICD-10-CM | POA: Diagnosis present

## 2017-07-20 HISTORY — DX: Headache: R51

## 2017-07-20 HISTORY — DX: Headache, unspecified: R51.9

## 2017-07-20 HISTORY — DX: Cardiac arrhythmia, unspecified: I49.9

## 2017-07-20 HISTORY — DX: Dyspnea, unspecified: R06.00

## 2017-07-20 LAB — PULMONARY FUNCTION TEST
DL/VA % pred: 84 %
DL/VA: 3.97 ml/min/mmHg/L
DLCO unc % pred: 60 %
DLCO unc: 13.78 ml/min/mmHg
FEF 25-75 Post: 3.68 L/sec
FEF 25-75 Pre: 3.24 L/sec
FEF2575-%Change-Post: 13 %
FEF2575-%PRED-PRE: 132 %
FEF2575-%Pred-Post: 150 %
FEV1-%Change-Post: 1 %
FEV1-%PRED-PRE: 83 %
FEV1-%Pred-Post: 85 %
FEV1-POST: 2.18 L
FEV1-PRE: 2.14 L
FEV1FVC-%Change-Post: 0 %
FEV1FVC-%Pred-Pre: 110 %
FEV6-%Change-Post: 1 %
FEV6-%PRED-PRE: 77 %
FEV6-%Pred-Post: 78 %
FEV6-POST: 2.49 L
FEV6-Pre: 2.45 L
FEV6FVC-%Change-Post: 0 %
FEV6FVC-%PRED-POST: 102 %
FEV6FVC-%PRED-PRE: 103 %
FVC-%Change-Post: 2 %
FVC-%PRED-PRE: 74 %
FVC-%Pred-Post: 76 %
FVC-POST: 2.5 L
FVC-PRE: 2.45 L
POST FEV6/FVC RATIO: 100 %
PRE FEV1/FVC RATIO: 87 %
PRE FEV6/FVC RATIO: 100 %
Post FEV1/FVC ratio: 87 %
RV % PRED: 114 %
RV: 2.14 L
TLC % PRED: 91 %
TLC: 4.46 L

## 2017-07-20 LAB — COMPREHENSIVE METABOLIC PANEL
ALBUMIN: 3.9 g/dL (ref 3.5–5.0)
ALT: 15 U/L (ref 14–54)
ANION GAP: 13 (ref 5–15)
AST: 18 U/L (ref 15–41)
Alkaline Phosphatase: 82 U/L (ref 38–126)
BUN: 42 mg/dL — AB (ref 6–20)
CHLORIDE: 100 mmol/L — AB (ref 101–111)
CO2: 24 mmol/L (ref 22–32)
Calcium: 9.2 mg/dL (ref 8.9–10.3)
Creatinine, Ser: 1.44 mg/dL — ABNORMAL HIGH (ref 0.44–1.00)
GFR calc Af Amer: 46 mL/min — ABNORMAL LOW (ref >60)
GFR calc non Af Amer: 40 mL/min — ABNORMAL LOW (ref >60)
Glucose, Bld: 195 mg/dL — ABNORMAL HIGH (ref 65–99)
POTASSIUM: 4.6 mmol/L (ref 3.5–5.1)
SODIUM: 137 mmol/L (ref 135–145)
Total Bilirubin: 0.4 mg/dL (ref 0.3–1.2)
Total Protein: 8.1 g/dL (ref 6.5–8.1)

## 2017-07-20 LAB — CBC
HCT: 37.1 % (ref 36.0–46.0)
Hemoglobin: 11.5 g/dL — ABNORMAL LOW (ref 12.0–15.0)
MCH: 25.6 pg — ABNORMAL LOW (ref 26.0–34.0)
MCHC: 31 g/dL (ref 30.0–36.0)
MCV: 82.6 fL (ref 78.0–100.0)
Platelets: 285 10*3/uL (ref 150–400)
RBC: 4.49 MIL/uL (ref 3.87–5.11)
RDW: 14.1 % (ref 11.5–15.5)
WBC: 9.4 10*3/uL (ref 4.0–10.5)

## 2017-07-20 LAB — VAS US DOPPLER PRE CABG
LCCADSYS: -97 cm/s
LCCAPDIAS: 24 cm/s
LEFT ECA DIAS: -9 cm/s
LEFT VERTEBRAL DIAS: -20 cm/s
Left CCA dist dias: -27 cm/s
Left CCA prox sys: 93 cm/s
Left ICA dist dias: -35 cm/s
Left ICA dist sys: -104 cm/s
Left ICA prox dias: -30 cm/s
Left ICA prox sys: -82 cm/s
RCCADSYS: -92 cm/s
RCCAPDIAS: -27 cm/s
RCCAPSYS: -83 cm/s
RIGHT ECA DIAS: -11 cm/s
RIGHT VERTEBRAL DIAS: -22 cm/s

## 2017-07-20 LAB — BLOOD GAS, ARTERIAL
ACID-BASE EXCESS: 2 mmol/L (ref 0.0–2.0)
Bicarbonate: 26.7 mmol/L (ref 20.0–28.0)
Drawn by: 421801
FIO2: 21
O2 SAT: 97.1 %
Patient temperature: 98.6
pCO2 arterial: 46.1 mmHg (ref 32.0–48.0)
pH, Arterial: 7.38 (ref 7.350–7.450)
pO2, Arterial: 90.6 mmHg (ref 83.0–108.0)

## 2017-07-20 LAB — HEMOGLOBIN A1C
Hgb A1c MFr Bld: 9.1 % — ABNORMAL HIGH (ref 4.8–5.6)
Mean Plasma Glucose: 214.47 mg/dL

## 2017-07-20 LAB — URINALYSIS, ROUTINE W REFLEX MICROSCOPIC
Bilirubin Urine: NEGATIVE
GLUCOSE, UA: NEGATIVE mg/dL
Hgb urine dipstick: NEGATIVE
KETONES UR: NEGATIVE mg/dL
Nitrite: NEGATIVE
PROTEIN: 30 mg/dL — AB
Specific Gravity, Urine: 1.015 (ref 1.005–1.030)
pH: 5 (ref 5.0–8.0)

## 2017-07-20 LAB — SURGICAL PCR SCREEN
MRSA, PCR: NEGATIVE
STAPHYLOCOCCUS AUREUS: NEGATIVE

## 2017-07-20 LAB — PROTIME-INR
INR: 1.07
PROTHROMBIN TIME: 13.8 s (ref 11.4–15.2)

## 2017-07-20 LAB — GLUCOSE, CAPILLARY: Glucose-Capillary: 183 mg/dL — ABNORMAL HIGH (ref 65–99)

## 2017-07-20 LAB — ABO/RH: ABO/RH(D): O NEG

## 2017-07-20 LAB — APTT: APTT: 29 s (ref 24–36)

## 2017-07-20 MED ORDER — ALBUTEROL SULFATE (2.5 MG/3ML) 0.083% IN NEBU
2.5000 mg | INHALATION_SOLUTION | Freq: Once | RESPIRATORY_TRACT | Status: AC
  Administered 2017-07-20: 2.5 mg via RESPIRATORY_TRACT

## 2017-07-20 MED ORDER — CIPROFLOXACIN HCL 500 MG PO TABS: 500.0000 mg | ORAL_TABLET | Freq: Two times a day (BID) | ORAL | 0 refills | Status: DC

## 2017-07-20 NOTE — Progress Notes (Signed)
Pre-op Cardiac Surgery  Carotid Findings:  1-39% ICA plaquing. Vertebral artery flow is antegrade.   Upper Extremity Right Left  Brachial Pressures T 110T  Radial Waveforms T T  Ulnar Waveforms T T  Palmar Arch (Allen's Test) Doppler signal obliterates with radial compression and remains normal with ulnar compression WNL   Findings:      Lower  Extremity Right Left  Dorsalis Pedis 300B 300B  Anterior Tibial    Posterior Tibial 300B 187B  Ankle/Brachial Indices Non comp Non comp  Great Toe 79 71    Findings:  ABIs not ascertained secondary to calcified vessels.  Great toe pressures are WNL.

## 2017-07-20 NOTE — Telephone Encounter (Signed)
RX for Cipro 500 mg called to pharm for UTI. Surgery scheduled for 07/22/17 with Dr Dorris Fetch

## 2017-07-21 ENCOUNTER — Ambulatory Visit (HOSPITAL_COMMUNITY): Payer: Medicaid Other | Attending: *Deleted | Admitting: Physical Therapy

## 2017-07-21 DIAGNOSIS — L97413 Non-pressure chronic ulcer of right heel and midfoot with necrosis of muscle: Secondary | ICD-10-CM | POA: Insufficient documentation

## 2017-07-21 DIAGNOSIS — L97513 Non-pressure chronic ulcer of other part of right foot with necrosis of muscle: Secondary | ICD-10-CM | POA: Diagnosis present

## 2017-07-21 DIAGNOSIS — E11621 Type 2 diabetes mellitus with foot ulcer: Secondary | ICD-10-CM | POA: Insufficient documentation

## 2017-07-21 MED ORDER — TRANEXAMIC ACID 1000 MG/10ML IV SOLN
1.5000 mg/kg/h | INTRAVENOUS | Status: AC
  Administered 2017-07-22: 1.5 mg/kg/h via INTRAVENOUS
  Filled 2017-07-21: qty 25

## 2017-07-21 MED ORDER — NITROGLYCERIN IN D5W 200-5 MCG/ML-% IV SOLN
2.0000 ug/min | INTRAVENOUS | Status: AC
  Administered 2017-07-22: 16 ug/min via INTRAVENOUS
  Filled 2017-07-21: qty 250

## 2017-07-21 MED ORDER — DEXMEDETOMIDINE HCL IN NACL 400 MCG/100ML IV SOLN
0.1000 ug/kg/h | INTRAVENOUS | Status: AC
  Administered 2017-07-22: .3 ug/kg/h via INTRAVENOUS
  Filled 2017-07-21: qty 100

## 2017-07-21 MED ORDER — TRANEXAMIC ACID (OHS) BOLUS VIA INFUSION
15.0000 mg/kg | INTRAVENOUS | Status: AC
  Administered 2017-07-22: 1432.5 mg via INTRAVENOUS
  Filled 2017-07-21: qty 1433

## 2017-07-21 MED ORDER — TRANEXAMIC ACID (OHS) PUMP PRIME SOLUTION
2.0000 mg/kg | INTRAVENOUS | Status: DC
  Filled 2017-07-21: qty 1.91

## 2017-07-21 MED ORDER — SODIUM CHLORIDE 0.9 % IV SOLN
INTRAVENOUS | Status: DC
  Filled 2017-07-21 (×2): qty 30

## 2017-07-21 MED ORDER — METOPROLOL TARTRATE 12.5 MG HALF TABLET
12.5000 mg | ORAL_TABLET | Freq: Once | ORAL | Status: DC
  Filled 2017-07-21: qty 1

## 2017-07-21 MED ORDER — PHENYLEPHRINE HCL 10 MG/ML IJ SOLN
30.0000 ug/min | INTRAMUSCULAR | Status: AC
  Administered 2017-07-22: 20 ug/min via INTRAVENOUS
  Filled 2017-07-21: qty 2

## 2017-07-21 MED ORDER — SODIUM CHLORIDE 0.9 % IV SOLN
INTRAVENOUS | Status: AC
  Administered 2017-07-22: 3 [IU]/h via INTRAVENOUS
  Filled 2017-07-21: qty 1

## 2017-07-21 MED ORDER — MAGNESIUM SULFATE 50 % IJ SOLN
40.0000 meq | INTRAMUSCULAR | Status: DC
  Filled 2017-07-21: qty 10

## 2017-07-21 MED ORDER — POTASSIUM CHLORIDE 2 MEQ/ML IV SOLN
80.0000 meq | INTRAVENOUS | Status: DC
  Filled 2017-07-21: qty 40

## 2017-07-21 MED ORDER — EPINEPHRINE PF 1 MG/ML IJ SOLN
0.0000 ug/min | INTRAVENOUS | Status: DC
  Filled 2017-07-21: qty 4

## 2017-07-21 MED ORDER — CHLORHEXIDINE GLUCONATE 0.12 % MT SOLN
15.0000 mL | Freq: Once | OROMUCOSAL | Status: AC
  Administered 2017-07-22: 15 mL via OROMUCOSAL
  Filled 2017-07-21: qty 15

## 2017-07-21 MED ORDER — PLASMA-LYTE 148 IV SOLN
INTRAVENOUS | Status: AC
  Administered 2017-07-22: 500 mL
  Filled 2017-07-21: qty 2.5

## 2017-07-21 MED ORDER — DEXTROSE 5 % IV SOLN
750.0000 mg | INTRAVENOUS | Status: DC
  Filled 2017-07-21: qty 750

## 2017-07-21 MED ORDER — VANCOMYCIN HCL 10 G IV SOLR
1500.0000 mg | INTRAVENOUS | Status: AC
  Administered 2017-07-22: 1500 mg via INTRAVENOUS
  Filled 2017-07-21: qty 1500

## 2017-07-21 MED ORDER — DOPAMINE-DEXTROSE 3.2-5 MG/ML-% IV SOLN
0.0000 ug/kg/min | INTRAVENOUS | Status: AC
  Administered 2017-07-22: 3 ug/kg/min via INTRAVENOUS
  Filled 2017-07-21: qty 250

## 2017-07-21 MED ORDER — DEXTROSE 5 % IV SOLN
1.5000 g | INTRAVENOUS | Status: AC
  Administered 2017-07-22: .75 g via INTRAVENOUS
  Administered 2017-07-22: 1.5 g via INTRAVENOUS
  Filled 2017-07-21: qty 1.5

## 2017-07-21 NOTE — Therapy (Signed)
[facility] [address] [place], [place], [address] Phone: [phone]   Fax:  [phone]  Wound Care Therapy  Patient Details  Name: Jill [name] MRN: [id number] Date of Birth: 1960/12/12 Referring Provider:  Kizzie Furnish  Encounter Date: 07/21/2017    Past Medical History:  Diagnosis Date  . Diabetic neuropathy (HCC)   . Dyspnea   . Dysrhythmia   . Headache   . Hyperlipidemia   . Hypertension   . Type 2 diabetes mellitus (HCC)     Past Surgical History:  Procedure Laterality Date  . RIGHT/LEFT HEART CATH AND CORONARY ANGIOGRAPHY N/A 06/26/2017   Procedure: RIGHT/LEFT HEART CATH AND CORONARY ANGIOGRAPHY;  Surgeon: Marykay Lex, MD;  Location: [facility] INVASIVE CV LAB;  Service: Cardiovascular;  Laterality: N/A;  . TEE WITHOUT CARDIOVERSION N/A 06/25/2017   Procedure: TRANSESOPHAGEAL ECHOCARDIOGRAM (TEE);  Surgeon: Thurmon Fair, MD;  Location: [facility] ENDOSCOPY;  Service: Cardiovascular;  Laterality: N/A;  . TUBAL LIGATION      There were no vitals filed for this visit.                  Wound Therapy - 07/21/17 1752    Subjective Pt states she went for pre-op yesterday.  States she is to have open heart surgery tomorrow morning at 6am.  She is expected to be in the hospital for 2 weeks so she is unsure of when or if she will return here.    Patient and Family Stated Goals wounds to heal    Date of Onset 04/13/17   Pain Assessment No/denies pain   Evaluation and Treatment Procedures Explained to Patient/Family Yes   Evaluation and Treatment Procedures agreed to   Pressure Injury Properties Date First Assessed: 07/06/17 Time First Assessed: 1318 Location: Heel Location Orientation: Right Present on Admission: Yes   Dressing Type Impregnated gauze (bismuth)   Dressing Changed   Dressing Change Frequency PRN   State of Healing Other (Comment)  no granulation until debridement.    Site / Wound Assessment Yellow   %  Wound base Red or Granulating 90%  after debridement    % Wound base Yellow/Fibrinous Exudate 10%   Peri-wound Assessment Maceration   Wound Length (cm) 0.7 cm   Wound Width (cm) 0.8 cm   Wound Depth (cm) 0.1 cm   Wound Surface Area (cm^2) 0.56 cm^2   Wound Volume (cm^3) 0.06 cm^3   Margins --  heel tissue feels mushy    Drainage Amount Scant   Drainage Description Serous   Treatment Cleansed;Debridement (Selective)   Wound Properties Date First Assessed: 07/06/17 Time First Assessed: 1320 Wound Type: Diabetic ulcer Location: Foot Location Orientation: Right Wound Description (Comments): between 1st and second  toes Present on Admission: Yes   Dressing Changed Changed   Dressing Status None   Dressing Change Frequency PRN   Site / Wound Assessment Clean;Dry   % Wound base Red or Granulating 80%   % Wound base Yellow/Fibrinous Exudate 20%   Wound Length (cm) 2 cm   Wound Width (cm) 0.2 cm   Wound Depth (cm) 0.2 cm   Wound Volume (cm^3) 0.08 cm^3   Wound Surface Area (cm^2) 0.4 cm^2   Margins Epibole (rolled edges)   Drainage Amount Scant   Drainage Description Serous   Wound Properties Date First Assessed: 07/16/17 Time First Assessed: 1654 Wound Type: Other (Comment) Location: Toe (Comment  which one) Location Orientation: Left Wound Description (Comments): second toe, plantar surface Present  on Admission: No   Dressing Type None   Dressing Changed Changed   Dressing Status None   Dressing Change Frequency PRN   Site / Wound Assessment Dry   % Wound base Red or Granulating 90%   % Wound base Yellow/Fibrinous Exudate 10%   Peri-wound Assessment Other (Comment)  dry   Wound Length (cm) 0.2 cm  was 0.2 cm   Wound Width (cm) 0.8 cm  was 1.5cm   Wound Depth (cm) 0.1 cm   Wound Volume (cm^3) 0.02 cm^3   Wound Surface Area (cm^2) 0.16 cm^2   Margins Unattached edges (unapproximated)   Drainage Amount Scant   Drainage Description Serosanguineous   Treatment  Cleansed;Debridement (Selective)   Selective Debridement - Location wound bed of all wounds    Selective Debridement - Tools Used Forceps;Scalpel;Scissors   Selective Debridement - Tissue Removed devitalized tissue, slough , epiboled edges    Wound Therapy - Clinical Statement Bandage was off today due to inspecting wounds during pre-op visit.  Wounds remeasured this session with overall improvement noted, increased granulation and approximation. Cleansed and debrided well and rebandaged with xeroform.  Secured all bandages with medipore tape.  Pt is to keep [place] informed if she will be returning following surgery. Contiued dressing with xeroform and covered with medipore.     Wound Therapy - Functional Problem List difficulty walking    Factors Delaying/Impairing Wound Healing Altered sensation;Diabetes Mellitus;Multiple medical problems;Polypharmacy;Vascular compromise   Hydrotherapy Plan Debridement;Dressing change;Patient/family education   Wound Therapy - Frequency --  2  x a week for 4 weeks    Wound Therapy - Current Recommendations PT   Wound Plan Hold for 3 weeks and resume if ordered following surgery.  If not, can be discharged.   Dressing  xeroform to all wounds  followed by dry 2x2 and dressed with 3" kling followed by netting to keep wounds in place.                   PT Short Term Goals - 07/06/17 1618      PT SHORT TERM GOAL #1   Title Wounds to be 100% granulated to prevent infection    Time 2   Period Weeks   Status New   Target Date 07/20/17     PT SHORT TERM GOAL #2   Title PT to verbalize the importance of visually checking her feet everyday for any wound    Time 2   Period Weeks   Status New           PT Long Term Goals - 07/06/17 1619      PT LONG TERM GOAL #1   Title Wound in between pt great and first toe to be no greater than 1.5 x .2 x.1 cm to allow pt to be comfortable in self care.    Time 4   Period Weeks   Status New   Target Date  08/03/17     PT LONG TERM GOAL #2   Title Pt heel wound to be no greater than .5 x .5 x 0 to allow pt to be comfortable in self care.    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 Dyspnea   . Coronary artery disease   . Severe mitral insufficiency   . Atrial flutter (HCC)   . Mitral regurgitation 06/24/2017  . Cardiomyopathy (HCC) 06/20/2017  . Elevated troponin 06/19/2017  . CKD (chronic kidney disease) stage 3, GFR 30-59 ml/min (HCC) 06/19/2017  . Pressure injury of skin 06/19/2017  . Hyperkalemia 06/18/2017  . Type 2 diabetes mellitus, uncontrolled, with neuropathy (HCC) 06/18/2017  . Hyperlipidemia 06/18/2017  . Chest pain 06/18/2017  . Bronchopneumonia 03/06/2016  . CAP (community acquired pneumonia) 03/05/2016  . Insulin dependent diabetes mellitus (HCC) 03/05/2016  . Essential hypertension 03/05/2016  . Hyponatremia 03/05/2016  . Normocytic anemia 03/05/2016  . Hyperglycemia 03/05/2016  . Dehydration 03/05/2016  . Hypoxia 03/05/2016  . Leukocytosis 03/05/2016  . Acute respiratory failure with hypoxia (HCC) 03/05/2016   Lurena Nida, PTA/CLT [phone]  Virgina Organ, PT CLT [phone] 07/21/2017, 6:04 PM  [facility] [address] [place], [place], [address] Phone: [phone]   Fax:  [phone]  Name: Jill [name] MRN: [id number] Date of Birth: 1961-05-15

## 2017-07-22 ENCOUNTER — Inpatient Hospital Stay (HOSPITAL_COMMUNITY): Payer: Medicaid Other

## 2017-07-22 ENCOUNTER — Inpatient Hospital Stay (HOSPITAL_COMMUNITY)
Admission: RE | Admit: 2017-07-22 | Discharge: 2017-07-28 | DRG: 236 | Disposition: A | Discharge status: Home / Self Care | Payer: Medicaid Other | Source: Ambulatory Visit | Attending: Thoracic Surgery (Cardiothoracic Vascular Surgery) | Admitting: Thoracic Surgery (Cardiothoracic Vascular Surgery)

## 2017-07-22 ENCOUNTER — Inpatient Hospital Stay (HOSPITAL_COMMUNITY): Payer: Medicaid Other | Admitting: Emergency Medicine

## 2017-07-22 ENCOUNTER — Inpatient Hospital Stay (HOSPITAL_COMMUNITY)
Admission: RE | Disposition: A | Discharge status: Home / Self Care | Payer: Self-pay | Source: Ambulatory Visit | Attending: Thoracic Surgery (Cardiothoracic Vascular Surgery)

## 2017-07-22 ENCOUNTER — Inpatient Hospital Stay (HOSPITAL_COMMUNITY): Payer: Medicaid Other | Admitting: Certified Registered"

## 2017-07-22 ENCOUNTER — Encounter (HOSPITAL_COMMUNITY): Payer: Self-pay

## 2017-07-22 DIAGNOSIS — E8770 Fluid overload, unspecified: Secondary | ICD-10-CM | POA: Diagnosis present

## 2017-07-22 DIAGNOSIS — I340 Nonrheumatic mitral (valve) insufficiency: Secondary | ICD-10-CM | POA: Diagnosis present

## 2017-07-22 DIAGNOSIS — I10 Essential (primary) hypertension: Secondary | ICD-10-CM | POA: Diagnosis present

## 2017-07-22 DIAGNOSIS — Z7982 Long term (current) use of aspirin: Secondary | ICD-10-CM

## 2017-07-22 DIAGNOSIS — J9811 Atelectasis: Secondary | ICD-10-CM | POA: Diagnosis not present

## 2017-07-22 DIAGNOSIS — E785 Hyperlipidemia, unspecified: Secondary | ICD-10-CM | POA: Diagnosis present

## 2017-07-22 DIAGNOSIS — D696 Thrombocytopenia, unspecified: Secondary | ICD-10-CM | POA: Diagnosis present

## 2017-07-22 DIAGNOSIS — D62 Acute posthemorrhagic anemia: Secondary | ICD-10-CM | POA: Diagnosis not present

## 2017-07-22 DIAGNOSIS — Z8249 Family history of ischemic heart disease and other diseases of the circulatory system: Secondary | ICD-10-CM | POA: Diagnosis not present

## 2017-07-22 DIAGNOSIS — Z794 Long term (current) use of insulin: Secondary | ICD-10-CM

## 2017-07-22 DIAGNOSIS — Z833 Family history of diabetes mellitus: Secondary | ICD-10-CM | POA: Diagnosis not present

## 2017-07-22 DIAGNOSIS — I2510 Atherosclerotic heart disease of native coronary artery without angina pectoris: Secondary | ICD-10-CM | POA: Diagnosis present

## 2017-07-22 DIAGNOSIS — N390 Urinary tract infection, site not specified: Secondary | ICD-10-CM | POA: Diagnosis present

## 2017-07-22 DIAGNOSIS — Z6839 Body mass index (BMI) 39.0-39.9, adult: Secondary | ICD-10-CM

## 2017-07-22 DIAGNOSIS — Z79899 Other long term (current) drug therapy: Secondary | ICD-10-CM | POA: Diagnosis not present

## 2017-07-22 DIAGNOSIS — E669 Obesity, unspecified: Secondary | ICD-10-CM | POA: Diagnosis present

## 2017-07-22 DIAGNOSIS — E11649 Type 2 diabetes mellitus with hypoglycemia without coma: Secondary | ICD-10-CM | POA: Diagnosis present

## 2017-07-22 DIAGNOSIS — E1140 Type 2 diabetes mellitus with diabetic neuropathy, unspecified: Secondary | ICD-10-CM | POA: Diagnosis present

## 2017-07-22 DIAGNOSIS — Z951 Presence of aortocoronary bypass graft: Secondary | ICD-10-CM

## 2017-07-22 HISTORY — PX: CORONARY ARTERY BYPASS GRAFT: SHX141

## 2017-07-22 HISTORY — PX: TEE WITHOUT CARDIOVERSION: SHX5443

## 2017-07-22 LAB — CBC
HEMATOCRIT: 28.2 % — AB (ref 36.0–46.0)
HEMATOCRIT: 27.1 % — AB (ref 36.0–46.0)
HEMOGLOBIN: 8.6 g/dL — AB (ref 12.0–15.0)
HEMOGLOBIN: 8.2 g/dL — AB (ref 12.0–15.0)
MCH: 25.1 pg — ABNORMAL LOW (ref 26.0–34.0)
MCH: 25.1 pg — AB (ref 26.0–34.0)
MCHC: 30.5 g/dL (ref 30.0–36.0)
MCHC: 30.3 g/dL (ref 30.0–36.0)
MCV: 82.5 fL (ref 78.0–100.0)
MCV: 82.9 fL (ref 78.0–100.0)
Platelets: 156 10*3/uL (ref 150–400)
Platelets: 162 10*3/uL (ref 150–400)
RBC: 3.42 MIL/uL — ABNORMAL LOW (ref 3.87–5.11)
RBC: 3.27 MIL/uL — ABNORMAL LOW (ref 3.87–5.11)
RDW: 13.8 % (ref 11.5–15.5)
RDW: 14 % (ref 11.5–15.5)
WBC: 15.2 10*3/uL — AB (ref 4.0–10.5)
WBC: 10.9 10*3/uL — ABNORMAL HIGH (ref 4.0–10.5)

## 2017-07-22 LAB — POCT I-STAT 3, ART BLOOD GAS (G3+)
ACID-BASE DEFICIT: 1 mmol/L (ref 0.0–2.0)
ACID-BASE DEFICIT: 3 mmol/L — AB (ref 0.0–2.0)
Acid-Base Excess: 5 mmol/L — ABNORMAL HIGH (ref 0.0–2.0)
Acid-Base Excess: 1 mmol/L (ref 0.0–2.0)
Acid-base deficit: 1 mmol/L (ref 0.0–2.0)
BICARBONATE: 24.8 mmol/L (ref 20.0–28.0)
BICARBONATE: 26.8 mmol/L (ref 20.0–28.0)
BICARBONATE: 25 mmol/L (ref 20.0–28.0)
BICARBONATE: 23 mmol/L (ref 20.0–28.0)
Bicarbonate: 29 mmol/L — ABNORMAL HIGH (ref 20.0–28.0)
Bicarbonate: 25.7 mmol/L (ref 20.0–28.0)
O2 SAT: 97 %
O2 SAT: 97 %
O2 SAT: 99 %
O2 SAT: 98 %
O2 Saturation: 100 %
O2 Saturation: 99 %
PCO2 ART: 36.7 mmHg (ref 32.0–48.0)
PCO2 ART: 33.6 mmHg (ref 32.0–48.0)
PCO2 ART: 53.5 mmHg — AB (ref 32.0–48.0)
PCO2 ART: 50.3 mmHg — AB (ref 32.0–48.0)
PH ART: 7.506 — AB (ref 7.350–7.450)
PH ART: 7.305 — AB (ref 7.350–7.450)
PH ART: 7.268 — AB (ref 7.350–7.450)
PO2 ART: 89 mmHg (ref 83.0–108.0)
PO2 ART: 132 mmHg — AB (ref 83.0–108.0)
PO2 ART: 116 mmHg — AB (ref 83.0–108.0)
Patient temperature: 35.8
Patient temperature: 37.2
TCO2: 30 mmol/L (ref 22–32)
TCO2: 26 mmol/L (ref 22–32)
TCO2: 28 mmol/L (ref 22–32)
TCO2: 26 mmol/L (ref 22–32)
TCO2: 27 mmol/L (ref 22–32)
TCO2: 24 mmol/L (ref 22–32)
pCO2 arterial: 57 mmHg — ABNORMAL HIGH (ref 32.0–48.0)
pCO2 arterial: 47.3 mmHg (ref 32.0–48.0)
pH, Arterial: 7.476 — ABNORMAL HIGH (ref 7.350–7.450)
pH, Arterial: 7.302 — ABNORMAL LOW (ref 7.350–7.450)
pH, Arterial: 7.302 — ABNORMAL LOW (ref 7.350–7.450)
pO2, Arterial: 418 mmHg — ABNORMAL HIGH (ref 83.0–108.0)
pO2, Arterial: 98 mmHg (ref 83.0–108.0)
pO2, Arterial: 159 mmHg — ABNORMAL HIGH (ref 83.0–108.0)

## 2017-07-22 LAB — POCT I-STAT, CHEM 8
BUN: 38 mg/dL — ABNORMAL HIGH (ref 6–20)
BUN: 33 mg/dL — ABNORMAL HIGH (ref 6–20)
BUN: 33 mg/dL — ABNORMAL HIGH (ref 6–20)
BUN: 32 mg/dL — ABNORMAL HIGH (ref 6–20)
BUN: 31 mg/dL — AB (ref 6–20)
BUN: 32 mg/dL — AB (ref 6–20)
BUN: 26 mg/dL — ABNORMAL HIGH (ref 6–20)
CALCIUM ION: 1.16 mmol/L (ref 1.15–1.40)
CALCIUM ION: 1.09 mmol/L — AB (ref 1.15–1.40)
CALCIUM ION: 1.19 mmol/L (ref 1.15–1.40)
CHLORIDE: 102 mmol/L (ref 101–111)
CHLORIDE: 104 mmol/L (ref 101–111)
CHLORIDE: 104 mmol/L (ref 101–111)
CREATININE: 1.2 mg/dL — AB (ref 0.44–1.00)
Calcium, Ion: 1.18 mmol/L (ref 1.15–1.40)
Calcium, Ion: 0.95 mmol/L — ABNORMAL LOW (ref 1.15–1.40)
Calcium, Ion: 1.08 mmol/L — ABNORMAL LOW (ref 1.15–1.40)
Calcium, Ion: 1.08 mmol/L — ABNORMAL LOW (ref 1.15–1.40)
Chloride: 101 mmol/L (ref 101–111)
Chloride: 105 mmol/L (ref 101–111)
Chloride: 103 mmol/L (ref 101–111)
Chloride: 110 mmol/L (ref 101–111)
Creatinine, Ser: 1 mg/dL (ref 0.44–1.00)
Creatinine, Ser: 0.9 mg/dL (ref 0.44–1.00)
Creatinine, Ser: 1.1 mg/dL — ABNORMAL HIGH (ref 0.44–1.00)
Creatinine, Ser: 1 mg/dL (ref 0.44–1.00)
Creatinine, Ser: 1 mg/dL (ref 0.44–1.00)
Creatinine, Ser: 1 mg/dL (ref 0.44–1.00)
GLUCOSE: 211 mg/dL — AB (ref 65–99)
GLUCOSE: 164 mg/dL — AB (ref 65–99)
GLUCOSE: 120 mg/dL — AB (ref 65–99)
GLUCOSE: 158 mg/dL — AB (ref 65–99)
Glucose, Bld: 130 mg/dL — ABNORMAL HIGH (ref 65–99)
Glucose, Bld: 149 mg/dL — ABNORMAL HIGH (ref 65–99)
Glucose, Bld: 164 mg/dL — ABNORMAL HIGH (ref 65–99)
HCT: 29 % — ABNORMAL LOW (ref 36.0–46.0)
HCT: 28 % — ABNORMAL LOW (ref 36.0–46.0)
HCT: 24 % — ABNORMAL LOW (ref 36.0–46.0)
HEMATOCRIT: 22 % — AB (ref 36.0–46.0)
HEMATOCRIT: 26 % — AB (ref 36.0–46.0)
HEMATOCRIT: 26 % — AB (ref 36.0–46.0)
HEMATOCRIT: 27 % — AB (ref 36.0–46.0)
HEMOGLOBIN: 9.9 g/dL — AB (ref 12.0–15.0)
HEMOGLOBIN: 9.5 g/dL — AB (ref 12.0–15.0)
HEMOGLOBIN: 7.5 g/dL — AB (ref 12.0–15.0)
HEMOGLOBIN: 9.2 g/dL — AB (ref 12.0–15.0)
Hemoglobin: 8.8 g/dL — ABNORMAL LOW (ref 12.0–15.0)
Hemoglobin: 8.8 g/dL — ABNORMAL LOW (ref 12.0–15.0)
Hemoglobin: 8.2 g/dL — ABNORMAL LOW (ref 12.0–15.0)
POTASSIUM: 4.2 mmol/L (ref 3.5–5.1)
POTASSIUM: 4.6 mmol/L (ref 3.5–5.1)
POTASSIUM: 5.4 mmol/L — AB (ref 3.5–5.1)
Potassium: 4.6 mmol/L (ref 3.5–5.1)
Potassium: 3.8 mmol/L (ref 3.5–5.1)
Potassium: 4.7 mmol/L (ref 3.5–5.1)
Potassium: 5.1 mmol/L (ref 3.5–5.1)
SODIUM: 142 mmol/L (ref 135–145)
SODIUM: 142 mmol/L (ref 135–145)
SODIUM: 141 mmol/L (ref 135–145)
SODIUM: 140 mmol/L (ref 135–145)
SODIUM: 141 mmol/L (ref 135–145)
Sodium: 139 mmol/L (ref 135–145)
Sodium: 142 mmol/L (ref 135–145)
TCO2: 29 mmol/L (ref 22–32)
TCO2: 27 mmol/L (ref 22–32)
TCO2: 30 mmol/L (ref 22–32)
TCO2: 29 mmol/L (ref 22–32)
TCO2: 24 mmol/L (ref 22–32)
TCO2: 28 mmol/L (ref 22–32)
TCO2: 25 mmol/L (ref 22–32)

## 2017-07-22 LAB — POCT I-STAT 4, (NA,K, GLUC, HGB,HCT)
GLUCOSE: 126 mg/dL — AB (ref 65–99)
HCT: 28 % — ABNORMAL LOW (ref 36.0–46.0)
Hemoglobin: 9.5 g/dL — ABNORMAL LOW (ref 12.0–15.0)
Potassium: 4.2 mmol/L (ref 3.5–5.1)
Sodium: 146 mmol/L — ABNORMAL HIGH (ref 135–145)

## 2017-07-22 LAB — GLUCOSE, CAPILLARY
GLUCOSE-CAPILLARY: 243 mg/dL — AB (ref 65–99)
GLUCOSE-CAPILLARY: 92 mg/dL (ref 65–99)
GLUCOSE-CAPILLARY: 90 mg/dL (ref 65–99)
GLUCOSE-CAPILLARY: 95 mg/dL (ref 65–99)
GLUCOSE-CAPILLARY: 140 mg/dL — AB (ref 65–99)
GLUCOSE-CAPILLARY: 206 mg/dL — AB (ref 65–99)
GLUCOSE-CAPILLARY: 155 mg/dL — AB (ref 65–99)
Glucose-Capillary: 83 mg/dL (ref 65–99)

## 2017-07-22 LAB — MAGNESIUM: Magnesium: 3.2 mg/dL — ABNORMAL HIGH (ref 1.7–2.4)

## 2017-07-22 LAB — HEMOGLOBIN AND HEMATOCRIT, BLOOD
HEMATOCRIT: 27.1 % — AB (ref 36.0–46.0)
Hemoglobin: 8.3 g/dL — ABNORMAL LOW (ref 12.0–15.0)

## 2017-07-22 LAB — CREATININE, SERUM
Creatinine, Ser: 1.11 mg/dL — ABNORMAL HIGH (ref 0.44–1.00)
GFR calc Af Amer: >60 mL/min (ref >60)
GFR calc non Af Amer: 54 mL/min — ABNORMAL LOW (ref >60)

## 2017-07-22 LAB — PROTIME-INR
INR: 1.45
PROTHROMBIN TIME: 17.5 s — AB (ref 11.4–15.2)

## 2017-07-22 LAB — PREPARE RBC (CROSSMATCH)

## 2017-07-22 LAB — PLATELET COUNT: PLATELETS: 185 10*3/uL (ref 150–400)

## 2017-07-22 LAB — APTT: APTT: 31 s (ref 24–36)

## 2017-07-22 SURGERY — CORONARY ARTERY BYPASS GRAFTING (CABG)
Anesthesia: General | Site: Chest

## 2017-07-22 MED ORDER — LACTATED RINGERS IV SOLN
INTRAVENOUS | Status: DC | PRN
  Administered 2017-07-22: 07:00:00 via INTRAVENOUS

## 2017-07-22 MED ORDER — MORPHINE SULFATE (PF) 2 MG/ML IV SOLN: 2.0000 mg | INTRAVENOUS | Status: DC | PRN

## 2017-07-22 MED ORDER — PHENYLEPHRINE HCL 10 MG/ML IJ SOLN
0.0000 ug/min | INTRAMUSCULAR | Status: DC
  Administered 2017-07-23: 30 ug/min via INTRAVENOUS
  Administered 2017-07-24: 10 ug/min via INTRAVENOUS
  Administered 2017-07-24 – 2017-07-25 (×2): 40 ug/min via INTRAVENOUS
  Filled 2017-07-22 (×6): qty 2

## 2017-07-22 MED ORDER — CHLORHEXIDINE GLUCONATE 0.12 % MT SOLN
15.0000 mL | OROMUCOSAL | Status: AC
  Administered 2017-07-22: 15 mL via OROMUCOSAL

## 2017-07-22 MED ORDER — METOPROLOL TARTRATE 5 MG/5ML IV SOLN: 2.5000 mg | INTRAVENOUS | Status: DC | PRN

## 2017-07-22 MED ORDER — SODIUM CHLORIDE 0.9% FLUSH: 3.0000 mL | INTRAVENOUS | Status: DC | PRN

## 2017-07-22 MED ORDER — PROTAMINE SULFATE 10 MG/ML IV SOLN
INTRAVENOUS | Status: DC | PRN
  Administered 2017-07-22: 340 mg via INTRAVENOUS

## 2017-07-22 MED ORDER — EPHEDRINE 5 MG/ML INJ
INTRAVENOUS | Status: AC
  Filled 2017-07-22: qty 10

## 2017-07-22 MED ORDER — HEMOSTATIC AGENTS (NO CHARGE) OPTIME
TOPICAL | Status: DC | PRN
  Administered 2017-07-22: 1 via TOPICAL

## 2017-07-22 MED ORDER — HEPARIN SODIUM (PORCINE) 1000 UNIT/ML IJ SOLN
INTRAMUSCULAR | Status: DC | PRN
  Administered 2017-07-22: 32000 [IU] via INTRAVENOUS
  Administered 2017-07-22: 2000 [IU] via INTRAVENOUS

## 2017-07-22 MED ORDER — ASPIRIN EC 325 MG PO TBEC
325.0000 mg | DELAYED_RELEASE_TABLET | Freq: Every day | ORAL | Status: DC
  Administered 2017-07-23 – 2017-07-28 (×6): 325 mg via ORAL
  Filled 2017-07-22 (×6): qty 1

## 2017-07-22 MED ORDER — ALBUMIN HUMAN 5 % IV SOLN
250.0000 mL | INTRAVENOUS | Status: AC | PRN
  Administered 2017-07-22 (×4): 250 mL via INTRAVENOUS
  Filled 2017-07-22: qty 250

## 2017-07-22 MED ORDER — ORAL CARE MOUTH RINSE
15.0000 mL | Freq: Two times a day (BID) | OROMUCOSAL | Status: DC
  Administered 2017-07-22 – 2017-07-23 (×2): 15 mL via OROMUCOSAL

## 2017-07-22 MED ORDER — BISACODYL 5 MG PO TBEC
10.0000 mg | DELAYED_RELEASE_TABLET | Freq: Every day | ORAL | Status: DC
  Administered 2017-07-23 – 2017-07-28 (×3): 10 mg via ORAL
  Filled 2017-07-22 (×4): qty 2

## 2017-07-22 MED ORDER — SODIUM CHLORIDE 0.9% FLUSH
10.0000 mL | Freq: Two times a day (BID) | INTRAVENOUS | Status: DC
  Administered 2017-07-22 – 2017-07-24 (×4): 10 mL
  Administered 2017-07-25: 20 mL

## 2017-07-22 MED ORDER — PROPOFOL 10 MG/ML IV BOLUS
INTRAVENOUS | Status: DC | PRN
  Administered 2017-07-22: 50 mg via INTRAVENOUS

## 2017-07-22 MED ORDER — SODIUM CHLORIDE 0.45 % IV SOLN
INTRAVENOUS | Status: DC | PRN
  Administered 2017-07-22: 15:00:00 via INTRAVENOUS

## 2017-07-22 MED ORDER — FAMOTIDINE IN NACL 20-0.9 MG/50ML-% IV SOLN
20.0000 mg | Freq: Two times a day (BID) | INTRAVENOUS | Status: AC
  Administered 2017-07-22: 20 mg via INTRAVENOUS

## 2017-07-22 MED ORDER — SODIUM CHLORIDE 0.9% FLUSH: 10.0000 mL | INTRAVENOUS | Status: DC | PRN

## 2017-07-22 MED ORDER — ALBUMIN HUMAN 5 % IV SOLN
INTRAVENOUS | Status: DC | PRN
  Administered 2017-07-22: 14:00:00 via INTRAVENOUS

## 2017-07-22 MED ORDER — SODIUM CHLORIDE 0.9 % IV SOLN
INTRAVENOUS | Status: DC
  Filled 2017-07-22: qty 1

## 2017-07-22 MED ORDER — ROCURONIUM BROMIDE 10 MG/ML (PF) SYRINGE
PREFILLED_SYRINGE | INTRAVENOUS | Status: AC
  Filled 2017-07-22: qty 5

## 2017-07-22 MED ORDER — VITAMIN C 500 MG PO TABS
500.0000 mg | ORAL_TABLET | Freq: Every day | ORAL | Status: DC
  Administered 2017-07-23 – 2017-07-28 (×6): 500 mg via ORAL
  Filled 2017-07-22 (×6): qty 1

## 2017-07-22 MED ORDER — CHLORHEXIDINE GLUCONATE 4 % EX LIQD: 30.0000 mL | CUTANEOUS | Status: DC

## 2017-07-22 MED ORDER — VANCOMYCIN HCL IN DEXTROSE 1-5 GM/200ML-% IV SOLN
1000.0000 mg | Freq: Once | INTRAVENOUS | Status: AC
  Administered 2017-07-22: 1000 mg via INTRAVENOUS
  Filled 2017-07-22: qty 200

## 2017-07-22 MED ORDER — ROCURONIUM BROMIDE 10 MG/ML (PF) SYRINGE
PREFILLED_SYRINGE | INTRAVENOUS | Status: AC
  Filled 2017-07-22: qty 10

## 2017-07-22 MED ORDER — ALBUMIN HUMAN 5 % IV SOLN
12.5000 g | Freq: Once | INTRAVENOUS | Status: AC
  Administered 2017-07-22: 12.5 g via INTRAVENOUS

## 2017-07-22 MED ORDER — ROSUVASTATIN CALCIUM 10 MG PO TABS
20.0000 mg | ORAL_TABLET | Freq: Every day | ORAL | Status: DC
  Administered 2017-07-23 – 2017-07-27 (×5): 20 mg via ORAL
  Filled 2017-07-22 (×2): qty 2
  Filled 2017-07-22: qty 1
  Filled 2017-07-22: qty 2
  Filled 2017-07-22: qty 1
  Filled 2017-07-22: qty 2
  Filled 2017-07-22: qty 1

## 2017-07-22 MED ORDER — LACTATED RINGERS IV SOLN
INTRAVENOUS | Status: DC | PRN
  Administered 2017-07-22 (×2): via INTRAVENOUS

## 2017-07-22 MED ORDER — LACTATED RINGERS IV SOLN: 500.0000 mL | Freq: Once | INTRAVENOUS | Status: DC | PRN

## 2017-07-22 MED ORDER — PANTOPRAZOLE SODIUM 40 MG PO TBEC
40.0000 mg | DELAYED_RELEASE_TABLET | Freq: Every day | ORAL | Status: DC
  Administered 2017-07-24 – 2017-07-28 (×5): 40 mg via ORAL
  Filled 2017-07-22 (×5): qty 1

## 2017-07-22 MED ORDER — SODIUM CHLORIDE 0.9 % IV SOLN
0.0000 ug/kg/h | INTRAVENOUS | Status: DC
  Administered 2017-07-22: 0.5 ug/kg/h via INTRAVENOUS
  Filled 2017-07-22 (×2): qty 2

## 2017-07-22 MED ORDER — MIDAZOLAM HCL 5 MG/5ML IJ SOLN
INTRAMUSCULAR | Status: DC | PRN
  Administered 2017-07-22: 1 mg via INTRAVENOUS
  Administered 2017-07-22: 3 mg via INTRAVENOUS
  Administered 2017-07-22 (×3): 2 mg via INTRAVENOUS

## 2017-07-22 MED ORDER — GABAPENTIN 400 MG PO CAPS
800.0000 mg | ORAL_CAPSULE | Freq: Two times a day (BID) | ORAL | Status: DC
  Administered 2017-07-23 – 2017-07-25 (×6): 800 mg via ORAL
  Filled 2017-07-22 (×6): qty 2

## 2017-07-22 MED ORDER — LACTATED RINGERS IV SOLN: INTRAVENOUS | Status: DC

## 2017-07-22 MED ORDER — HEPARIN SODIUM (PORCINE) 1000 UNIT/ML IJ SOLN
INTRAMUSCULAR | Status: AC
  Filled 2017-07-22: qty 1

## 2017-07-22 MED ORDER — MORPHINE SULFATE (PF) 4 MG/ML IV SOLN
2.0000 mg | INTRAVENOUS | Status: DC | PRN
  Administered 2017-07-22 (×2): 2 mg via INTRAVENOUS
  Filled 2017-07-22 (×2): qty 1

## 2017-07-22 MED ORDER — INSULIN REGULAR BOLUS VIA INFUSION
0.0000 [IU] | Freq: Three times a day (TID) | INTRAVENOUS | Status: DC
  Filled 2017-07-22: qty 10

## 2017-07-22 MED ORDER — ACETAMINOPHEN 650 MG RE SUPP
650.0000 mg | Freq: Once | RECTAL | Status: AC
  Administered 2017-07-22: 650 mg via RECTAL

## 2017-07-22 MED ORDER — ROCURONIUM BROMIDE 10 MG/ML (PF) SYRINGE
PREFILLED_SYRINGE | INTRAVENOUS | Status: DC | PRN
  Administered 2017-07-22 (×2): 30 mg via INTRAVENOUS
  Administered 2017-07-22 (×2): 50 mg via INTRAVENOUS
  Administered 2017-07-22: 20 mg via INTRAVENOUS

## 2017-07-22 MED ORDER — SODIUM CHLORIDE 0.9 % IJ SOLN
OROMUCOSAL | Status: DC | PRN
  Administered 2017-07-22 (×3): 4 mL via TOPICAL

## 2017-07-22 MED ORDER — ALBUMIN HUMAN 5 % IV SOLN
INTRAVENOUS | Status: AC
  Filled 2017-07-22: qty 250

## 2017-07-22 MED ORDER — MORPHINE SULFATE (PF) 2 MG/ML IV SOLN: 1.0000 mg | INTRAVENOUS | Status: DC | PRN

## 2017-07-22 MED ORDER — 0.9 % SODIUM CHLORIDE (POUR BTL) OPTIME
TOPICAL | Status: DC | PRN
  Administered 2017-07-22: 6000 mL

## 2017-07-22 MED ORDER — MIDAZOLAM HCL 10 MG/2ML IJ SOLN
INTRAMUSCULAR | Status: AC
  Filled 2017-07-22: qty 2

## 2017-07-22 MED ORDER — SODIUM CHLORIDE 0.9 % IV SOLN
INTRAVENOUS | Status: DC
  Administered 2017-07-22 – 2017-07-23 (×2): via INTRAVENOUS

## 2017-07-22 MED ORDER — ACETAMINOPHEN 160 MG/5ML PO SOLN: 1000.0000 mg | Freq: Four times a day (QID) | ORAL | Status: AC

## 2017-07-22 MED ORDER — DIPHENHYDRAMINE HCL 50 MG/ML IJ SOLN
INTRAMUSCULAR | Status: AC
  Filled 2017-07-22: qty 1

## 2017-07-22 MED ORDER — PHENYLEPHRINE 40 MCG/ML (10ML) SYRINGE FOR IV PUSH (FOR BLOOD PRESSURE SUPPORT)
PREFILLED_SYRINGE | INTRAVENOUS | Status: AC
  Filled 2017-07-22: qty 10

## 2017-07-22 MED ORDER — PROPOFOL 10 MG/ML IV BOLUS
INTRAVENOUS | Status: AC
  Filled 2017-07-22: qty 20

## 2017-07-22 MED ORDER — METOCLOPRAMIDE HCL 5 MG/ML IJ SOLN
10.0000 mg | Freq: Four times a day (QID) | INTRAMUSCULAR | Status: AC
  Administered 2017-07-22 – 2017-07-23 (×4): 10 mg via INTRAVENOUS
  Filled 2017-07-22 (×4): qty 2

## 2017-07-22 MED ORDER — FENTANYL CITRATE (PF) 250 MCG/5ML IJ SOLN
INTRAMUSCULAR | Status: AC
  Filled 2017-07-22: qty 30

## 2017-07-22 MED ORDER — SODIUM CHLORIDE 0.9 % IV SOLN: 250.0000 mL | INTRAVENOUS | Status: DC

## 2017-07-22 MED ORDER — SODIUM CHLORIDE 0.9% FLUSH
3.0000 mL | Freq: Two times a day (BID) | INTRAVENOUS | Status: DC
  Administered 2017-07-23 – 2017-07-27 (×7): 3 mL via INTRAVENOUS

## 2017-07-22 MED ORDER — MAGNESIUM SULFATE 4 GM/100ML IV SOLN
4.0000 g | Freq: Once | INTRAVENOUS | Status: AC
  Administered 2017-07-22: 4 g via INTRAVENOUS
  Filled 2017-07-22: qty 100

## 2017-07-22 MED ORDER — DOCUSATE SODIUM 100 MG PO CAPS
200.0000 mg | ORAL_CAPSULE | Freq: Every day | ORAL | Status: DC
  Administered 2017-07-23 – 2017-07-28 (×3): 200 mg via ORAL
  Filled 2017-07-22 (×5): qty 2

## 2017-07-22 MED ORDER — BISACODYL 10 MG RE SUPP: 10.0000 mg | Freq: Every day | RECTAL | Status: DC

## 2017-07-22 MED ORDER — ACETAMINOPHEN 500 MG PO TABS
1000.0000 mg | ORAL_TABLET | Freq: Four times a day (QID) | ORAL | Status: AC
  Administered 2017-07-23 – 2017-07-27 (×15): 1000 mg via ORAL
  Filled 2017-07-22 (×18): qty 2

## 2017-07-22 MED ORDER — DOPAMINE-DEXTROSE 3.2-5 MG/ML-% IV SOLN: 0.0000 ug/kg/min | INTRAVENOUS | Status: DC

## 2017-07-22 MED ORDER — TRAMADOL HCL 50 MG PO TABS
50.0000 mg | ORAL_TABLET | ORAL | Status: DC | PRN
  Administered 2017-07-24: 100 mg via ORAL
  Filled 2017-07-22: qty 2

## 2017-07-22 MED ORDER — CHLORHEXIDINE GLUCONATE 0.12% ORAL RINSE (MEDLINE KIT): 15.0000 mL | Freq: Two times a day (BID) | OROMUCOSAL | Status: DC

## 2017-07-22 MED ORDER — OXYCODONE HCL 5 MG PO TABS
5.0000 mg | ORAL_TABLET | ORAL | Status: DC | PRN
  Administered 2017-07-23 (×2): 5 mg via ORAL
  Administered 2017-07-23: 10 mg via ORAL
  Filled 2017-07-22: qty 2
  Filled 2017-07-22 (×2): qty 1

## 2017-07-22 MED ORDER — MORPHINE SULFATE (PF) 4 MG/ML IV SOLN: 1.0000 mg | INTRAVENOUS | Status: AC | PRN

## 2017-07-22 MED ORDER — METOPROLOL TARTRATE 12.5 MG HALF TABLET
12.5000 mg | ORAL_TABLET | Freq: Two times a day (BID) | ORAL | Status: DC
  Administered 2017-07-24 – 2017-07-26 (×4): 12.5 mg via ORAL
  Filled 2017-07-22 (×6): qty 1

## 2017-07-22 MED ORDER — POTASSIUM CHLORIDE 10 MEQ/50ML IV SOLN: 10.0000 meq | INTRAVENOUS | Status: AC

## 2017-07-22 MED ORDER — ARTIFICIAL TEARS OPHTHALMIC OINT
TOPICAL_OINTMENT | OPHTHALMIC | Status: AC
  Filled 2017-07-22: qty 3.5

## 2017-07-22 MED ORDER — ORAL CARE MOUTH RINSE: 15.0000 mL | Freq: Four times a day (QID) | OROMUCOSAL | Status: DC

## 2017-07-22 MED ORDER — LIDOCAINE 2% (20 MG/ML) 5 ML SYRINGE
INTRAMUSCULAR | Status: AC
  Filled 2017-07-22: qty 5

## 2017-07-22 MED ORDER — FENTANYL CITRATE (PF) 250 MCG/5ML IJ SOLN
INTRAMUSCULAR | Status: AC
  Filled 2017-07-22: qty 5

## 2017-07-22 MED ORDER — DEXTROSE 5 % IV SOLN
1.5000 g | Freq: Two times a day (BID) | INTRAVENOUS | Status: AC
  Administered 2017-07-22 – 2017-07-24 (×4): 1.5 g via INTRAVENOUS
  Filled 2017-07-22 (×5): qty 1.5

## 2017-07-22 MED ORDER — ONDANSETRON HCL 4 MG/2ML IJ SOLN
4.0000 mg | Freq: Four times a day (QID) | INTRAMUSCULAR | Status: DC | PRN
  Administered 2017-07-24 – 2017-07-26 (×3): 4 mg via INTRAVENOUS
  Filled 2017-07-22 (×3): qty 2

## 2017-07-22 MED ORDER — ACETAMINOPHEN 160 MG/5ML PO SOLN: 650.0000 mg | Freq: Once | ORAL | Status: AC

## 2017-07-22 MED ORDER — SUCCINYLCHOLINE CHLORIDE 200 MG/10ML IV SOSY
PREFILLED_SYRINGE | INTRAVENOUS | Status: AC
  Filled 2017-07-22: qty 10

## 2017-07-22 MED ORDER — MIDAZOLAM HCL 2 MG/2ML IJ SOLN: 2.0000 mg | INTRAMUSCULAR | Status: DC | PRN

## 2017-07-22 MED ORDER — CHLORHEXIDINE GLUCONATE CLOTH 2 % EX PADS
6.0000 | MEDICATED_PAD | Freq: Every day | CUTANEOUS | Status: DC
  Administered 2017-07-22 – 2017-07-25 (×4): 6 via TOPICAL

## 2017-07-22 MED ORDER — METOPROLOL TARTRATE 25 MG/10 ML ORAL SUSPENSION
12.5000 mg | Freq: Two times a day (BID) | ORAL | Status: DC
  Administered 2017-07-24: 12.5 mg
  Filled 2017-07-22: qty 5

## 2017-07-22 MED ORDER — ASPIRIN 81 MG PO CHEW: 324.0000 mg | CHEWABLE_TABLET | Freq: Every day | ORAL | Status: DC

## 2017-07-22 MED ORDER — FENTANYL CITRATE (PF) 250 MCG/5ML IJ SOLN
INTRAMUSCULAR | Status: DC | PRN
  Administered 2017-07-22: 1450 ug via INTRAVENOUS
  Administered 2017-07-22: 250 ug via INTRAVENOUS
  Administered 2017-07-22: 50 ug via INTRAVENOUS

## 2017-07-22 MED ORDER — NITROGLYCERIN IN D5W 200-5 MCG/ML-% IV SOLN: 0.0000 ug/min | INTRAVENOUS | Status: DC

## 2017-07-22 SURGICAL SUPPLY — 137 items
ADAPTER CARDIO PERF ANTE/RETRO (ADAPTER) ×4 IMPLANT
BAG DECANTER FOR FLEXI CONT (MISCELLANEOUS) ×4 IMPLANT
BANDAGE ACE 4X5 VEL STRL LF (GAUZE/BANDAGES/DRESSINGS) ×4 IMPLANT
BANDAGE ACE 6X5 VEL STRL LF (GAUZE/BANDAGES/DRESSINGS) ×4 IMPLANT
BASKET HEART  (ORDER IN 25'S) (MISCELLANEOUS) ×1
BASKET HEART (ORDER IN 25'S) (MISCELLANEOUS) ×1
BASKET HEART (ORDER IN 25S) (MISCELLANEOUS) ×2 IMPLANT
BLADE NEEDLE 3 SS STRL (BLADE) ×3 IMPLANT
BLADE NEEDLE 3MM SS STRL (BLADE) ×1
BLADE STERNUM SYSTEM 6 (BLADE) ×4 IMPLANT
BLADE SURG 15 STRL LF DISP TIS (BLADE) ×4 IMPLANT
BLADE SURG 15 STRL SS (BLADE) ×4
BNDG GAUZE ELAST 4 BULKY (GAUZE/BANDAGES/DRESSINGS) ×4 IMPLANT
CANISTER SUCT 3000ML PPV (MISCELLANEOUS) ×4 IMPLANT
CANNULA ARTERIAL NVNT 3/8 22FR (MISCELLANEOUS) IMPLANT
CANNULA EZ GLIDE AORTIC 21FR (CANNULA) ×4 IMPLANT
CANNULA GUNDRY RCSP 15FR (MISCELLANEOUS) IMPLANT
CANNULA SUMP PERICARDIAL (CANNULA) ×4 IMPLANT
CATH CPB KIT HENDRICKSON (MISCELLANEOUS) ×4 IMPLANT
CATH ROBINSON RED A/P 18FR (CATHETERS) ×4 IMPLANT
CATH THORACIC 36FR (CATHETERS) ×4 IMPLANT
CATH THORACIC 36FR RT ANG (CATHETERS) ×4 IMPLANT
CLIP FOGARTY SPRING 6M (CLIP) ×4 IMPLANT
CLIP LIGATING EXTRA MED SLVR (CLIP) ×8 IMPLANT
CLIP VESOCCLUDE MED 24/CT (CLIP) IMPLANT
CLIP VESOCCLUDE SM WIDE 24/CT (CLIP) ×20 IMPLANT
CONN 1/2X1/2X1/2  BEN (MISCELLANEOUS) ×4
CONN 1/2X1/2X1/2 BEN (MISCELLANEOUS) ×4 IMPLANT
CONN 3/8X1/2 ST GISH (MISCELLANEOUS) ×12 IMPLANT
CONN ST 1/2X1/2  BEN (MISCELLANEOUS) ×2
CONN ST 1/2X1/2 BEN (MISCELLANEOUS) ×2 IMPLANT
CONN Y 3/8X3/8X3/8  BEN (MISCELLANEOUS)
CONN Y 3/8X3/8X3/8 BEN (MISCELLANEOUS) IMPLANT
COVER MAYO STAND STRL (DRAPES) ×4 IMPLANT
CRADLE DONUT ADULT HEAD (MISCELLANEOUS) ×4 IMPLANT
DRAIN JACKSON PRT FLT 10 (DRAIN) ×4 IMPLANT
DRAPE CARDIOVASCULAR INCISE (DRAPES) ×2
DRAPE SLUSH/WARMER DISC (DRAPES) ×4 IMPLANT
DRAPE SRG 135X102X78XABS (DRAPES) ×2 IMPLANT
DRSG COVADERM 4X14 (GAUZE/BANDAGES/DRESSINGS) ×4 IMPLANT
ELECT CAUTERY BLADE 6.4 (BLADE) IMPLANT
ELECT REM PT RETURN 9FT ADLT (ELECTROSURGICAL) ×8
ELECTRODE REM PT RTRN 9FT ADLT (ELECTROSURGICAL) ×4 IMPLANT
EVACUATOR SILICONE 100CC (DRAIN) ×4 IMPLANT
FELT TEFLON 1X6 (MISCELLANEOUS) ×8 IMPLANT
GAUZE SPONGE 4X4 12PLY STRL (GAUZE/BANDAGES/DRESSINGS) ×8 IMPLANT
GLOVE BIO SURGEON STRL SZ 6 (GLOVE) ×16 IMPLANT
GLOVE BIO SURGEON STRL SZ 6.5 (GLOVE) ×18 IMPLANT
GLOVE BIO SURGEONS STRL SZ 6.5 (GLOVE) ×6
GLOVE SURG SIGNA 7.5 PF LTX (GLOVE) ×16 IMPLANT
GOWN STRL REUS W/ TWL LRG LVL3 (GOWN DISPOSABLE) ×18 IMPLANT
GOWN STRL REUS W/ TWL XL LVL3 (GOWN DISPOSABLE) ×6 IMPLANT
GOWN STRL REUS W/TWL LRG LVL3 (GOWN DISPOSABLE) ×18
GOWN STRL REUS W/TWL XL LVL3 (GOWN DISPOSABLE) ×6
HEMOSTAT POWDER SURGIFOAM 1G (HEMOSTASIS) ×12 IMPLANT
HEMOSTAT SURGICEL 2X14 (HEMOSTASIS) ×4 IMPLANT
INSERT FOGARTY XLG (MISCELLANEOUS) IMPLANT
KIT BASIN OR (CUSTOM PROCEDURE TRAY) ×4 IMPLANT
KIT ROOM TURNOVER OR (KITS) ×4 IMPLANT
KIT SUCTION CATH 14FR (SUCTIONS) ×8 IMPLANT
KIT VASOVIEW HEMOPRO VH 3000 (KITS) ×4 IMPLANT
LINE VENT (MISCELLANEOUS) ×4 IMPLANT
MARKER GRAFT CORONARY BYPASS (MISCELLANEOUS) ×12 IMPLANT
NS IRRIG 1000ML POUR BTL (IV SOLUTION) ×24 IMPLANT
PACK OPEN HEART (CUSTOM PROCEDURE TRAY) ×4 IMPLANT
PAD ARMBOARD 7.5X6 YLW CONV (MISCELLANEOUS) ×8 IMPLANT
PAD ELECT DEFIB RADIOL ZOLL (MISCELLANEOUS) ×4 IMPLANT
PENCIL BUTTON HOLSTER BLD 10FT (ELECTRODE) ×4 IMPLANT
PUNCH AORTIC ROTATE  4.5MM 8IN (MISCELLANEOUS) ×4 IMPLANT
PUNCH AORTIC ROTATE 4.0MM (MISCELLANEOUS) IMPLANT
PUNCH AORTIC ROTATE 4.5MM 8IN (MISCELLANEOUS) IMPLANT
PUNCH AORTIC ROTATE 5MM 8IN (MISCELLANEOUS) IMPLANT
SET CARDIOPLEGIA MPS 5001102 (MISCELLANEOUS) ×4 IMPLANT
SOLUTION ANTI FOG 6CC (MISCELLANEOUS) ×4 IMPLANT
SPONGE LAP 18X18 X RAY DECT (DISPOSABLE) ×8 IMPLANT
SPONGE LAP 4X18 X RAY DECT (DISPOSABLE) ×4 IMPLANT
STOPCOCK 4 WAY LG BORE MALE ST (IV SETS) ×4 IMPLANT
SUCKER WEIGHTED FLEX (MISCELLANEOUS) ×4 IMPLANT
SUT BONE WAX W31G (SUTURE) ×4 IMPLANT
SUT ETHIBOND 2 0 SH (SUTURE) ×18 IMPLANT
SUT ETHIBOND 2 0 SH 36X2 (SUTURE) ×14 IMPLANT
SUT ETHIBOND 2 0 V4 (SUTURE) IMPLANT
SUT ETHIBOND 2 0V4 GREEN (SUTURE) IMPLANT
SUT ETHILON 3 0 FSL (SUTURE) ×4 IMPLANT
SUT MNCRL AB 4-0 PS2 18 (SUTURE) ×8 IMPLANT
SUT PROLENE 3 0 SH DA (SUTURE) ×4 IMPLANT
SUT PROLENE 3 0 SH1 36 (SUTURE) ×4 IMPLANT
SUT PROLENE 4 0 RB 1 (SUTURE) ×2
SUT PROLENE 4 0 SH DA (SUTURE) ×4 IMPLANT
SUT PROLENE 4-0 RB1 .5 CRCL 36 (SUTURE) ×2 IMPLANT
SUT PROLENE 5 0 C 1 36 (SUTURE) ×16 IMPLANT
SUT PROLENE 5 0 CC1 (SUTURE) ×4 IMPLANT
SUT PROLENE 6 0 C 1 30 (SUTURE) ×24 IMPLANT
SUT PROLENE 7 0 BV 1 (SUTURE) ×12 IMPLANT
SUT PROLENE 7 0 BV1 MDA (SUTURE) ×12 IMPLANT
SUT PROLENE 8 0 BV175 6 (SUTURE) ×20 IMPLANT
SUT SILK  1 MH (SUTURE) ×10
SUT SILK 1 MH (SUTURE) ×10 IMPLANT
SUT SILK 1 TIES 10X30 (SUTURE) ×8 IMPLANT
SUT SILK 2 0 (SUTURE) ×2
SUT SILK 2 0 SH CR/8 (SUTURE) ×20 IMPLANT
SUT SILK 2 0 TIES 10X30 (SUTURE) ×4 IMPLANT
SUT SILK 2 0 TIES 17X18 (SUTURE) ×2
SUT SILK 2-0 18XBRD TIE 12 (SUTURE) ×2 IMPLANT
SUT SILK 2-0 18XBRD TIE BLK (SUTURE) ×2 IMPLANT
SUT SILK 3 0 SH CR/8 (SUTURE) ×8 IMPLANT
SUT SILK 4 0 (SUTURE) ×2
SUT SILK 4 0 TIE 10X30 (SUTURE) ×8 IMPLANT
SUT SILK 4-0 18XBRD TIE 12 (SUTURE) ×2 IMPLANT
SUT STEEL 6MS V (SUTURE) ×4 IMPLANT
SUT STEEL STERNAL CCS#1 18IN (SUTURE) IMPLANT
SUT STEEL SZ 6 DBL 3X14 BALL (SUTURE) ×4 IMPLANT
SUT TEM PAC WIRE 2 0 SH (SUTURE) ×32 IMPLANT
SUT VIC AB 1 CTX 36 (SUTURE) ×4
SUT VIC AB 1 CTX36XBRD ANBCTR (SUTURE) ×4 IMPLANT
SUT VIC AB 2-0 CT1 27 (SUTURE) ×4
SUT VIC AB 2-0 CT1 TAPERPNT 27 (SUTURE) ×4 IMPLANT
SUT VIC AB 2-0 CTX 27 (SUTURE) ×8 IMPLANT
SUT VIC AB 3-0 SH 27 (SUTURE)
SUT VIC AB 3-0 SH 27X BRD (SUTURE) IMPLANT
SUT VIC AB 3-0 X1 27 (SUTURE) ×8 IMPLANT
SUT VICRYL 4-0 PS2 18IN ABS (SUTURE) IMPLANT
SUTURE E-PAK OPEN HEART (SUTURE) IMPLANT
SYSTEM SAHARA CHEST DRAIN ATS (WOUND CARE) ×4 IMPLANT
TAPE CLOTH SURG 4X10 WHT LF (GAUZE/BANDAGES/DRESSINGS) ×4 IMPLANT
TAPE PAPER 2X10 WHT MICROPORE (GAUZE/BANDAGES/DRESSINGS) ×4 IMPLANT
TOWEL GREEN STERILE (TOWEL DISPOSABLE) ×4 IMPLANT
TOWEL GREEN STERILE FF (TOWEL DISPOSABLE) ×8 IMPLANT
TOWEL OR 17X24 6PK STRL BLUE (TOWEL DISPOSABLE) ×8 IMPLANT
TOWEL OR 17X26 10 PK STRL BLUE (TOWEL DISPOSABLE) ×4 IMPLANT
TRAY CATH LUMEN 1 20CM STRL (SET/KITS/TRAYS/PACK) ×4 IMPLANT
TRAY FOLEY SILVER 16FR TEMP (SET/KITS/TRAYS/PACK) ×4 IMPLANT
TUBE FEEDING 8FR 16IN STR KANG (MISCELLANEOUS) ×4 IMPLANT
TUBING ART PRESS 48 MALE/FEM (TUBING) ×8 IMPLANT
TUBING INSUFFLATION (TUBING) ×4 IMPLANT
UNDERPAD 30X30 (UNDERPADS AND DIAPERS) ×4 IMPLANT
WATER STERILE IRR 1000ML POUR (IV SOLUTION) ×8 IMPLANT

## 2017-07-22 NOTE — Progress Notes (Signed)
RR increased to 14 per MD.  

## 2017-07-22 NOTE — Interval H&P Note (Signed)
History and Physical Interval Note:  07/22/2017 8:26 AM  Jill [name]  has presented today for surgery, with the diagnosis of CAD MR  The various methods of treatment have been discussed with the patient and family. After consideration of risks, benefits and other options for treatment, the patient has consented to  Procedure(s): CORONARY ARTERY BYPASS GRAFTING (CABG) (N/A) MITRAL VALVE REPAIR OR REPLACEMENT  (MVR) (N/A) TRANSESOPHAGEAL ECHOCARDIOGRAM (TEE) (N/A) as a surgical intervention .  The patient's history has been reviewed, patient examined, no change in status, stable for surgery.  I have reviewed the patient's chart and labs.  Questions were answered to the patient's satisfaction.     Loreli Slot

## 2017-07-22 NOTE — Anesthesia Procedure Notes (Signed)
Arterial Line Insertion Start/End10/12/2016 8:00 AM Performed by: Coralee Rud, CRNA  Patient location: Pre-op. Lidocaine 1% used for infiltration Left, radial was placed Catheter size: 20 G Hand hygiene performed  and maximum sterile barriers used   Attempts: 1 (multiple attempts by Elenore Rota and Meyer Cory.  CRNAs) Procedure performed without using ultrasound guided technique. Post procedure assessment: unchanged  Patient tolerated the procedure well with no immediate complications.

## 2017-07-22 NOTE — Anesthesia Procedure Notes (Signed)
Procedure Name: Intubation Date/Time: 07/22/2017 8:45 AM Performed by: Coralee Rud Pre-anesthesia Checklist: Patient identified, Emergency Drugs available, Suction available and Patient being monitored Patient Re-evaluated:Patient Re-evaluated prior to induction Oxygen Delivery Method: Circle system utilized Preoxygenation: Pre-oxygenation with 100% oxygen Induction Type: IV induction Ventilation: Mask ventilation without difficulty Laryngoscope Size: Miller and 3 Grade View: Grade I Tube type: Oral Tube size: 7.0 mm Number of attempts: 1 Airway Equipment and Method: Stylet Placement Confirmation: ETT inserted through vocal cords under direct vision,  positive ETCO2 and breath sounds checked- equal and bilateral Secured at: 22 cm Tube secured with: Tape Dental Injury: Teeth and Oropharynx as per pre-operative assessment

## 2017-07-22 NOTE — Progress Notes (Signed)
Bartle MD paged regarding patient's post-extubation blood gas with CO2 of 57. Orders received to initiate BiPAP therapy and re-check ABG in 1 hour. Will carry out orders.         Marlou Porch

## 2017-07-22 NOTE — Progress Notes (Signed)
Patient ID: Jill [name], female   DOB: 17-Jul-1961, 56 y.o.   MRN: [id number]  TCTS Evening Rounds:   Hemodynamically stable  CI = 2.2 on no drips  Has started to wake up on vent.    Urine output good  CT output low  CBC    Component Value Date/Time   WBC 15.2 (H) 07/22/2017 1514   RBC 3.42 (L) 07/22/2017 1514   HGB 9.5 (L) 07/22/2017 1522   HCT 28.0 (L) 07/22/2017 1522   HCT 27.2 (L) 03/06/2016 0256   PLT 156 07/22/2017 1514   MCV 82.5 07/22/2017 1514   MCH 25.1 (L) 07/22/2017 1514   MCHC 30.5 07/22/2017 1514   RDW 13.8 07/22/2017 1514   LYMPHSABS 2.7 06/18/2017 1057   MONOABS 0.6 06/18/2017 1057   EOSABS 0.4 06/18/2017 1057   BASOSABS 0.1 06/18/2017 1057     BMET    Component Value Date/Time   NA 146 (H) 07/22/2017 1522   K 4.2 07/22/2017 1522   CL 104 07/22/2017 1423   CO2 24 07/20/2017 1213   GLUCOSE 126 (H) 07/22/2017 1522   BUN 31 (H) 07/22/2017 1423   CREATININE 1.00 07/22/2017 1423   CALCIUM 9.2 07/20/2017 1213   GFRNONAA 40 (L) 07/20/2017 1213   GFRAA 46 (L) 07/20/2017 1213     A/P:  Stable postop course. Continue current plans

## 2017-07-22 NOTE — Transfer of Care (Signed)
Immediate Anesthesia Transfer of Care Note  Patient: Jill [name]  Procedure(s) Performed: CORONARY ARTERY BYPASS GRAFTING (CABG) x four, using left internal mammary artery and right leg greater saphenous vein harvested endoscopically (N/A Chest) TRANSESOPHAGEAL ECHOCARDIOGRAM (TEE) (N/A )  Patient Location: SICU  Anesthesia Type:General  Level of Consciousness: Patient remains intubated per anesthesia plan  Airway & Oxygen Therapy: Patient remains intubated per anesthesia plan and Patient placed on Ventilator (see vital sign flow sheet for setting)  Post-op Assessment: Report given to RN and Post -op Vital signs reviewed and stable  Post vital signs: Reviewed and stable  Last Vitals:  Vitals:   07/22/17 0625  BP: 138/70  Pulse: 86  Resp: 20  Temp: 36.8 C  SpO2: 99%    Last Pain:  Vitals:   07/22/17 0625  TempSrc: Oral         Complications: No apparent anesthesia complications

## 2017-07-22 NOTE — Anesthesia Preprocedure Evaluation (Signed)
Anesthesia Evaluation  Patient identified by MRN, date of birth, ID band Patient awake    Reviewed: Allergy & Precautions, NPO status , Patient's Chart, lab work & pertinent test results  Airway Mallampati: II  TM Distance: >3 FB Neck ROM: Full    Dental   Pulmonary    Pulmonary exam normal        Cardiovascular hypertension, Pt. on medications + CAD  Normal cardiovascular exam     Neuro/Psych    GI/Hepatic   Endo/Other  diabetes, Type 2, Oral Hypoglycemic Agents  Renal/GU      Musculoskeletal   Abdominal   Peds  Hematology   Anesthesia Other Findings   Reproductive/Obstetrics                             Anesthesia Physical Anesthesia Plan  ASA: III  Anesthesia Plan: General   Post-op Pain Management:    Induction: Intravenous  PONV Risk Score and Plan: 3 and Ondansetron, Dexamethasone, Midazolam and Treatment may vary due to age or medical condition  Airway Management Planned: Oral ETT  Additional Equipment: Arterial line, CVP, PA Cath, TEE and Ultrasound Guidance Line Placement  Intra-op Plan:   Post-operative Plan: Post-operative intubation/ventilation  Informed Consent: I have reviewed the patients History and Physical, chart, labs and discussed the procedure including the risks, benefits and alternatives for the proposed anesthesia with the patient or authorized representative who has indicated his/her understanding and acceptance.     Plan Discussed with: CRNA and Surgeon  Anesthesia Plan Comments:         Anesthesia Quick Evaluation

## 2017-07-22 NOTE — OR Nursing (Signed)
14:10 - 45 minute call to SICU 14:40 - 20 minute call to SICU

## 2017-07-22 NOTE — H&P (View-Only) (Signed)
PCP is [facility] Referring Provider is [facility]  Chief Complaint  Patient presents with  . Coronary Artery Disease    F/u from hospital consult 06/27/2017  . Mitral Regurgitation    HPI: Jill [name] is an 56 year old woman who I saw the hospital recently regarding coronary disease and mitral regurgitation. She now returns to discuss scheduling surgery.  Jill [name] is a 56 year old woman with a past medical history significant for type 2 diabetes complicated by neuropathy and diabetic foot ulcer, hypertension, and hyperlipidemia. [facility] late August for her diabetic foot ulcerations. A basic metabolic panel was done and her potassium was greater than 7.5. She went to the ER the following morning and a repeat potassium was again greater than 7.5 and she was admitted to the hospital. She mentioned to the doctor that she had been having a burning sensation in her chest with exertion. An EKG showed T-wave inversions in V4 through 6, III, and aVF. An echocardiogram showed an ejection fraction of 45-50% with moderate to severe mitral regurgitation. She was transferred to [facility]. A TEE showed moderate to severe MR. The catheterization she was found to have three-vessel coronary disease.  She was seen and evaluated by the dental service and cleared for surgery.  Since discharge she continues to feel fatigued. She continues to get short of breath and a vague burning sensation in her chest with exertion. She had an episode while walking from the parking lot to the elevator in her office. She denies any orthopnea or peripheral edema. She has not had rest or nocturnal symptoms.   Past Medical History:  Diagnosis Date  . Diabetic neuropathy (HCC)   . Hyperlipidemia   . Hypertension   . Type 2 diabetes mellitus (HCC)     Past Surgical History:  Procedure Laterality Date  . RIGHT/LEFT HEART CATH AND CORONARY  ANGIOGRAPHY N/A 06/26/2017   Procedure: RIGHT/LEFT HEART CATH AND CORONARY ANGIOGRAPHY;  Surgeon: Marykay Lex, MD;  Location: [facility] INVASIVE CV LAB;  Service: Cardiovascular;  Laterality: N/A;  . TEE WITHOUT CARDIOVERSION N/A 06/25/2017   Procedure: TRANSESOPHAGEAL ECHOCARDIOGRAM (TEE);  Surgeon: Thurmon Fair, MD;  Location: [facility] ENDOSCOPY;  Service: Cardiovascular;  Laterality: N/A;  . TUBAL LIGATION      Family History  Problem Relation Age of Onset  . Diabetes type II Other   . Hypertension Other     Social History Social History  Substance Use Topics  . Smoking status: Never Smoker  . Smokeless tobacco: Never Used  . Alcohol use No    Current Outpatient Prescriptions  Medication Sig Dispense Refill  . aspirin EC 81 MG tablet Take 81 mg by mouth daily.    . benzonatate (TESSALON) 100 MG capsule Take by mouth 3 (three) times daily as needed for cough.    . Dulaglutide (TRULICITY) 0.75 MG/0.5ML SOPN Inject 0.75 mg into the skin once a week.    . ferrous sulfate 325 (65 FE) MG EC tablet Take 325 mg by mouth daily with breakfast.    . furosemide (LASIX) 40 MG tablet Take 1 tablet (40 mg total) by mouth daily. 30 tablet 0  . gabapentin (NEURONTIN) 400 MG capsule Take 400 mg by mouth 3 (three) times daily.    . insulin detemir (LEVEMIR) 100 UNIT/ML injection Inject 25 Units into the skin 2 (two) times daily.     Marland Kitchen loratadine (CLARITIN) 10 MG tablet Take 10 mg by mouth daily.    Marland Kitchen  metoprolol succinate (TOPROL-XL) 25 MG 24 hr tablet Take 0.5 tablets (12.5 mg total) by mouth daily. 60 tablet 0  . rosuvastatin (CRESTOR) 20 MG tablet Take 20 mg by mouth daily.    Marland Kitchen senna-docusate (SENOKOT-S) 8.6-50 MG tablet Take 1 tablet by mouth at bedtime. 30 tablet 0  . sitaGLIPtin-metformin (JANUMET) 50-1000 MG tablet Take 1 tablet by mouth 2 (two) times daily with a meal.    . vitamin C (ASCORBIC ACID) 500 MG tablet Take 500 mg by mouth daily.     No current facility-administered medications for  this visit.     No Known Allergies  Review of Systems  Constitutional: Positive for activity change and fatigue. Negative for chills and fever.  HENT: Negative for trouble swallowing and voice change.   Eyes: Negative for visual disturbance.  Respiratory: Positive for chest tightness and shortness of breath.   Cardiovascular: Positive for chest pain. Negative for palpitations and leg swelling.  Gastrointestinal: Negative for abdominal pain and blood in stool.  Genitourinary: Negative for difficulty urinating and dysuria.  Musculoskeletal: Negative for arthralgias and myalgias.  Neurological: Positive for numbness (Both feet). Negative for dizziness and syncope.  Hematological: Negative for adenopathy. Does not bruise/bleed easily.  All other systems reviewed and are negative.   BP 97/65   Pulse 80   Resp 20   Ht  (1.6 m)   Wt 225 lb (102.1 kg)   SpO2 97% Comment: RA  BMI 39.86 kg/m  Physical Exam  Constitutional: She is oriented to person, place, and time. No distress.  Obese  HENT:  Head: Normocephalic and atraumatic.  Mouth/Throat: No oropharyngeal exudate.  Eyes: Conjunctivae and EOM are normal. No scleral icterus.  Neck: Neck supple. No thyromegaly present.  No carotid bruits  Cardiovascular: Normal rate, regular rhythm and intact distal pulses.   Murmur (2/6 systolic murmur left lower sternal border) heard. Pulmonary/Chest: Effort normal and breath sounds normal. No respiratory distress. She has no wheezes. She has no rales.  Abdominal: Soft. She exhibits no distension. There is no tenderness.  Musculoskeletal: She exhibits no edema or deformity.  Lymphadenopathy:    She has no cervical adenopathy.  Neurological: She is alert and oriented to person, place, and time. No cranial nerve deficit. Coordination normal.  Motor 5/5 all extremities  Skin: Skin is warm and dry.  Foot ulcer healing well no erythema  Vitals reviewed.    Diagnostic Tests: Study  Conclusions  - Left ventricle: Systolic function was mildly reduced. The   estimated ejection fraction was in the range of 45% to 50%. Mild   diffuse hypokinesis with no identifiable regional variations. - Aortic valve: No evidence of vegetation. - Mitral valve: Thickening, consistent with rheumatic disease. No   evidence of vegetation. There was moderate regurgitation directed   centrally, eccentrically, and anteriorly. - Left atrium: The atrium was moderately dilated. No evidence of   thrombus in the atrial cavity or appendage. There was spontaneous   echo contrast (&quot;smoke&quot;). - Right atrium: No evidence of thrombus in the atrial cavity or   appendage. No evidence of thrombus in the atrial cavity or   appendage. - Atrial septum: No defect or patent foramen ovale was identified. - Tricuspid valve: No evidence of vegetation. There was   mild-moderate regurgitation directed centrally. - Pulmonic valve: No evidence of vegetation. No evidence of   vegetation. - Pulmonary arteries: Systolic pressure was moderately increased.   PA peak pressure: 59 mm Hg (S).  Impressions:  - Although  the MR jet is eccentric and could be underestimated by   color Doppler, there is consistent evidence for moderate severity   based on continuity equation-derived regurgitant fraction,   PISA-derived effective regurgitant orifice area and absence of   pulmonary vein systolic flow reversal. Conclusion     Prox RCA lesion, 70 %stenosed. Mid RCA lesion, 40 %stenosed. Dist RCA lesion, 40 %stenosed.  Ost 2nd Diag to 2nd Diag lesion, 90 %stenosed.  Cx-OM1 bifurcation: Prox Cx lesion, 80 %stenosed. Mid Cx lesion, 100 %stenosed. Ost 1st Mrg lesion, 70 %stenosed.  There is moderate left ventricular systolic dysfunction. The left ventricular ejection fraction is 35-45% by visual estimate.  LV end diastolic pressure is severely elevated.  There is moderate (3+) mitral regurgitation by LV Gram,  but Lage V wave on PCWP would suggest Severe MR.  There is no aortic valve stenosis.  Hemodynamic findings consistent with severe pulmonary hypertension. Mostly Secondary to MR & elevated LVEDP.   Severe 2 Vessel disease (moderate to severe RCA and one or percent mid circumflex-OM occlusion) along with severe diffusely diseased very small caliber Diag branches and mild diffuse disease throughout the LAD.  Most likely at least moderate-severe mitral regurgitation based on large V wave.  Recommend CV surgery consultation to determine the best course of action would treating the patient's mitral valve and CAD.  She will return to nursing unit for ongoing care.   Bryan Lemma, M.D., M.S.   I personally reviewed the images again. I have previously reviewed the echocardiogram images.There is moderately severe MR. In my opinion there is three-vessel coronary disease although only moderate disease in the LAD.  Impression: Jill [name] is a 56 year old Hispanic woman with past medical history significant for type 2 diabetes complicated by diabetic neuropathy, hypertension, hyperlipidemia, and obesity. She has three-vessel coronary disease and moderately severe mitral regurgitation. She has severe disease in a diagonal branch the LAD and the circumflex. There is moderate disease in the LAD and right coronary. Treatment options include medical therapy versus surgery for coronary bypass grafting and mitral valve repair or replacement. She currently is on a good medical regimen but continues to have significant symptoms which are limiting to her. I think she will do much better in both the short and long-term with revascularization and correction of her mitral regurgitation.  I offered her the option of coronary bypass grafting and mitral valve repair or replacement. I discussed the indications, risks, benefits, and alternatives with Jill [name] her husband and her son. We discussed the general  nature of the procedure including the incisions to be used, the need for general anesthesia, the use of cardiopulmonary bypass, the expected hospital stay, and the overall recovery. They understand the risks include, but are not limited to death, MI, stroke, DVT, PE, bleeding, possible need for transfusion, infection, complete heart block requiring pacemaker, cardiac arrhythmias, as well as the possibility of gastrointestinal complications, renal or respiratory failure.  She understands and accepts the risks and wishes to proceed.  Plan: Coronary artery bypass grafting and mitral valve repair or replacement on Wednesday, 07/22/2017  Loreli Slot, MD [facility] [phone]

## 2017-07-22 NOTE — Progress Notes (Signed)
Dr. Sandford Craze notified of CBG of 243; no orders received.

## 2017-07-22 NOTE — Brief Op Note (Addendum)
07/22/2017  3:15 PM  PATIENT:  Jill [name]  56 y.o. female  PRE-OPERATIVE DIAGNOSIS:  CAD, moderately severe MR  POST-OPERATIVE DIAGNOSIS:  CAD, mild MR  PROCEDURE:  Procedure(s):  CORONARY ARTERY BYPASS GRAFTING x 4 -LIMA to LAD -SVG to DIAGONAL -SVG to OM2 -SVG to PDA  ENDOSCOPIC HARVEST GREATER SAPHENOUS VEIN -Right Leg -Left Leg Explored  TRANSESOPHAGEAL ECHOCARDIOGRAM (TEE) (N/A)  SURGEON:  Surgeon(s) and Role:    * Loreli Slot, MD - Primary  PHYSICIAN ASSISTANT: Lowella Dandy PA-C  ANESTHESIA:   general  EBL:  Total I/O In: 2000 [I.V.:1800; Blood:200] Out: 3338 [Urine:1850; Blood:1488]  BLOOD ADMINISTERED:2U PRBC and  CELLSAVER  DRAINS: Left Pleural Chest Tube, Mediastinal Chest Drains, Jp Drain right leg   LOCAL MEDICATIONS USED:  NONE  SPECIMEN:  No Specimen  DISPOSITION OF SPECIMEN:  N/A  COUNTS:  YES  TOURNIQUET:  * No tourniquets in log *  DICTATION: .Dragon Dictation  PLAN OF CARE: Admit to inpatient   PATIENT DISPOSITION:  ICU - intubated and hemodynamically stable.   Delay start of Pharmacological VTE agent (>24hrs) due to surgical blood loss or risk of bleeding: yes   XC= 71 min CPB= 116 min TEE showed only 1+ MR Diffusely diseased poor quality targets, good conduits

## 2017-07-22 NOTE — Procedures (Signed)
Extubation Procedure Note  Patient Details:   Name: Jill [name] DOB: Oct 27, 1960 MRN: [id number]   Airway Documentation:  Airway 7 mm (Active)  Secured at (cm) 22 cm 07/22/2017  7:20 PM  Measured From Lips 07/22/2017  7:20 PM  Secured Location Right 07/22/2017  7:20 PM  Secured By Pink Tape 07/22/2017  7:20 PM  Site Condition Dry 07/22/2017  7:20 PM    Evaluation  O2 sats: stable throughout Complications: No apparent complications Patient did tolerate procedure well. Bilateral Breath Sounds: Clear, Diminished   Yes   Pt was extubated per rapid wean protocol. Pt performed NIF of -25, VC 700 ml. Pt had positive cuff leak and was able to perform IS of 450. Pt placed on 4L at this time.     Waldon Reining Agee 07/22/2017, 8:08 PM

## 2017-07-22 NOTE — Anesthesia Preprocedure Evaluation (Signed)
Anesthesia Evaluation  Patient identified by MRN, date of birth, ID band Patient awake    Reviewed: Allergy & Precautions, NPO status , Patient's Chart, lab work & pertinent test results, reviewed documented beta blocker date and time   History of Anesthesia Complications Negative for: history of anesthetic complications  Airway Mallampati: II  TM Distance: <3 FB Neck ROM: Full    Dental  (+) Dental Advisory Given   Pulmonary shortness of breath and Long-Term Oxygen Therapy,    breath sounds clear to auscultation       Cardiovascular hypertension, Pt. on medications and Pt. on home beta blockers + CAD (2v ASCAD)  + Valvular Problems/Murmurs (severe MR) MR  Rhythm:Regular Rate:Normal     Neuro/Psych  Headaches,    GI/Hepatic negative GI ROS, Neg liver ROS,   Endo/Other  diabetes, Oral Hypoglycemic Agents, Insulin DependentMorbid obesity  Renal/GU Renal InsufficiencyRenal disease (creat 1.44)     Musculoskeletal   Abdominal (+) + obese,   Peds  Hematology negative hematology ROS (+)   Anesthesia Other Findings   Reproductive/Obstetrics                             Anesthesia Physical Anesthesia Plan  ASA: IV  Anesthesia Plan: General   Post-op Pain Management:    Induction: Intravenous  PONV Risk Score and Plan: 3 and Treatment may vary due to age or medical condition and Midazolam  Airway Management Planned: Oral ETT  Additional Equipment: Arterial line, PA Cath, TEE and Ultrasound Guidance Line Placement  Intra-op Plan:   Post-operative Plan: Post-operative intubation/ventilation  Informed Consent: I have reviewed the patients History and Physical, chart, labs and discussed the procedure including the risks, benefits and alternatives for the proposed anesthesia with the patient or authorized representative who has indicated his/her understanding and acceptance.   Dental  advisory given  Plan Discussed with: CRNA and Surgeon  Anesthesia Plan Comments:         Anesthesia Quick Evaluation

## 2017-07-22 NOTE — Anesthesia Procedure Notes (Signed)
Central Venous Catheter Insertion Performed by: Annye Asa, anesthesiologist Start/End10/12/2016 7:56 AM, 07/22/2017 8:09 AM Patient location: Pre-op. Preanesthetic checklist: patient identified, IV checked, risks and benefits discussed, surgical consent, monitors and equipment checked, pre-op evaluation, timeout performed and anesthesia consent Position: Trendelenburg Lidocaine 1% used for infiltration and patient sedated Hand hygiene performed , maximum sterile barriers used  and Seldinger technique used Catheter size: 8.5 Fr PA cath was placed.Sheath introducer Swan type:thermodilution Procedure performed using ultrasound guided technique. Ultrasound Notes:anatomy identified, needle tip was noted to be adjacent to the nerve/plexus identified and no ultrasound evidence of intravascular and/or intraneural injection Attempts: 1 Following insertion, line sutured, dressing applied and Biopatch. Post procedure assessment: blood return through all ports, free fluid flow and no air  Patient tolerated the procedure well with no immediate complications. Additional procedure comments: PA catheter:  Routine monitors. Timeout, sterile prep, drape, FBP R neck.  Trendelenburg position.  1% Lido local, finder and trocar RIJ 1st pass with US guidance.  Cordis placed over J wire. PA catheter in easily.  Sterile dressing applied.  Patient tolerated well, VSS.  Jenita Seashore, MD.

## 2017-07-23 ENCOUNTER — Inpatient Hospital Stay (HOSPITAL_COMMUNITY): Payer: Medicaid Other

## 2017-07-23 ENCOUNTER — Ambulatory Visit (HOSPITAL_COMMUNITY): Payer: Self-pay | Admitting: Physical Therapy

## 2017-07-23 ENCOUNTER — Encounter (HOSPITAL_COMMUNITY): Payer: Self-pay | Admitting: Thoracic Surgery (Cardiothoracic Vascular Surgery)

## 2017-07-23 LAB — GLUCOSE, CAPILLARY
GLUCOSE-CAPILLARY: 110 mg/dL — AB (ref 65–99)
GLUCOSE-CAPILLARY: 111 mg/dL — AB (ref 65–99)
GLUCOSE-CAPILLARY: 112 mg/dL — AB (ref 65–99)
GLUCOSE-CAPILLARY: 125 mg/dL — AB (ref 65–99)
GLUCOSE-CAPILLARY: 134 mg/dL — AB (ref 65–99)
GLUCOSE-CAPILLARY: 182 mg/dL — AB (ref 65–99)
Glucose-Capillary: 104 mg/dL — ABNORMAL HIGH (ref 65–99)
Glucose-Capillary: 107 mg/dL — ABNORMAL HIGH (ref 65–99)
Glucose-Capillary: 108 mg/dL — ABNORMAL HIGH (ref 65–99)
Glucose-Capillary: 113 mg/dL — ABNORMAL HIGH (ref 65–99)
Glucose-Capillary: 119 mg/dL — ABNORMAL HIGH (ref 65–99)
Glucose-Capillary: 120 mg/dL — ABNORMAL HIGH (ref 65–99)
Glucose-Capillary: 124 mg/dL — ABNORMAL HIGH (ref 65–99)
Glucose-Capillary: 139 mg/dL — ABNORMAL HIGH (ref 65–99)

## 2017-07-23 LAB — MAGNESIUM
Magnesium: 2.8 mg/dL — ABNORMAL HIGH (ref 1.7–2.4)
Magnesium: 2.5 mg/dL — ABNORMAL HIGH (ref 1.7–2.4)

## 2017-07-23 LAB — CBC
HEMATOCRIT: 26.6 % — AB (ref 36.0–46.0)
HEMATOCRIT: 24.2 % — AB (ref 36.0–46.0)
HEMOGLOBIN: 8.3 g/dL — AB (ref 12.0–15.0)
HEMOGLOBIN: 7.3 g/dL — AB (ref 12.0–15.0)
MCH: 25.9 pg — AB (ref 26.0–34.0)
MCH: 25.5 pg — AB (ref 26.0–34.0)
MCHC: 31.2 g/dL (ref 30.0–36.0)
MCHC: 30.2 g/dL (ref 30.0–36.0)
MCV: 83.1 fL (ref 78.0–100.0)
MCV: 84.6 fL (ref 78.0–100.0)
Platelets: 164 10*3/uL (ref 150–400)
Platelets: 150 10*3/uL (ref 150–400)
RBC: 3.2 MIL/uL — ABNORMAL LOW (ref 3.87–5.11)
RBC: 2.86 MIL/uL — ABNORMAL LOW (ref 3.87–5.11)
RDW: 14.2 % (ref 11.5–15.5)
RDW: 14.5 % (ref 11.5–15.5)
WBC: 11.3 10*3/uL — ABNORMAL HIGH (ref 4.0–10.5)
WBC: 10.8 10*3/uL — ABNORMAL HIGH (ref 4.0–10.5)

## 2017-07-23 LAB — POCT I-STAT, CHEM 8
BUN: 23 mg/dL — ABNORMAL HIGH (ref 6–20)
CALCIUM ION: 1.22 mmol/L (ref 1.15–1.40)
Chloride: 104 mmol/L (ref 101–111)
Creatinine, Ser: 1.1 mg/dL — ABNORMAL HIGH (ref 0.44–1.00)
GLUCOSE: 195 mg/dL — AB (ref 65–99)
HCT: 22 % — ABNORMAL LOW (ref 36.0–46.0)
HEMOGLOBIN: 7.5 g/dL — AB (ref 12.0–15.0)
Potassium: 4.8 mmol/L (ref 3.5–5.1)
SODIUM: 140 mmol/L (ref 135–145)
TCO2: 23 mmol/L (ref 22–32)

## 2017-07-23 LAB — POCT I-STAT 3, ART BLOOD GAS (G3+)
ACID-BASE EXCESS: 1 mmol/L (ref 0.0–2.0)
Acid-base deficit: 2 mmol/L (ref 0.0–2.0)
BICARBONATE: 27 mmol/L (ref 20.0–28.0)
Bicarbonate: 23.7 mmol/L (ref 20.0–28.0)
O2 Saturation: 99 %
O2 Saturation: 96 %
PCO2 ART: 47.3 mmHg (ref 32.0–48.0)
PH ART: 7.311 — AB (ref 7.350–7.450)
PO2 ART: 91 mmHg (ref 83.0–108.0)
Patient temperature: 37.6
TCO2: 28 mmol/L (ref 22–32)
TCO2: 25 mmol/L (ref 22–32)
pCO2 arterial: 51.8 mmHg — ABNORMAL HIGH (ref 32.0–48.0)
pH, Arterial: 7.332 — ABNORMAL LOW (ref 7.350–7.450)
pO2, Arterial: 135 mmHg — ABNORMAL HIGH (ref 83.0–108.0)

## 2017-07-23 LAB — BASIC METABOLIC PANEL
Anion gap: 6 (ref 5–15)
BUN: 23 mg/dL — ABNORMAL HIGH (ref 6–20)
CALCIUM: 8.2 mg/dL — AB (ref 8.9–10.3)
CHLORIDE: 109 mmol/L (ref 101–111)
CO2: 25 mmol/L (ref 22–32)
CREATININE: 1.06 mg/dL — AB (ref 0.44–1.00)
GFR calc non Af Amer: 58 mL/min — ABNORMAL LOW (ref >60)
GLUCOSE: 105 mg/dL — AB (ref 65–99)
Potassium: 4.6 mmol/L (ref 3.5–5.1)
Sodium: 140 mmol/L (ref 135–145)

## 2017-07-23 LAB — CREATININE, SERUM
CREATININE: 1.28 mg/dL — AB (ref 0.44–1.00)
GFR calc Af Amer: 53 mL/min — ABNORMAL LOW (ref >60)
GFR calc non Af Amer: 46 mL/min — ABNORMAL LOW (ref >60)

## 2017-07-23 MED ORDER — INSULIN ASPART 100 UNIT/ML ~~LOC~~ SOLN: 0.0000 [IU] | SUBCUTANEOUS | Status: DC

## 2017-07-23 MED ORDER — CHLORHEXIDINE GLUCONATE 0.12 % MT SOLN
15.0000 mL | Freq: Two times a day (BID) | OROMUCOSAL | Status: DC
  Administered 2017-07-23 – 2017-07-28 (×8): 15 mL via OROMUCOSAL
  Filled 2017-07-23 (×6): qty 15

## 2017-07-23 MED ORDER — INSULIN DETEMIR 100 UNIT/ML ~~LOC~~ SOLN
20.0000 [IU] | Freq: Every day | SUBCUTANEOUS | Status: DC
  Filled 2017-07-23: qty 0.2

## 2017-07-23 MED ORDER — ORAL CARE MOUTH RINSE
15.0000 mL | Freq: Two times a day (BID) | OROMUCOSAL | Status: DC
  Administered 2017-07-24: 15 mL via OROMUCOSAL

## 2017-07-23 MED ORDER — INSULIN ASPART 100 UNIT/ML ~~LOC~~ SOLN
0.0000 [IU] | SUBCUTANEOUS | Status: DC
  Administered 2017-07-23 (×2): 4 [IU] via SUBCUTANEOUS
  Administered 2017-07-24: 2 [IU] via SUBCUTANEOUS

## 2017-07-23 MED ORDER — ENOXAPARIN SODIUM 40 MG/0.4ML ~~LOC~~ SOLN
40.0000 mg | Freq: Every day | SUBCUTANEOUS | Status: DC
  Administered 2017-07-23 – 2017-07-27 (×5): 40 mg via SUBCUTANEOUS
  Filled 2017-07-23 (×6): qty 0.4

## 2017-07-23 MED ORDER — FUROSEMIDE 10 MG/ML IJ SOLN
40.0000 mg | Freq: Once | INTRAMUSCULAR | Status: AC
  Administered 2017-07-23: 40 mg via INTRAVENOUS
  Filled 2017-07-23: qty 4

## 2017-07-23 MED ORDER — INSULIN DETEMIR 100 UNIT/ML ~~LOC~~ SOLN
20.0000 [IU] | Freq: Two times a day (BID) | SUBCUTANEOUS | Status: DC
  Administered 2017-07-23 (×2): 20 [IU] via SUBCUTANEOUS
  Filled 2017-07-23 (×3): qty 0.2

## 2017-07-23 MED ORDER — INSULIN ASPART 100 UNIT/ML ~~LOC~~ SOLN: 3.0000 [IU] | Freq: Three times a day (TID) | SUBCUTANEOUS | Status: DC

## 2017-07-23 MED FILL — Mannitol IV Soln 20%: INTRAVENOUS | Qty: 500 | Status: AC

## 2017-07-23 MED FILL — Lidocaine HCl IV Inj 20 MG/ML: INTRAVENOUS | Qty: 5 | Status: AC

## 2017-07-23 MED FILL — Electrolyte-R (PH 7.4) Solution: INTRAVENOUS | Qty: 3000 | Status: AC

## 2017-07-23 MED FILL — Sodium Bicarbonate IV Soln 8.4%: INTRAVENOUS | Qty: 50 | Status: AC

## 2017-07-23 MED FILL — Sodium Chloride IV Soln 0.9%: INTRAVENOUS | Qty: 2000 | Status: AC

## 2017-07-23 NOTE — Progress Notes (Signed)
1 Day Post-Op Procedure(s) (LRB): CORONARY ARTERY BYPASS GRAFTING (CABG) x four, using left internal mammary artery and right leg greater saphenous vein harvested endoscopically (N/A) TRANSESOPHAGEAL ECHOCARDIOGRAM (TEE) (N/A) Subjective: No complaints, denies pain  Objective: Vital signs in last 24 hours: Temp:  [95.9 F (35.5 C)-101.8 F (38.8 C)] 99 F (37.2 C) (10/04 0800) Pulse Rate:  [88-108] 89 (10/04 0800) Cardiac Rhythm: A-V Sequential paced (10/04 0800) Resp:  [0-28] 20 (10/04 0800) BP: (96-142)/(50-120) 103/50 (10/04 0800) SpO2:  [94 %-100 %] 99 % (10/04 0800) Arterial Line BP: (61-153)/(35-68) 101/47 (10/04 0800) FiO2 (%):  [30 %-50 %] 30 % (10/04 0733) Weight:  [223 lb 8 oz (101.4 kg)] 223 lb 8 oz (101.4 kg) (10/04 0430)  Hemodynamic parameters for last 24 hours: PAP: (26-44)/(10-22) 32/14 CO:  [3.3 L/min-9.3 L/min] 4.9 L/min CI:  [1.7 L/min/m2-4.7 L/min/m2] 2.5 L/min/m2  Intake/Output from previous day: 10/03 0701 - 10/04 0700 In: 6045.5 [I.V.:2935.5; Blood:200; NG/GT:60; IV Piggyback:2850] Out: 5733 [Urine:3765; Drains:160; Blood:1488; Chest Tube:320] Intake/Output this shift: Total I/O In: 73.2 [I.V.:73.2] Out: 130 [Urine:120; Drains:10]  General appearance: alert, cooperative and no distress Neurologic: intact Heart: regular rate and rhythm Lungs: diminished breath sounds bibasilar Abdomen: normal findings: soft, non-tender + friction rub on cardiac exam  Lab Results:  Recent Labs  07/22/17 2043 07/22/17 2100 07/23/17 0243  WBC 10.9*  --  11.3*  HGB 8.2* 8.2* 8.3*  HCT 27.1* 24.0* 26.6*  PLT 162  --  164   BMET:  Recent Labs  07/20/17 1213  07/22/17 2100 07/23/17 0243  NA 137  < > 142 140  K 4.6  < > 5.1 4.6  CL 100*  < > 110 109  CO2 24  --   --  25  GLUCOSE 195*  < > 158* 105*  BUN 42*  < > 26* 23*  CREATININE 1.44*  < > 1.00 1.06*  CALCIUM 9.2  --   --  8.2*  < > = values in this interval not displayed.  PT/INR:  Recent Labs  07/22/17 1514  LABPROT 17.5*  INR 1.45   ABG    Component Value Date/Time   PHART 7.311 (L) 07/23/2017 0501   HCO3 23.7 07/23/2017 0501   TCO2 25 07/23/2017 0501   ACIDBASEDEF 2.0 07/23/2017 0501   O2SAT 96.0 07/23/2017 0501   CBG (last 3)   Recent Labs  07/23/17 0553 07/23/17 0650 07/23/17 0747  GLUCAP 134* 125* 104*    Assessment/Plan: S/P Procedure(s) (LRB): CORONARY ARTERY BYPASS GRAFTING (CABG) x four, using left internal mammary artery and right leg greater saphenous vein harvested endoscopically (N/A) TRANSESOPHAGEAL ECHOCARDIOGRAM (TEE) (N/A) -  CV- good hemodynamics- dc swan and A line  ASA, statin, beta blocker  RESP- IS for atelectasis  RENAL- creatinine OK, diurese  ENDO_ transition to levemir + SSI  Anemia- acute secondary to ABL on chronic  SCD + enoxaparin for DVT prophylaxis  DC chest tubes  Mobilize    LOS: 1 day    Loreli Slot 07/23/2017

## 2017-07-23 NOTE — Anesthesia Postprocedure Evaluation (Signed)
Anesthesia Post Note  Patient: Jill [name]  Procedure(s) Performed: CORONARY ARTERY BYPASS GRAFTING (CABG) x four, using left internal mammary artery and right leg greater saphenous vein harvested endoscopically (N/A Chest) TRANSESOPHAGEAL ECHOCARDIOGRAM (TEE) (N/A )     Patient location during evaluation: SICU Anesthesia Type: General Level of consciousness: sedated Pain management: pain level controlled Vital Signs Assessment: post-procedure vital signs reviewed and stable Respiratory status: patient remains intubated per anesthesia plan Cardiovascular status: stable Postop Assessment: no apparent nausea or vomiting Anesthetic complications: no    Last Vitals:  Vitals:   07/23/17 1815 07/23/17 1830  BP: (!) 106/58 (!) 102/55  Pulse: 82 81  Resp: 13 17  Temp:    SpO2: 100% 100%    Last Pain:  Vitals:   07/23/17 1812  TempSrc:   PainSc: 0-No pain                 Briaunna Grindstaff DAVID

## 2017-07-23 NOTE — Progress Notes (Signed)
Patient ID: Jill [name], female   DOB: 1961-01-05, 56 y.o.   MRN: [id number] EVENING ROUNDS NOTE :     [address]       [place] [address]             [phone]                 1 Day Post-Op Procedure(s) (LRB): CORONARY ARTERY BYPASS GRAFTING (CABG) x four, using left internal mammary artery and right leg greater saphenous vein harvested endoscopically (N/A) TRANSESOPHAGEAL ECHOCARDIOGRAM (TEE) (N/A)  Total Length of Stay:  LOS: 1 day  BP (!) 112/57   Pulse 81   Temp 99 F (37.2 C) (Oral)   Resp 15   Ht 5\' 3"  (1.6 m)   Wt 223 lb 8 oz (101.4 kg)   SpO2 100%   BMI 39.59 kg/m   .Intake/Output      10/04 0701 - 10/05 0700   P.O. 1020   I.V. (mL/kg) 380.6 (3.8)   IV Piggyback 50   Total Intake(mL/kg) 1450.6 (14.3)   Urine (mL/kg/hr) 433 (0.3)   Drains 15   Chest Tube 10   Total Output 458   Net +992.6         . sodium chloride Stopped (07/23/17 0850)  . sodium chloride    . sodium chloride 20 mL/hr at 07/23/17 1900  . cefUROXime (ZINACEF)  IV Stopped (07/23/17 1750)  . dexmedetomidine (PRECEDEX) IV infusion Stopped (07/22/17 1800)  . DOPamine Stopped (07/22/17 1515)  . lactated ringers    . lactated ringers    . lactated ringers Stopped (07/23/17 1723)  . nitroGLYCERIN Stopped (07/22/17 2305)  . phenylephrine (NEO-SYNEPHRINE) Adult infusion 20 mcg/min (07/23/17 1900)     Lab Results  Component Value Date   WBC 10.8 (H) 07/23/2017   HGB 7.5 (L) 07/23/2017   HCT 22.0 (L) 07/23/2017   PLT 150 07/23/2017   GLUCOSE 195 (H) 07/23/2017   CHOL 105 06/25/2017   TRIG 69 06/25/2017   HDL 33 (L) 06/25/2017   LDLCALC 58 06/25/2017   ALT 15 07/20/2017   AST 18 07/20/2017   NA 140 07/23/2017   K 4.8 07/23/2017   CL 104 07/23/2017   CREATININE 1.10 (H) 07/23/2017   BUN 23 (H) 07/23/2017   CO2 25 07/23/2017   INR 1.45 07/22/2017   HGBA1C 9.1 (H) 07/20/2017   Stable day Cr ok H/h low  Delight Ovens MD  Beeper [phone] Office  [phone] 07/23/2017 7:55 PM

## 2017-07-23 NOTE — Care Management Note (Signed)
Case Management Note Donn Pierini RN, BSN Unit 4E-Case Manager-- 2H coverage [phone]  Patient Details  Name: Jill [name] MRN: [id number] Date of Birth: 13-Jul-1961  Subjective/Objective:  Pt admitted s/p CABGx4 on 07/22/17-                   Action/Plan: PTA pt lived at home- followed at the [facility] for primary care- pt may need MATCH at discharge- CM to follow and review meds prior to discharge for assistance needs.   Expected Discharge Date:                  Expected Discharge Plan:     In-House Referral:     Discharge planning Services  Medication Assistance  Post Acute Care Choice:    Choice offered to:     DME Arranged:    DME Agency:     HH Arranged:    HH Agency:     Status of Service:  In process, will continue to follow  If discussed at Long Length of Stay Meetings, dates discussed:    Discharge Disposition:   Additional Comments:  Darrold Span, RN 07/23/2017, 10:28 AM

## 2017-07-23 NOTE — Op Note (Signed)
Jill [name], [name]              ACCOUNT NO.:  [id number]  MEDICAL RECORD NO.:  [id number]  LOCATION:                                 FACILITY:  PHYSICIAN:  Salvatore Decent. Dorris Fetch, M.D.DATE OF BIRTH:  10-29-1960  DATE OF PROCEDURE:  07/22/2017 DATE OF DISCHARGE:                              OPERATIVE REPORT   PREOPERATIVE DIAGNOSIS:  Severe 3-vessel coronary artery disease with moderate to severe mitral regurgitation.  POSTOPERATIVE DIAGNOSIS:  Severe coronary artery disease with mild mitral regurgitation.  PROCEDURES:   Median sternotomy, extracorporeal circulation, Coronary artery bypass grafting x 4  Left internal mammary artery to left anterior descending,  Saphenous vein graft to first diagonal,  Saphenous vein graft to obtuse marginal 2,  Saphenous vein graft to posterior descending, and Endoscopic vein harvest of right leg.  SURGEON:  Salvatore Decent. Dorris Fetch, MD.  ASSISTANTLowella Dandy, PA.  ANESTHESIA:  General.  FINDINGS:  Saphenous vein from right leg good quality.  Left leg explored, but no vein harvest performed.  Mammary good quality. Poor quality, diffusely diseased target vessels. NOT a candidate for redo bypass grafting.  TEE showed preserved left ventricular function with 1+ mitral regurgitation pre and post bypass.  CLINICAL NOTE:  Jill [name] is a 56 year old woman who was admitted back in August with hyperkalemia.  She mentioned that she had been having some burning in her chest with exertion and an EKG showed T-wave inversions.  An echocardiogram showed an ejection fraction of 45-50% with moderate to severe mitral regurgitation.  On catheterization, she was found to have three-vessel coronary artery disease.  She was treated medically and now returns for surgical revascularization and possible mitral valve repair.  The indications, risks, benefits, and alternatives were discussed in detail with the patient.  She understood and accepted the  risks and agreed to proceed.  OPERATIVE NOTE:  Jill [name] was brought to the preoperative holding area on July 22, 2017.  Anesthesia placed a Swan-Ganz catheter and arterial blood pressure monitoring line.  Her pulmonary artery pressures were normal and no V-wave was noted.  She was taken to the operating room, anesthetized, and intubated.  Intravenous antibiotics were administered.  A Foley catheter was placed.  The chest, abdomen, and legs were prepped and draped in the usual sterile fashion. Transesophageal echocardiography was performed by Dr. Arta Bruce of the Anesthesia Service.  It revealed overall preserved left ventricular systolic function.  There was only trace mitral regurgitation.  Decision was made to proceed with coronary artery bypass grafting without intervention on the mitral valve.  A median sternotomy was performed and the left internal mammary artery was harvested using standard technique.  Simultaneously, incision was made in the medial aspect of the left leg.  No suitable saphenous vein was found, so an incision was made on the medial aspect of the right leg.  The saphenous vein was identified.  It was harvested endoscopically.  The saphenous vein turned out to be good quality.  2000 units of heparin was administered during the vessel harvest.  The remainder of full heparin dose was given prior to opening the pericardium.  There was excellent flow through the left mammary when the distal  end was divided.  After harvesting the conduits, the pericardium was opened.  Adequate anticoagulation was confirmed with ACT measurement.  The aorta was cannulated via concentric 2-0 Ethibond pledgeted pursestring sutures.  A dual-stage venous cannula was placed via pursestring suture in the right atrial appendage.  Cardiopulmonary bypass was initiated.  Flows were maintained per protocol.  The patient was cooled to 32 degrees Celsius. The coronary arteries were inspected  and anastomotic sites were chosen. On the lateral wall, there were 3 branches with the middle OM-2 branch being the largest.  OM-1 and OM-3 were both far too small to graft.  All of her coronaries were diffusely calcified.  The conduits were inspected and cut to length.  A foam pad was placed in the pericardium.  A temperature probe was placed in the myocardial septum, and a cardioplegia cannula was placed in the ascending aorta.  The aorta was crossclamped.  The left ventricle was emptied via the aortic root vent.  Cardiac arrest then was achieved a combination of cold antegrade blood cardioplegia and topical iced saline.  750 mL of cardioplegia was administered.  There was a rapid diastolic arrest and septal cooling to 9 degrees Celsius.  A reversed saphenous vein graft was placed end-to-side to the posterior descending branch of the right coronary artery.  The vein was good quality.  The target was diffusely diseased, small vessel. It was 1.5 mm at the site of the anastomosis, but only a 1-mm probe would pass distally.  The vein was anastomosed end-to-side with a running 7-0 Prolene suture.  A probe was passed proximally and distally at the completion of anastomosis.  Cardioplegia was administered with good flow and good hemostasis.  A reversed saphenous vein graft was placed end-to-side to obtuse marginal 2.  This was also a diffusely diseased poor quality target vessel.  It did accept a 1.5-mm probe proximally and distally.  The vein was good quality.  It was anastomosed end-to-side with a running 7-0 Prolene suture.  A probe passed proximally and distally.  Cardioplegia was administered with good flow and good hemostasis.  Additional cardioplegia was administered down the aortic root.  A reversed saphenous vein graft was placed end-to-side to the first diagonal branch of the LAD.  This was a high anterolateral branch.  It was a 1.5-mm vessel, diffusely diseased, but better  quality than the posterior descending and OM.  The vein was anastomosed end-to-side with a running 7-0 Prolene suture.  A 1.5-mm probe passed easily proximally and distally.  Cardioplegia was administered with good flow and good hemostasis.  The left internal mammary artery was brought through a window in the pericardium.  The distal end was beveled and it was anastomosed end-to- side to the LAD.  The LAD was diffusely diseased vessel with significant calcified plaque throughout.  It was overall poor quality.  The mammary was good quality.  The end-to-side anastomosis was performed with a running 8-0 Prolene suture.  At the completion of anastomosis, the bulldog clamp was briefly removed.  Septal rewarming was noted.  The bulldog clamp was replaced.  The mammary pedicle was tacked to the epicardial surface of the heart with 6-0 Prolene sutures.  Additional cardioplegia was administered.  Then, the cardioplegia cannula was removed from the ascending aorta.  The vein grafts were cut to length.  The proximal anastomoses for the diagonal and right coronary vein grafts were performed to 4.5 mm punch aortotomies with running 6-0 Prolene sutures.  At the completion of the  2nd proximal anastomosis, the patient was placed in Trendelenburg position.  Lidocaine was administered.  The aortic root was de-aired, and the aortic crossclamp was removed.  The total crossclamp time was 71 minute.  The patient spontaneously resumed sinus rhythm and did not require defibrillation.  Bulldog clamps were placed proximally and distally on the saphenous vein to the diagonal.  Longitudinal venotomy was made. The proximal anastomosis for the OM graft was performed with a running 7- 0 Prolene suture.  The anastomosis was de-aired before reestablishing flow.  The reason for this was that the aorta was relatively short and there was not adequate space to land the proximal anastomoses for both the diagonal and OM  directly to the aorta.  While rewarming was completed, all proximal and distal anastomoses were inspected for hemostasis.  Epicardial pacing wires were placed on the right ventricle and right atrium.  When the patient had rewarmed to a core temperature of 37 degrees Celsius, she was weaned from cardiopulmonary bypass on the first attempt.  Total bypass time was 116 minutes.  The initial cardiac index was approximately 2 L/min/m2.  The patient remained hemodynamically stable throughout the initial postbypass.  Postbypass transesophageal echocardiography revealed 1+ mild mitral regurgitation and overall preserved left ventricular function.  A test dose of protamine was administered.  There was a slight dip in blood pressure, which responded to volume administration.  The atrial and aortic cannulae were removed.  The remainder of the protamine was administered without incident.  The chest was irrigated with warm saline.  Hemostasis was achieved.  The pericardium was reapproximated over the ascending aorta and base of the heart with interrupted 3-0 silk sutures.  Left pleural and mediastinal chest tubes were placed through separate subcostal incisions.  The sternum was closed with a combination of single and double heavy gauge stainless steel wires.  The patient did have a decrease in cardiac output with no other changes in the rhythm or blood pressure with chest closure.  Low-dose dopamine infusion was initiated, but stopped due to tachycardia and hypertension, and the patient's cardiac index recovered without further incident.  The pectoralis fascia, subcutaneous tissue, and skin were closed in a standard fashion.  All sponge, needle, and instrument counts were correct at the end of the procedure.  The patient was taken from the operating room to the Surgical Intensive Care Unit intubated and in good condition.     Salvatore Decent Dorris Fetch, M.D.     SCH/MEDQ  D:  07/22/2017  T:   07/23/2017  Job:  [id number]

## 2017-07-24 ENCOUNTER — Inpatient Hospital Stay (HOSPITAL_COMMUNITY): Payer: Medicaid Other

## 2017-07-24 LAB — CBC
HCT: 22.7 % — ABNORMAL LOW (ref 36.0–46.0)
Hemoglobin: 6.9 g/dL — CL (ref 12.0–15.0)
MCH: 25.7 pg — AB (ref 26.0–34.0)
MCHC: 30.4 g/dL (ref 30.0–36.0)
MCV: 84.4 fL (ref 78.0–100.0)
PLATELETS: 145 10*3/uL — AB (ref 150–400)
RBC: 2.69 MIL/uL — AB (ref 3.87–5.11)
RDW: 14.8 % (ref 11.5–15.5)
WBC: 10.7 10*3/uL — ABNORMAL HIGH (ref 4.0–10.5)

## 2017-07-24 LAB — GLUCOSE, CAPILLARY
GLUCOSE-CAPILLARY: 95 mg/dL (ref 65–99)
GLUCOSE-CAPILLARY: 99 mg/dL (ref 65–99)
GLUCOSE-CAPILLARY: 102 mg/dL — AB (ref 65–99)
Glucose-Capillary: 104 mg/dL — ABNORMAL HIGH (ref 65–99)
Glucose-Capillary: 138 mg/dL — ABNORMAL HIGH (ref 65–99)
Glucose-Capillary: 89 mg/dL (ref 65–99)

## 2017-07-24 LAB — BASIC METABOLIC PANEL
Anion gap: 7 (ref 5–15)
BUN: 21 mg/dL — AB (ref 6–20)
CHLORIDE: 107 mmol/L (ref 101–111)
CO2: 25 mmol/L (ref 22–32)
CREATININE: 1.09 mg/dL — AB (ref 0.44–1.00)
Calcium: 8.1 mg/dL — ABNORMAL LOW (ref 8.9–10.3)
GFR calc non Af Amer: 56 mL/min — ABNORMAL LOW (ref >60)
GLUCOSE: 92 mg/dL (ref 65–99)
Potassium: 4.4 mmol/L (ref 3.5–5.1)
Sodium: 139 mmol/L (ref 135–145)

## 2017-07-24 LAB — PREPARE RBC (CROSSMATCH)

## 2017-07-24 MED ORDER — INSULIN DETEMIR 100 UNIT/ML ~~LOC~~ SOLN
25.0000 [IU] | Freq: Two times a day (BID) | SUBCUTANEOUS | Status: DC
  Administered 2017-07-24 – 2017-07-25 (×4): 25 [IU] via SUBCUTANEOUS
  Filled 2017-07-24 (×6): qty 0.25

## 2017-07-24 MED ORDER — MOVING RIGHT ALONG BOOK- IN SPANISH
Freq: Once | Status: AC
  Administered 2017-07-24: 21:00:00
  Filled 2017-07-24: qty 1

## 2017-07-24 MED ORDER — INSULIN ASPART 100 UNIT/ML ~~LOC~~ SOLN
5.0000 [IU] | Freq: Three times a day (TID) | SUBCUTANEOUS | Status: DC
  Administered 2017-07-24 – 2017-07-27 (×4): 5 [IU] via SUBCUTANEOUS

## 2017-07-24 MED ORDER — SODIUM CHLORIDE 0.9 % IV SOLN
Freq: Once | INTRAVENOUS | Status: AC
  Administered 2017-07-24: 09:00:00 via INTRAVENOUS

## 2017-07-24 MED ORDER — METFORMIN HCL 500 MG PO TABS
1000.0000 mg | ORAL_TABLET | Freq: Every day | ORAL | Status: DC
  Administered 2017-07-24 – 2017-07-25 (×2): 1000 mg via ORAL
  Filled 2017-07-24: qty 2

## 2017-07-24 MED ORDER — FUROSEMIDE 40 MG PO TABS
40.0000 mg | ORAL_TABLET | Freq: Every day | ORAL | Status: DC
  Administered 2017-07-25: 40 mg via ORAL
  Filled 2017-07-24: qty 2

## 2017-07-24 MED ORDER — MAGNESIUM HYDROXIDE 400 MG/5ML PO SUSP: 30.0000 mL | Freq: Every day | ORAL | Status: DC | PRN

## 2017-07-24 MED ORDER — MOVING RIGHT ALONG BOOK
Freq: Once | Status: DC
  Filled 2017-07-24: qty 1

## 2017-07-24 MED ORDER — ZOLPIDEM TARTRATE 5 MG PO TABS: 5.0000 mg | ORAL_TABLET | Freq: Every evening | ORAL | Status: DC | PRN

## 2017-07-24 MED ORDER — LOSARTAN POTASSIUM 25 MG PO TABS: 25.0000 mg | ORAL_TABLET | Freq: Every day | ORAL | Status: DC

## 2017-07-24 MED ORDER — PNEUMOCOCCAL VAC POLYVALENT 25 MCG/0.5ML IJ INJ
0.5000 mL | INJECTION | INTRAMUSCULAR | Status: DC
  Filled 2017-07-24: qty 0.5

## 2017-07-24 MED ORDER — CIPROFLOXACIN HCL 500 MG PO TABS
500.0000 mg | ORAL_TABLET | Freq: Two times a day (BID) | ORAL | Status: AC
  Administered 2017-07-24 – 2017-07-26 (×6): 500 mg via ORAL
  Filled 2017-07-24 (×5): qty 1

## 2017-07-24 MED ORDER — INFLUENZA VAC SPLIT QUAD 0.5 ML IM SUSY
0.5000 mL | PREFILLED_SYRINGE | INTRAMUSCULAR | Status: DC
  Filled 2017-07-24: qty 0.5

## 2017-07-24 MED ORDER — INSULIN ASPART 100 UNIT/ML ~~LOC~~ SOLN
0.0000 [IU] | Freq: Three times a day (TID) | SUBCUTANEOUS | Status: DC
  Administered 2017-07-25 – 2017-07-26 (×2): 4 [IU] via SUBCUTANEOUS
  Administered 2017-07-27 (×2): 3 [IU] via SUBCUTANEOUS

## 2017-07-24 MED ORDER — SODIUM CHLORIDE 0.9% FLUSH: 3.0000 mL | INTRAVENOUS | Status: DC | PRN

## 2017-07-24 MED ORDER — SODIUM CHLORIDE 0.9 % IV SOLN: 250.0000 mL | INTRAVENOUS | Status: DC | PRN

## 2017-07-24 MED ORDER — LINAGLIPTIN 5 MG PO TABS
5.0000 mg | ORAL_TABLET | Freq: Every day | ORAL | Status: DC
  Administered 2017-07-24 – 2017-07-25 (×2): 5 mg via ORAL
  Filled 2017-07-24 (×2): qty 1

## 2017-07-24 MED ORDER — SODIUM CHLORIDE 0.9% FLUSH
3.0000 mL | Freq: Two times a day (BID) | INTRAVENOUS | Status: DC
  Administered 2017-07-24 – 2017-07-26 (×3): 3 mL via INTRAVENOUS

## 2017-07-24 MED ORDER — FUROSEMIDE 10 MG/ML IJ SOLN
40.0000 mg | Freq: Once | INTRAMUSCULAR | Status: AC
  Administered 2017-07-24: 40 mg via INTRAVENOUS
  Filled 2017-07-24: qty 4

## 2017-07-24 MED ORDER — SITAGLIPTIN PHOS-METFORMIN HCL 50-1000 MG PO TABS: 1.0000 | ORAL_TABLET | Freq: Every day | ORAL | Status: DC

## 2017-07-24 NOTE — Progress Notes (Addendum)
2 Days Post-Op Procedure(s) (LRB): CORONARY ARTERY BYPASS GRAFTING (CABG) x four, using left internal mammary artery and right leg greater saphenous vein harvested endoscopically (N/A) TRANSESOPHAGEAL ECHOCARDIOGRAM (TEE) (N/A) Subjective: No complaints currently but felt dizzy when trying to walk earlier Denies pain Objective: Vital signs in last 24 hours: Temp:  [97.9 F (36.6 C)-99.1 F (37.3 C)] 98.4 F (36.9 C) (10/05 0400) Pulse Rate:  [69-89] 77 (10/05 0700) Cardiac Rhythm: Normal sinus rhythm (10/05 0500) Resp:  [7-32] 11 (10/05 0700) BP: (64-131)/(48-86) 92/51 (10/05 0700) SpO2:  [97 %-100 %] 99 % (10/05 0700) Arterial Line BP: (80-118)/(22-53) 96/49 (10/04 1200) Weight:  [218 lb 11.1 oz (99.2 kg)] 218 lb 11.1 oz (99.2 kg) (10/05 0600)  Hemodynamic parameters for last 24 hours: PAP: (30)/(13) 30/13  Intake/Output from previous day: 10/04 0701 - 10/05 0700 In: 1959.5 [P.O.:1020; I.V.:839.5; IV Piggyback:100] Out: 1023 [Urine:998; Drains:15; Chest Tube:10] Intake/Output this shift: No intake/output data recorded.  General appearance: alert, cooperative and no distress Neurologic: intact Heart: regular rate and rhythm Lungs: diminished breath sounds bibasilar Abdomen: normal findings: soft, non-tender  Lab Results:  Recent Labs  07/23/17 1547 07/23/17 1555 07/24/17 0351  WBC 10.8*  --  10.7*  HGB 7.3* 7.5* 6.9*  HCT 24.2* 22.0* 22.7*  PLT 150  --  145*   BMET:  Recent Labs  07/23/17 0243  07/23/17 1555 07/24/17 0351  NA 140  --  140 139  K 4.6  --  4.8 4.4  CL 109  --  104 107  CO2 25  --   --  25  GLUCOSE 105*  --  195* 92  BUN 23*  --  23* 21*  CREATININE 1.06*  < > 1.10* 1.09*  CALCIUM 8.2*  --   --  8.1*  < > = values in this interval not displayed.  PT/INR:  Recent Labs  07/22/17 1514  LABPROT 17.5*  INR 1.45   ABG    Component Value Date/Time   PHART 7.311 (L) 07/23/2017 0501   HCO3 23.7 07/23/2017 0501   TCO2 23 07/23/2017 1555    ACIDBASEDEF 2.0 07/23/2017 0501   O2SAT 96.0 07/23/2017 0501   CBG (last 3)   Recent Labs  07/23/17 2029 07/24/17 0004 07/24/17 0401  GLUCAP 182* 138* 89    Assessment/Plan: S/P Procedure(s) (LRB): CORONARY ARTERY BYPASS GRAFTING (CABG) x four, using left internal mammary artery and right leg greater saphenous vein harvested endoscopically (N/A) TRANSESOPHAGEAL ECHOCARDIOGRAM (TEE) (N/A) -POD # 2  CV- in SR, still on neosynephrine drip- wean as BP allows  ASA, crestor, beta blocker  Will start cozaar tomorrow RESP- Bibasilar atelectasis- IS RENAL- creatinine stable,,lytes oK  Dc Foley  Resume cipro for 3 more days for preop UTI ENDO- CBG highly variable- resume home regimen  Change to AC/ HS Anemia- Hgb down slightly and symptomatic this AM with walk  Will transfuse 1 unit Deconditioning- continue ambulation Transfer to 4E later today if off neo   LOS: 2 days    Loreli Slot 07/24/2017

## 2017-07-24 NOTE — Progress Notes (Signed)
Attempted to ambulate pt with second RN. Second RN followed pt and this RN with pt chair. Pt gait became more and more unsteady, this RN placed pt in chair in hallway and wheeled back to room. Pt vitals stable. Will continue to monitor.  Herma Ard, RN

## 2017-07-24 NOTE — Discharge Summary (Signed)
Physician Discharge Summary       [address] [place]       [place] [address]             [phone]    Patient ID: Jill [name] MRN: [id number] DOB/AGE: 1961-07-27 56 y.o.  Admit date: 07/22/2017 Discharge date: 07/28/2017  Admission Diagnoses: Coronary artery disease  Active Diagnoses:  1. Hyperlipidemia 2. Hypertension 3. Type 2 diabetes mellitus (HCC) 4. Diabetic neuropathy (HCC) 5. ABL anemia 6. Thrombocytopenia  Procedure (s):  Median sternotomy, extracorporeal circulation, coronary artery bypass grafting x4 (left internal mammary artery to left anterior descending, saphenous vein graft to first diagonal, saphenous vein graft to obtuse marginal 2, saphenous vein graft to posterior descending), and endoscopic vein harvest of right leg by Dr. Dorris Fetch on 07/22/2017.  History of Presenting Illness:* Jill [name] is a 56 year old woman with a past medical history significant for type 2 diabetes complicated by neuropathy and diabetic foot ulcer, hypertension, and hyperlipidemia. [facility] was [facility] late August for her diabetic foot ulcerations. A basic metabolic panel was done and her potassium was greater than 7.5. She went to the ER the following morning and a repeat potassium was again greater than 7.5 and she was admitted to the hospital. She mentioned to the doctor that she had been having a burning sensation in her chest with exertion. An EKG showed T-wave inversions in V4 through 6, III, and aVF. An echocardiogram showed an ejection fraction of 45-50% with moderate to severe mitral regurgitation. She was transferred to [facility]. A TEE showed moderate to severe MR. The catheterization she was found to have three-vessel coronary disease.  She was seen and evaluated by the dental service and cleared for surgery.  Since discharge she continues to feel fatigued. She continues to get short of breath and a vague burning  sensation in her chest with exertion. She had an episode while walking from the parking lot to the elevator in her office. She denies any orthopnea or peripheral edema. She has not had rest or nocturnal symptoms.  Jill [name] is a 56 year old Hispanic woman with past medical history significant for type 2 diabetes complicated by diabetic neuropathy, hypertension, hyperlipidemia, and obesity. She has three-vessel coronary disease and moderately severe mitral regurgitation. She has severe disease in a diagonal branch the LAD and the circumflex. There is moderate disease in the LAD and right coronary. Treatment options include medical therapy versus surgery for coronary bypass grafting and mitral valve repair or replacement. She currently is on a good medical regimen but continues to have significant symptoms which are limiting to her.Per Dr. Dorris Fetch, she will do much better in both the short and long-term with revascularization and correction of her mitral regurgitation. Potential risks, benefits, and complications of coronary artery bypass grafting surgery were discussed with the patient and she agreed to proceed with surgery. Pre operative carotid duplex US showed no significant internal carotid artery stenosis bilaterally. She underwent a CABG x 4 on 07/22/2017.  Brief Hospital Course:  The patient was extubated the evening of surgery without difficulty. She remained afebrile and hemodynamically stable. She was weaned off of Neo Synephrine drip. Theone Murdoch, a line, chest tubes, and foley were removed early in the post operative course. Lopressor was started and titrated accordingly. She was volume over loaded and diuresed. She had ABL anemia. She did require a post op transfusion. Last H and H was 8.5. Her creatinine did increase on 10/07 to 2.06.  Mediations were adjusted accordingly. Her creatinine did improve with time and was down to 1.08. She was weaned off the insulin drip.  Once she was tolerating a  diet, home diabetic medicines were restarted.  The patient's glucose remained well controlled.The patient's HGA1C pre op was 9.1. She will require close follow up with her medical doctor after discharge.  She became hypotensive and required addition of Midodrine to help with BP support. The patient was felt surgically stable for transfer from the ICU to [facility]U for further convalescence on 07/24/2017. She continues to progress with cardiac rehab. She was ambulating on room air. She has been tolerating a diet and has had a bowel movement. Epicardial pacing wires were removed on 07/25/2017. Chest tube sutures will be removed the day of discharge. The patient is felt surgically stable for discharge today.   Latest Vital Signs: Blood pressure 136/65, pulse 67, temperature 98 F (36.7 C), temperature source Oral, resp. rate 17, height  (1.575 m), weight 224 lb 8 oz (101.8 kg), SpO2 95 %.  Physical Exam: Cardiovascular: RRR Pulmonary:Dimished at bases Abdomen: Soft, obese, non tender, bowel sounds present. Extremities: Mild bilateral lower extremity edema. Wounds: Clean and dry.  No erythema or signs of infection.  Discharge Condition:Stable and discharged to home.  Recent laboratory studies:  Lab Results  Component Value Date   WBC 10.9 (H) 07/27/2017   HGB 8.5 (L) 07/27/2017   HCT 28.0 (L) 07/27/2017   MCV 85.9 07/27/2017   PLT 243 07/27/2017   Lab Results  Component Value Date   NA 139 07/28/2017   K 4.7 07/28/2017   CL 105 07/28/2017   CO2 27 07/28/2017   CREATININE 1.08 (H) 07/28/2017   GLUCOSE 69 07/28/2017     Diagnostic Studies: Dg Orthopantogram  Result Date: 06/29/2017 CLINICAL DATA:  Dental evaluation prior to cardiac surgery. EXAM: ORTHOPANTOGRAM/PANORAMIC COMPARISON:  None. FINDINGS: Remaining dentition does not show any evidence of caries. No evidence of periodontal disease except for subtle lucency at the root apices of tooth 19 and tooth 28. IMPRESSION: No evidence of  caries. Mild lucency at the root apices at tooth 19 and tooth 28, which could represent early radicular cyst formation. Electronically Signed   By: Paulina Fusi M.D.   On: 06/29/2017 09:51    Ct Head Wo Contrast  Result Date: 06/29/2017 CLINICAL DATA:  Altered level of consciousness. Fell and hit head. History of hypertension, hyperlipidemia and diabetes. EXAM: CT HEAD WITHOUT CONTRAST TECHNIQUE: Contiguous axial images were obtained from the base of the skull through the vertex without intravenous contrast. COMPARISON:  None. FINDINGS: BRAIN: No intraparenchymal hemorrhage, mass effect nor midline shift. The ventricles and sulci are normal. No acute large vascular territory infarcts. No abnormal extra-axial fluid collections. RIGHT parafalcine punctate or possibly vascular calcification. Basal cisterns are patent. VASCULAR: Mild calcific atherosclerosis carotid siphon. SKULL/SOFT TISSUES: No skull fracture. Moderate RIGHT frontal scalp hematoma without subcutaneous gas or radiopaque foreign bodies. ORBITS/SINUSES: The included ocular globes and orbital contents are normal.Mild paranasal sinus mucosal thickening without air-fluid levels. Mastoid air cells are well aerated. OTHER: None. IMPRESSION: 1. No acute intracranial process. RIGHT frontal scalp hematoma without skull fracture. 2. Otherwise negative noncontrast CT HEAD for age. Electronically Signed   By: Awilda Metro M.D.   On: 06/29/2017 03:36   Ct Angio Chest Pe W Or Wo Contrast  Result Date: 06/29/2017 CLINICAL DATA:  Chest pain, hypoxemia EXAM: CT ANGIOGRAPHY CHEST WITH CONTRAST TECHNIQUE: Multidetector CT imaging of the chest was  performed using the standard protocol during bolus administration of intravenous contrast. Multiplanar CT image reconstructions and MIPs were obtained to evaluate the vascular anatomy. CONTRAST:  100 cc Isovue 370 IV COMPARISON:  Chest CT 03/05/2016 FINDINGS: Cardiovascular: Heart is normal size. Aorta is normal  caliber. No filling defects in the pulmonary arteries to suggest pulmonary emboli. Mediastinum/Nodes: Prevascular adenopathy, 15 mm in short axis diameter. AP window lymph node has a short axis diameter of 13 mm. Mildly prominent bilateral hilar lymph nodes. No axillary adenopathy. Lungs/Pleura: Moderate bilateral pleural effusions. Compressive atelectasis in the lower lobes. Ground-glass opacities throughout both lungs could reflect edema. Less likely infection. Upper Abdomen: Imaging into the upper abdomen shows no acute findings. Musculoskeletal: Chest wall soft tissues are unremarkable. No acute bony abnormality. Review of the MIP images confirms the above findings. IMPRESSION: 1. No evidence of pulmonary embolus. 2. Moderate bilateral pleural effusions with compressive atelectasis in the lower lobes. 3. Bilateral ground-glass airspace opacities most likely edema, less likely infection. 4. Mild mediastinal adenopathy, likely reactive. Electronically Signed   By: Charlett Nose M.D.   On: 06/29/2017 15:40   Dg Chest Port 1 View  Result Date: 07/24/2017 CLINICAL DATA:  56 year old female status post CABG postop day two. EXAM: PORTABLE CHEST 1 VIEW COMPARISON:  07/23/2017 and earlier. FINDINGS: Portable AP upright view at 0639 hours. Right IJ approach Swan-Ganz catheter has been removed, introducer sheath remains. Left chest tube and mediastinal drain have been removed. Epicardial pacer wires remain. Low lung volumes. No pneumothorax. Increased pulmonary vascular congestion. Patchy and confluent left greater than right lung base opacity. Probable small pleural effusions. Stable cardiac size and mediastinal contours. Negative visible bowel gas pattern. IMPRESSION: 1. Lines and tubes removed except for the right IJ introducer sheath and epicardial wires. 2. No pneumothorax. 3. Low lung volumes with increased vascular congestion but no overt edema. Bibasilar atelectasis and probable small pleural effusions.  Electronically Signed   By: Odessa Fleming M.D.   On: 07/24/2017 07:40    Discharge Medications: Allergies as of 07/28/2017   No Known Allergies     Medication List    STOP taking these medications   ciprofloxacin 500 MG tablet Commonly known as:  CIPRO   sitaGLIPtin-metformin 50-1000 MG tablet Commonly known as:  JANUMET     TAKE these medications   acetaminophen 500 MG tablet Commonly known as:  TYLENOL Take 2 tablets (1,000 mg total) by mouth every 6 (six) hours as needed.   aspirin 325 MG EC tablet Take 1 tablet (325 mg total) by mouth daily. What changed:  medication strength  how much to take   furosemide 40 MG tablet Commonly known as:  LASIX Take 1 tablet (40 mg total) by mouth daily.   gabapentin 400 MG capsule Commonly known as:  NEURONTIN Take 800 mg by mouth 2 (two) times daily.   insulin detemir 100 UNIT/ML injection Commonly known as:  LEVEMIR Inject 25 Units into the skin 2 (two) times daily.   metoprolol succinate 25 MG 24 hr tablet Commonly known as:  TOPROL-XL Take 0.5 tablets (12.5 mg total) by mouth daily. What changed:  when to take this   midodrine 10 MG tablet Commonly known as:  PROAMATINE Take 1 tablet (10 mg total) by mouth 2 (two) times daily with a meal.   rosuvastatin 20 MG tablet Commonly known as:  CRESTOR Take 20 mg by mouth at bedtime.   senna-docusate 8.6-50 MG tablet Commonly known as:  Senokot-S Take 1 tablet by  mouth at bedtime.   traMADol 50 MG tablet Commonly known as:  ULTRAM Take 1 tablet (50 mg total) by mouth every 12 (twelve) hours as needed for moderate pain.   TRULICITY 0.75 MG/0.5ML Sopn Generic drug:  Dulaglutide Inject 0.75 mg into the skin every 7 (seven) days. tuesdays   vitamin C 500 MG tablet Commonly known as:  ASCORBIC ACID Take 500 mg by mouth daily.      The patient has been discharged on:   1.Beta Blocker:  Yes [ x  ]                              No   [   ]                              If  No, reason:  2.Ace Inhibitor/ARB: Yes [  ]                                     No  [  x  ]                                     If No, reason:Elevated creatinine  3.Statin:   Yes [x   ]                  No  [   ]                  If No, reason:  4.Ecasa:  Yes  [ x  ]                  No   [   ]                  If No, reason:  Follow Up Appointments: Follow-up Information    Loreli Slot, MD. Go on 08/25/2017.   Specialty:  Cardiothoracic Surgery Why:  PA/LAT CXR to be taken (at [facility] which is in the same building as Dr. Sunday Corn office, on ground floor) on 08/25/2017 at 9:30 am;Appointment time is at  10:00 am Contact information: [address] [place] [address] [phone]        Marykay Lex, MD Follow up.   Specialty:  Cardiology Contact information: [address] [place] [address] [phone]        [facility] Call.   Why:  Call for a follow up appointment for one week for further treatment of diabetes and surveillance of HGA1C 9.1 Contact information: [address] [place] [address] [phone]           Signed: ZIMMERMAN,DONIELLE MPA-C 07/28/2017, 8:29 AM

## 2017-07-24 NOTE — Progress Notes (Signed)
CRITICAL VALUE ALERT  Critical Value:  hgb 6.9  Date & Time Notied: 07/24/17 4:41 AM  Provider Notified: No, as per order: only notify MD if hgb< 6.0.  Orders Received/Actions taken: no s/sx of bleeding

## 2017-07-25 ENCOUNTER — Inpatient Hospital Stay (HOSPITAL_COMMUNITY): Payer: Medicaid Other

## 2017-07-25 LAB — BASIC METABOLIC PANEL
ANION GAP: 7 (ref 5–15)
BUN: 18 mg/dL (ref 6–20)
CALCIUM: 8.1 mg/dL — AB (ref 8.9–10.3)
CO2: 26 mmol/L (ref 22–32)
Chloride: 106 mmol/L (ref 101–111)
Creatinine, Ser: 0.99 mg/dL (ref 0.44–1.00)
GLUCOSE: 48 mg/dL — AB (ref 65–99)
POTASSIUM: 4.1 mmol/L (ref 3.5–5.1)
Sodium: 139 mmol/L (ref 135–145)

## 2017-07-25 LAB — TYPE AND SCREEN
ABO/RH(D): O NEG
ANTIBODY SCREEN: NEGATIVE
UNIT DIVISION: 0
Unit division: 0

## 2017-07-25 LAB — BPAM RBC
BLOOD PRODUCT EXPIRATION DATE: 201810222359
Blood Product Expiration Date: 201810302359
ISSUE DATE / TIME: 201810030928
ISSUE DATE / TIME: 201810051345
UNIT TYPE AND RH: 9500
UNIT TYPE AND RH: 9500

## 2017-07-25 LAB — GLUCOSE, CAPILLARY
GLUCOSE-CAPILLARY: 103 mg/dL — AB (ref 65–99)
Glucose-Capillary: 136 mg/dL — ABNORMAL HIGH (ref 65–99)
Glucose-Capillary: 183 mg/dL — ABNORMAL HIGH (ref 65–99)

## 2017-07-25 LAB — CBC
HEMATOCRIT: 27 % — AB (ref 36.0–46.0)
Hemoglobin: 8.2 g/dL — ABNORMAL LOW (ref 12.0–15.0)
MCH: 26.1 pg (ref 26.0–34.0)
MCHC: 30.4 g/dL (ref 30.0–36.0)
MCV: 86 fL (ref 78.0–100.0)
PLATELETS: 191 10*3/uL (ref 150–400)
RBC: 3.14 MIL/uL — ABNORMAL LOW (ref 3.87–5.11)
RDW: 14.8 % (ref 11.5–15.5)
WBC: 15.1 10*3/uL — AB (ref 4.0–10.5)

## 2017-07-25 MED ORDER — MIDODRINE HCL 5 MG PO TABS
10.0000 mg | ORAL_TABLET | Freq: Two times a day (BID) | ORAL | Status: DC
  Administered 2017-07-25 – 2017-07-28 (×6): 10 mg via ORAL
  Filled 2017-07-25 (×6): qty 2

## 2017-07-25 MED ORDER — SODIUM CHLORIDE 0.9% FLUSH: 3.0000 mL | INTRAVENOUS | Status: DC | PRN

## 2017-07-25 MED ORDER — SODIUM CHLORIDE 0.9% FLUSH
3.0000 mL | Freq: Two times a day (BID) | INTRAVENOUS | Status: DC
  Administered 2017-07-25 – 2017-07-28 (×5): 3 mL via INTRAVENOUS

## 2017-07-25 MED ORDER — SODIUM CHLORIDE 0.9 % IV SOLN
250.0000 mL | INTRAVENOUS | Status: DC | PRN
  Administered 2017-07-25: 250 mL via INTRAVENOUS

## 2017-07-25 NOTE — Progress Notes (Signed)
Pt received from 2H. Pt and family oriented to room and equipment. Telemetry applied, CCMD notified. Called service response to re-route dinner tray.   Leonidas Romberg, RN

## 2017-07-25 NOTE — Progress Notes (Signed)
3 Days Post-Op Procedure(s) (LRB): CORONARY ARTERY BYPASS GRAFTING (CABG) x four, using left internal mammary artery and right leg greater saphenous vein harvested endoscopically (N/A) TRANSESOPHAGEAL ECHOCARDIOGRAM (TEE) (N/A) Subjective: Doing well  Neo off tx to 4 east  Objective: Vital signs in last 24 hours: Temp:  [98.3 F (36.8 C)-98.9 F (37.2 C)] 98.3 F (36.8 C) (10/06 0700) Pulse Rate:  [68-92] 71 (10/06 0900) Cardiac Rhythm: Normal sinus rhythm (10/06 0800) Resp:  [9-20] 11 (10/06 0900) BP: (86-148)/(44-91) 107/63 (10/06 0900) SpO2:  [96 %-100 %] 100 % (10/06 0900) Weight:  [223 lb 5.2 oz (101.3 kg)] 223 lb 5.2 oz (101.3 kg) (10/06 0600)  Hemodynamic parameters for last 24 hours:  stable  Intake/Output from previous day: 10/05 0701 - 10/06 0700 In: 1571.6 [P.O.:900; I.V.:461.6; Blood:210] Out: 2880 [Urine:2880] Intake/Output this shift: Total I/O In: 22.5 [I.V.:22.5] Out: -   alert  lungs clear extrem warm  Lab Results:  Recent Labs  07/24/17 0351 07/25/17 0357  WBC 10.7* 15.1*  HGB 6.9* 8.2*  HCT 22.7* 27.0*  PLT 145* 191   BMET:  Recent Labs  07/24/17 0351 07/25/17 0357  NA 139 139  K 4.4 4.1  CL 107 106  CO2 25 26  GLUCOSE 92 48*  BUN 21* 18  CREATININE 1.09* 0.99  CALCIUM 8.1* 8.1*    PT/INR:  Recent Labs  07/22/17 1514  LABPROT 17.5*  INR 1.45   ABG    Component Value Date/Time   PHART 7.311 (L) 07/23/2017 0501   HCO3 23.7 07/23/2017 0501   TCO2 23 07/23/2017 1555   ACIDBASEDEF 2.0 07/23/2017 0501   O2SAT 96.0 07/23/2017 0501   CBG (last 3)   Recent Labs  07/24/17 1617 07/24/17 2129 07/25/17 0744  GLUCAP 104* 102* 103*    Assessment/Plan: S/P Procedure(s) (LRB): CORONARY ARTERY BYPASS GRAFTING (CABG) x four, using left internal mammary artery and right leg greater saphenous vein harvested endoscopically (N/A) TRANSESOPHAGEAL ECHOCARDIOGRAM (TEE) (N/A) Mobilize Diuresis Diabetes control Plan for transfer to  step-down: see transfer orders   LOS: 3 days    Kathlee Nations Trigt III 07/25/2017

## 2017-07-26 ENCOUNTER — Inpatient Hospital Stay (HOSPITAL_COMMUNITY): Payer: Medicaid Other

## 2017-07-26 LAB — CBC
HCT: 23.6 % — ABNORMAL LOW (ref 36.0–46.0)
HEMATOCRIT: 22.9 % — AB (ref 36.0–46.0)
Hemoglobin: 7.2 g/dL — ABNORMAL LOW (ref 12.0–15.0)
Hemoglobin: 6.9 g/dL — CL (ref 12.0–15.0)
MCH: 26.3 pg (ref 26.0–34.0)
MCH: 26 pg (ref 26.0–34.0)
MCHC: 30.5 g/dL (ref 30.0–36.0)
MCHC: 30.1 g/dL (ref 30.0–36.0)
MCV: 86.1 fL (ref 78.0–100.0)
MCV: 86.4 fL (ref 78.0–100.0)
PLATELETS: 208 10*3/uL (ref 150–400)
Platelets: 191 10*3/uL (ref 150–400)
RBC: 2.74 MIL/uL — ABNORMAL LOW (ref 3.87–5.11)
RBC: 2.65 MIL/uL — ABNORMAL LOW (ref 3.87–5.11)
RDW: 15 % (ref 11.5–15.5)
RDW: 15 % (ref 11.5–15.5)
WBC: 11.4 10*3/uL — ABNORMAL HIGH (ref 4.0–10.5)
WBC: 10.4 10*3/uL (ref 4.0–10.5)

## 2017-07-26 LAB — BASIC METABOLIC PANEL
Anion gap: 6 (ref 5–15)
Anion gap: 6 (ref 5–15)
BUN: 26 mg/dL — ABNORMAL HIGH (ref 6–20)
BUN: 27 mg/dL — AB (ref 6–20)
CO2: 26 mmol/L (ref 22–32)
CO2: 25 mmol/L (ref 22–32)
CREATININE: 2.06 mg/dL — AB (ref 0.44–1.00)
Calcium: 7.8 mg/dL — ABNORMAL LOW (ref 8.9–10.3)
Calcium: 7.8 mg/dL — ABNORMAL LOW (ref 8.9–10.3)
Chloride: 105 mmol/L (ref 101–111)
Chloride: 107 mmol/L (ref 101–111)
Creatinine, Ser: 2.01 mg/dL — ABNORMAL HIGH (ref 0.44–1.00)
GFR calc Af Amer: 31 mL/min — ABNORMAL LOW (ref >60)
GFR calc Af Amer: 30 mL/min — ABNORMAL LOW (ref >60)
GFR calc non Af Amer: 27 mL/min — ABNORMAL LOW (ref >60)
GFR, EST NON AFRICAN AMERICAN: 26 mL/min — AB (ref >60)
GLUCOSE: 76 mg/dL (ref 65–99)
Glucose, Bld: 90 mg/dL (ref 65–99)
POTASSIUM: 4.6 mmol/L (ref 3.5–5.1)
Potassium: 4.6 mmol/L (ref 3.5–5.1)
SODIUM: 138 mmol/L (ref 135–145)
Sodium: 137 mmol/L (ref 135–145)

## 2017-07-26 LAB — PREPARE RBC (CROSSMATCH)

## 2017-07-26 MED ORDER — INSULIN DETEMIR 100 UNIT/ML ~~LOC~~ SOLN
20.0000 [IU] | Freq: Two times a day (BID) | SUBCUTANEOUS | Status: DC
  Administered 2017-07-26 – 2017-07-27 (×4): 20 [IU] via SUBCUTANEOUS
  Filled 2017-07-26 (×5): qty 0.2

## 2017-07-26 MED ORDER — SODIUM CHLORIDE 0.9 % IV SOLN
Freq: Once | INTRAVENOUS | Status: AC
  Administered 2017-07-26: 13:00:00 via INTRAVENOUS

## 2017-07-26 MED ORDER — TRAMADOL HCL 50 MG PO TABS
50.0000 mg | ORAL_TABLET | Freq: Two times a day (BID) | ORAL | Status: DC | PRN
  Administered 2017-07-27: 50 mg via ORAL
  Filled 2017-07-26: qty 1

## 2017-07-26 MED ORDER — LINAGLIPTIN 5 MG PO TABS
5.0000 mg | ORAL_TABLET | Freq: Every day | ORAL | Status: DC
  Administered 2017-07-26 – 2017-07-28 (×3): 5 mg via ORAL
  Filled 2017-07-26 (×3): qty 1

## 2017-07-26 MED ORDER — GABAPENTIN 400 MG PO CAPS
400.0000 mg | ORAL_CAPSULE | Freq: Two times a day (BID) | ORAL | Status: DC
  Administered 2017-07-26 – 2017-07-28 (×4): 400 mg via ORAL
  Filled 2017-07-26 (×4): qty 1

## 2017-07-26 NOTE — Progress Notes (Signed)
Notified by lab of critical Hb of 6.8 at 0933. Doree Fudge PA paged at [phone]. Patient resting in bed. Will continue to monitor.   Leonidas Romberg, RN

## 2017-07-26 NOTE — Progress Notes (Signed)
EPW removed per protocol at 1010. Sites clean, dry, intact. No telemetry changes. VSS. Pt completed one hour of bedrest.   Leonidas Romberg, RN

## 2017-07-26 NOTE — Progress Notes (Addendum)
Repeat CBC confirms anemia and BMET confirms creatinine 2.06. Will stop Metformin and lasix;decrease Neurontin, decrease Ultram and change frequency. Today will be last day for Cipro. As discussed with Dr. Donata Clay, give 1 U PRBC.

## 2017-07-26 NOTE — Progress Notes (Addendum)
      [address]       [place] [address]             [phone]        4 Days Post-Op Procedure(s) (LRB): CORONARY ARTERY BYPASS GRAFTING (CABG) x four, using left internal mammary artery and right leg greater saphenous vein harvested endoscopically (N/A) TRANSESOPHAGEAL ECHOCARDIOGRAM (TEE) (N/A)  Subjective: Patient has been dizzy post op. She states her sugar was low (70) this am and she felt really bad.  Objective: Vital signs in last 24 hours: Temp:  [97.6 F (36.4 C)-98.8 F (37.1 C)] 97.6 F (36.4 C) (10/07 0422) Pulse Rate:  [65-83] 70 (10/07 0600) Cardiac Rhythm: Normal sinus rhythm (10/07 0701) Resp:  [0-22] 13 (10/07 0600) BP: (80-133)/(40-72) 107/50 (10/07 0600) SpO2:  [91 %-100 %] 91 % (10/07 0600) FiO2 (%):  [24 %] 24 % (10/06 1058) Weight:  [224 lb 3.2 oz (101.7 kg)] 224 lb 3.2 oz (101.7 kg) (10/07 0422)  Pre op weight 95.5 kg Current Weight  07/26/17 224 lb 3.2 oz (101.7 kg)      Intake/Output from previous day: 10/06 0701 - 10/07 0700 In: 892.5 [P.O.:780; I.V.:112.5] Out: -    Physical Exam:  Cardiovascular: RRR Pulmonary:Dimished at bases Abdomen: Soft, obese, non tender, bowel sounds present. Extremities: Mild bilateral lower extremity edema. Wounds: Clean and dry.  No erythema or signs of infection.  Lab Results: CBC: Recent Labs  07/25/17 0357 07/26/17 0324  WBC 15.1* 11.4*  HGB 8.2* 7.2*  HCT 27.0* 23.6*  PLT 191 191   BMET:  Recent Labs  07/25/17 0357 07/26/17 0324  NA 139 137  K 4.1 4.6  CL 106 105  CO2 26 26  GLUCOSE 48* 90  BUN 18 26*  CREATININE 0.99 2.01*  CALCIUM 8.1* 7.8*    PT/INR:  Lab Results  Component Value Date   INR 1.45 07/22/2017   INR 1.07 07/20/2017   INR 1.00 06/26/2017   ABG:  INR: Will add last result for INR, ABG once components are confirmed Will add last 4 CBG results once components are confirmed  Assessment/Plan:  1. CV - SR in the 60's. On Midodrine 10 mg  tid, Lopressor 12.5 mg bid. 2.  Pulmonary - On room air. CXR this am shows no pneumothorax, small bilateral pleural effusions.Encourage incentive spirometer.  3. Volume Overload - On Lasix 40 mg daily 4.  Acute blood loss anemia - H and H decreased from 8.2 and 27 to 7.2 and 23.6. Will repeat cbc 5. Creatinine increased from 0.99 to 2.01 this am. Will repeat bmet 6. DM-CBGs 103/136/183. On Metformin 1000 mg bid, Tradjenta 5 mg daily, and Insulin. Will decrease scheduled Insulin to avoid hypoglycemia. Pre op HGA1C 9.1. She will need close medical follow up after discharge. 7. ID-on Cipro for UTI. Will stop in am 8. Remove EPW  Jill [name] MPA-C 07/26/2017,7:38 AM

## 2017-07-27 ENCOUNTER — Ambulatory Visit (HOSPITAL_COMMUNITY): Payer: Self-pay | Admitting: Physical Therapy

## 2017-07-27 LAB — TYPE AND SCREEN
ABO/RH(D): O NEG
Antibody Screen: NEGATIVE
UNIT DIVISION: 0

## 2017-07-27 LAB — BASIC METABOLIC PANEL
ANION GAP: 7 (ref 5–15)
BUN: 25 mg/dL — ABNORMAL HIGH (ref 6–20)
CO2: 25 mmol/L (ref 22–32)
Calcium: 8.1 mg/dL — ABNORMAL LOW (ref 8.9–10.3)
Chloride: 105 mmol/L (ref 101–111)
Creatinine, Ser: 1.59 mg/dL — ABNORMAL HIGH (ref 0.44–1.00)
GFR, EST AFRICAN AMERICAN: 41 mL/min — AB (ref >60)
GFR, EST NON AFRICAN AMERICAN: 35 mL/min — AB (ref >60)
Glucose, Bld: 99 mg/dL (ref 65–99)
POTASSIUM: 4.7 mmol/L (ref 3.5–5.1)
SODIUM: 137 mmol/L (ref 135–145)

## 2017-07-27 LAB — BPAM RBC
BLOOD PRODUCT EXPIRATION DATE: 201811052359
ISSUE DATE / TIME: 201810071238
Unit Type and Rh: 9500

## 2017-07-27 LAB — GLUCOSE, CAPILLARY
GLUCOSE-CAPILLARY: 146 mg/dL — AB (ref 65–99)
GLUCOSE-CAPILLARY: 166 mg/dL — AB (ref 65–99)
Glucose-Capillary: 63 mg/dL — ABNORMAL LOW (ref 65–99)
Glucose-Capillary: 121 mg/dL — ABNORMAL HIGH (ref 65–99)

## 2017-07-27 LAB — CBC
HCT: 28 % — ABNORMAL LOW (ref 36.0–46.0)
HEMOGLOBIN: 8.5 g/dL — AB (ref 12.0–15.0)
MCH: 26.1 pg (ref 26.0–34.0)
MCHC: 30.4 g/dL (ref 30.0–36.0)
MCV: 85.9 fL (ref 78.0–100.0)
PLATELETS: 243 10*3/uL (ref 150–400)
RBC: 3.26 MIL/uL — ABNORMAL LOW (ref 3.87–5.11)
RDW: 14.6 % (ref 11.5–15.5)
WBC: 10.9 10*3/uL — ABNORMAL HIGH (ref 4.0–10.5)

## 2017-07-27 MED ORDER — ASPIRIN 325 MG PO TBEC: 325.0000 mg | DELAYED_RELEASE_TABLET | Freq: Every day | ORAL | 0 refills | Status: DC

## 2017-07-27 MED ORDER — ACETAMINOPHEN 500 MG PO TABS: 1000.0000 mg | ORAL_TABLET | Freq: Four times a day (QID) | ORAL | 0 refills | Status: AC | PRN

## 2017-07-27 MED ORDER — TRAMADOL HCL 50 MG PO TABS: 50.0000 mg | ORAL_TABLET | Freq: Two times a day (BID) | ORAL | 0 refills | Status: DC | PRN

## 2017-07-27 MED ORDER — MIDODRINE HCL 10 MG PO TABS: 10.0000 mg | ORAL_TABLET | Freq: Two times a day (BID) | ORAL | 1 refills | Status: DC

## 2017-07-27 MED ORDER — METOPROLOL TARTRATE 12.5 MG HALF TABLET
12.5000 mg | ORAL_TABLET | Freq: Two times a day (BID) | ORAL | Status: DC
  Administered 2017-07-27 – 2017-07-28 (×3): 12.5 mg via ORAL
  Filled 2017-07-27 (×3): qty 1

## 2017-07-27 NOTE — Progress Notes (Signed)
CARDIAC REHAB PHASE I   PRE:  Rate/Rhythm: 66 SR  BP:  Supine: 124/63  Sitting:   Standing:    SaO2: 97%RA  MODE:  Ambulation: 470 ft   POST:  Rate/Rhythm: 86 SR  BP:  Supine:   Sitting: 138/67  Standing:    SaO2: 96%RA 0957-1037 Pt walked 470 ft on RA with rolling walker and minimal asst. Gait steady. Tolerated well. No dizziness. Encouraged her to walk with staff or family later. To recliner with call bell. Pt's husband stated that he has a walker at home available for her to use if needed.    Luetta Nutting, RN BSN  07/27/2017 11:01 AM

## 2017-07-27 NOTE — Progress Notes (Addendum)
      [address]       [place] [address]             [phone]      5 Days Post-Op Procedure(s) (LRB): CORONARY ARTERY BYPASS GRAFTING (CABG) x four, using left internal mammary artery and right leg greater saphenous vein harvested endoscopically (N/A) TRANSESOPHAGEAL ECHOCARDIOGRAM (TEE) (N/A)   Subjective:  Jill [name] has no new complaints.  Didn't participate much, kept going back to sleep.  + ambulation     Objective: Vital signs in last 24 hours: Temp:  [97.5 F (36.4 C)-98.7 F (37.1 C)] 97.5 F (36.4 C) (10/08 0420) Pulse Rate:  [64-68] 64 (10/08 0420) Cardiac Rhythm: Normal sinus rhythm (10/08 0700) Resp:  [12-17] 17 (10/08 0420) BP: (92-123)/(47-95) 100/66 (10/08 0420) SpO2:  [92 %-98 %] 98 % (10/08 0420) Weight:  [224 lb 14.4 oz (102 kg)] 224 lb 14.4 oz (102 kg) (10/08 0420)  Intake/Output from previous day: 10/07 0701 - 10/08 0700 In: 604 [P.O.:240; Blood:364] Out: -   General appearance: alert, cooperative and no distress Heart: regular rate and rhythm Lungs: clear to auscultation bilaterally Abdomen: soft, non-tender; bowel sounds normal; no masses,  no organomegaly Extremities: edema trace Wound: clean and dry  Lab Results:  Recent Labs  07/26/17 0825 07/27/17 0252  WBC 10.4 10.9*  HGB 6.9* 8.5*  HCT 22.9* 28.0*  PLT 208 243   BMET:  Recent Labs  07/26/17 0825 07/27/17 0252  NA 138 137  K 4.6 4.7  CL 107 105  CO2 25 25  GLUCOSE 76 99  BUN 27* 25*  CREATININE 2.06* 1.59*  CALCIUM 7.8* 8.1*    PT/INR: No results for input(s): LABPROT, INR in the last 72 hours. ABG    Component Value Date/Time   PHART 7.311 (L) 07/23/2017 0501   HCO3 23.7 07/23/2017 0501   TCO2 23 07/23/2017 1555   ACIDBASEDEF 2.0 07/23/2017 0501   O2SAT 96.0 07/23/2017 0501   CBG (last 3)   Recent Labs  07/25/17 1148 07/25/17 1622 07/27/17 0608  GLUCAP 136* 183* 63*    Assessment/Plan: S/P Procedure(s) (LRB): CORONARY ARTERY  BYPASS GRAFTING (CABG) x four, using left internal mammary artery and right leg greater saphenous vein harvested endoscopically (N/A) TRANSESOPHAGEAL ECHOCARDIOGRAM (TEE) (N/A)  1.  CV- Sinus Brady, BP labile- remains on Lopressor, Midodrine added over the weekend.. May be best to stop BB, will discuss with staff 2. Pulm- no acute issues, not on oxygen 3. Renal- creatinine down to 1.59, K at 4.7, remains volume overloaded, not on Lasix  4. Expected post operative blood loss anemia- Hgb up to 8.5 after transfusion 5. DM- hypoglycemic again this morning, sugars well controlled throughout the day, continue current regimen 6. Dispo- patient stable, possibly d/c BB due to low HR and BB, midodrine was started over the weekend, will discuss with staff, possibly ready for d/c home today vs tomorrow   LOS: 5 days    Raford Pitcher, Jill [name] 07/27/2017 More alert now and in good spirits Her creatinine is better She would benefit from an additional day of cardiac rehab in the hospital Home in AM if creatinine stable or improved  Jill Spare C. Dorris Fetch, MD [facility] [phone]

## 2017-07-28 LAB — GLUCOSE, CAPILLARY
GLUCOSE-CAPILLARY: 56 mg/dL — AB (ref 65–99)
GLUCOSE-CAPILLARY: 91 mg/dL (ref 65–99)

## 2017-07-28 LAB — BASIC METABOLIC PANEL
ANION GAP: 7 (ref 5–15)
BUN: 20 mg/dL (ref 6–20)
CO2: 27 mmol/L (ref 22–32)
Calcium: 8.6 mg/dL — ABNORMAL LOW (ref 8.9–10.3)
Chloride: 105 mmol/L (ref 101–111)
Creatinine, Ser: 1.08 mg/dL — ABNORMAL HIGH (ref 0.44–1.00)
GFR, EST NON AFRICAN AMERICAN: 56 mL/min — AB (ref >60)
Glucose, Bld: 69 mg/dL (ref 65–99)
POTASSIUM: 4.7 mmol/L (ref 3.5–5.1)
SODIUM: 139 mmol/L (ref 135–145)

## 2017-07-28 MED FILL — Magnesium Sulfate Inj 50%: INTRAMUSCULAR | Qty: 10 | Status: AC

## 2017-07-28 MED FILL — Heparin Sodium (Porcine) Inj 1000 Unit/ML: INTRAMUSCULAR | Qty: 30 | Status: AC

## 2017-07-28 MED FILL — Potassium Chloride Inj 2 mEq/ML: INTRAVENOUS | Qty: 40 | Status: AC

## 2017-07-28 NOTE — Care Management Note (Signed)
Case Management Note Donn Pierini RN, BSN Unit 4E-Case Manager-- 2H coverage [phone]  Patient Details  Name: Jill [name] MRN: [id number] Date of Birth: 12/04/1960  Subjective/Objective:  Pt admitted s/p CABGx4 on 07/22/17-                   Action/Plan: PTA pt lived at home- followed at the [facility] for primary care- pt may need MATCH at discharge- CM to follow and review meds prior to discharge for assistance needs.   Expected Discharge Date:  07/28/17               Expected Discharge Plan:  Home/Self Care  In-House Referral:  NA  Discharge planning Services  Medication Assistance, CM Consult, MATCH Program  Post Acute Care Choice:  NA Choice offered to:  NA  DME Arranged:  N/A DME Agency:  NA  HH Arranged:  NA HH Agency:  NA  Status of Service:  Completed, signed off  If discussed at Long Length of Stay Meetings, dates discussed:    Discharge Disposition: home/self care   Additional Comments:  07/28/17- 1025- [name] RN CM- pt for d/c home today with family-  Per cardiac rehab pt has walker available to her if needed-confirmed this with son- CM has reviewed d/c medications- will assist with MATCH for medication assistance on discharge- pt given MATCH letter along with list of pharmacies at bedside- son available to translate- explained program- one time use on discharge with $3 copay cost per script- have included pain meds in with copay coverage- pt will f/u with [facility]- to call for appointment.    Darrold Span, RN 07/28/2017, 10:28 AM

## 2017-07-28 NOTE — Discharge Instructions (Signed)
Injerto de revascularizacin arterial coronaria, cuidados posteriores (Coronary Artery Bypass Grafting, Care After) Siga estas instrucciones durante las prximas semanas. Estas indicaciones le proporcionan informacin general acerca de cmo deber cuidarse despus del procedimiento. El mdico tambin podr darle instrucciones ms especficas. El tratamiento ha sido planificado segn las prcticas mdicas actuales, pero en algunos casos pueden ocurrir problemas. Comunquese con el mdico si tiene algn problema o tiene dudas despus del procedimiento. QU ESPERAR DESPUS DEL PROCEDIMIENTO El proceso de recuperacin de [place] es distinto para cada persona. Algunas personas se sienten bien luego de 3 o 4 semanas, mientras que para otras esto puede llevar ms [employer]. Despus del procedimiento, es normal tener los siguientes sntomas:  Nuseas y falta de apetito.  Estreimiento.  Debilidad y fatiga.  Depresin o irritabilidad.  Dolor o Social worker de la incisin. INSTRUCCIONES PARA EL CUIDADO EN EL HOGAR  Tome los medicamentos solamente como se lo haya indicado el mdico. No deje de tomar los medicamentos ni comience a tomar medicamentos nuevos sin Science writer primero con su mdico.  Tmese el pulso como se lo haya indicado el mdico.  Haga ejercicios de respiracin como se lo haya indicado el mdico. Use un dispositivo llamado espirmetro de incentivo para realizar respiraciones profundas varias veces por da. Presione su pecho con una almohada o sus brazos al respirar profundamente y toser.  Mantenga las reas de la incisin limpias, secas y protegidas. Cambie o retire los Chief Financial Officer se lo haya indicado el mdico. Puede tener cintas [place] sobre el rea de la incisin. No retire las cintas. [place] por s mismas.  Controle diariamente el rea de la incisin para observar la aparicin de enrojecimiento, hinchazn o drenaje.  Si le hicieron incisiones en las piernas,  haga lo siguiente: ? Evite cruzar las piernas. ? Evite estar sentado durante largos perodos. Cambie de posicin cada . ? Eleve las piernas al estar sentado.  Use medias de compresin como le haya indicado su mdico. Estas medias ayudan a evitar la formacin de cogulos en las piernas.  Puede darse una ducha cuando el mdico lo autorice. Hasta ese momento, solo tome baos de [place]. Seque la incisin con golpecitos. No la frote con un pao ni con una toalla. No tome baos de inmersin, no nade ni utilice el jacuzzi hasta que el mdico lo autorice.  Consuma alimentos con fibra, como frutas y verduras crudas, cereales integrales, frijoles y frutos secos. Puede comer carnes si son cortes magros. Evite los alimentos enlatados, procesados y fritos.  Beba suficiente lquido para Photographer orina clara o de color amarillo plido.  Controle su peso a diario. Esto es importante porque lo ayudar a saber si est reteniendo lquidos, lo cual puede hacer trabajar ms a su corazn y sus pulmones.  Descanse y limite las actividades segn las indicaciones del mdico. Es posible que le indiquen que: ? Interrumpa cualquier [place] de inmediato si siente dolor de pecho, falta de aire, latidos irregulares o mareos. Obtenga ayuda de inmediato si observa cualquiera de esos sntomas. ? Muvase con frecuencia, durante breves perodos o haga caminatas cortas segn lo que le indique su mdico. Aumente sus actividades gradualmente. Es posible que requiera fisioterapia o rehabilitacin cardaca para ayudar a fortalecer sus msculos y aumentar su resistencia. ? Evite levantar, empujar o tirar de objetos que pesen ms de 10 libras (4,5 kg) durante al menos 6 semanas luego de la [place].  No conduzca hasta que el mdico lo autorice.  Pregntele al mdico cundo  podr volver al Aleen Campi.  Pregntele al mdico cundo puede reanudar la actividad sexual.  Chauncy Passy a todas las visitas de control como se lo haya  indicado el mdico. Esto es importante. SOLICITE ATENCIN MDICA SI:  Observa hinchazn, enrojecimiento, aumenta el dolor o el Sport and exercise psychologist de la incisin.  Tiene fiebre.  Observa hinchazn en los tobillos o en los pies.  Siente dolor en las piernas.  Aumenta 2libras (0,9kg) o ms por da.  Tiene nuseas o vomita.  Tiene diarrea. SOLICITE ATENCIN MDICA DE INMEDIATO SI:  Tiene dolor de pecho que se extiende hacia su mandbula o brazos.  Le falta el aire.  Tiene latidos cardacos rpidos o irregulares.  Percibe un "chasquido" en el esternn cuando se mueve.  Siente debilidad o adormecimiento en los brazos o en las piernas.  Se siente mareado o aturdido. ASEGRESE DE QUE:  Comprende estas instrucciones.  Controlar su afeccin.  Recibir ayuda de inmediato si no mejora o si empeora. Esta informacin no tiene Theme park manager el consejo del mdico. Asegrese de hacerle al mdico cualquier pregunta que tenga. Document Released: 01/31/2011 Document Revised: 10/27/2014 Elsevier Interactive Patient Education  2017 [employer]. La diabetes mellitus y los alimentos (Diabetes Mellitus and Food) Es importante que controle su nivel de azcar en la sangre (glucosa). El nivel de glucosa en sangre depende en gran medida de lo que usted come. Comer alimentos saludables en las cantidades [place] a lo largo del Futures trader, aproximadamente a la misma hora [employer], lo ayudar a Chief Operating Officer su nivel de Event organiser. Tambin puede ayudarlo a retrasar o Fish farm manager de la diabetes mellitus. Comer de [employer] saludable incluso puede ayudarlo a Event organiser de presin arterial y a Barista o Pharmacologist un peso saludable. Entre las recomendaciones generales para alimentarse y Water quality scientist los alimentos de forma saludable, se incluyen las siguientes:  Respetar las comidas principales y comer colaciones con regularidad. Evitar pasar largos perodos sin comer con el fin de  perder peso.  Seguir una dieta que consista principalmente en alimentos de origen vegetal, como frutas, vegetales, frutos secos, legumbres y cereales integrales.  Utilizar mtodos de coccin a baja temperatura, como hornear, en lugar de mtodos de coccin a alta temperatura, como frer en abundante aceite. Trabaje con el nutricionista para aprender a Acupuncturist nutricional de las etiquetas de los alimentos. CMO PUEDEN AFECTARME LOS ALIMENTOS? Carbohidratos Los carbohidratos afectan el nivel de glucosa en sangre ms que cualquier otro tipo de alimento. El nutricionista lo ayudar a Chief Strategy Officer cuntos carbohidratos puede consumir en cada comida y ensearle a contarlos. El recuento de carbohidratos es importante para mantener la glucosa en sangre en un nivel saludable, en especial si utiliza insulina o toma determinados medicamentos para la diabetes mellitus. Alcohol El alcohol puede provocar disminuciones sbitas de la glucosa en sangre (hipoglucemia), en especial si utiliza insulina o toma determinados medicamentos para la diabetes mellitus. La hipoglucemia es una afeccin que puede poner en peligro la vida. Los sntomas de la hipoglucemia (somnolencia, mareos y Administrator) son similares a los sntomas de haber consumido mucho alcohol. Si el mdico lo autoriza a beber alcohol, hgalo con moderacin y siga estas pautas:  Las mujeres no deben beber ms de un trago por da, y los hombres no deben beber ms de dos tragos por Futures trader. Un trago es igual a: ? 12 onzas (355 ml) de cerveza ? 5 onzas de vino (150 ml) de vino ? 1,5onzas (45ml) de bebidas espirituosas  No  beba con el estmago vaco.  Mantngase hidratado. Beba agua, gaseosas dietticas o t helado sin azcar.  Las gaseosas comunes, los jugos y otros refrescos podran contener muchos carbohidratos y se Heritage manager. QU ALIMENTOS NO SE RECOMIENDAN? Cuando haga las elecciones de alimentos, es importante que recuerde que todos  los alimentos son distintos. Algunos tienen menos nutrientes que otros por porcin, aunque podran tener la misma cantidad de caloras o carbohidratos. Es difcil darle al cuerpo lo que necesita cuando consume alimentos con menos nutrientes. Estos son algunos ejemplos de alimentos que debera evitar ya que contienen muchas caloras y carbohidratos, pero pocos nutrientes:  Neurosurgeon trans (la mayora de los alimentos procesados incluyen grasas trans en la etiqueta de Informacin nutricional).  Gaseosas comunes.  Jugos.  Caramelos.  Dulces, como tortas, pasteles, rosquillas y [place].  Comidas fritas. QU ALIMENTOS PUEDO COMER? Consuma alimentos ricos en nutrientes, que nutrirn el cuerpo y lo mantendrn saludable. Los alimentos que debe comer tambin dependern de varios factores, como:  Las caloras que necesita.  Los medicamentos que toma.  Su peso.  El nivel de glucosa en [place].  El [place] de presin arterial.  El nivel de colesterol. Debe consumir una amplia variedad de alimentos, por ejemplo:  Protenas. ? Cortes de [employer]. ? Protenas con bajo contenido de grasas saturadas, como pescado, clara de huevo y frijoles. Evite las carnes procesadas.  Frutas y vegetales. ? [place] y Sports administrator que pueden ayudar a [employer] niveles sanguneos de [place], como [place], mangos y batatas.  Productos lcteos. ? Elija productos lcteos sin grasa o con bajo contenido de [place], como [place], yogur y [place].  Cereales, panes, pastas y arroz. ? Elija cereales integrales, como panes multicereales, avena en grano y arroz integral. Estos alimentos pueden ayudar a controlar la presin arterial.  Rosalin Hawking. ? Alimentos que contengan grasas saludables, como frutos secos, Chartered certified accountant, aceite de [place], aceite de canola y pescado. TODOS LOS QUE PADECEN DIABETES MELLITUS TIENEN EL MISMO PLAN DE COMIDAS? Dado que todas las personas que padecen diabetes mellitus son distintas, no hay un solo plan  de comidas que funcione para todos. Es muy importante que se rena con un nutricionista que lo ayudar a crear un plan de comidas adecuado para usted. Esta informacin no tiene Theme park manager el consejo del mdico. Asegrese de hacerle al mdico cualquier pregunta que tenga. Document Released: 01/13/2008 Document Revised: 10/27/2014 Document Reviewed: 09/02/2013 Elsevier Interactive Patient Education  2017 [employer].

## 2017-07-28 NOTE — Progress Notes (Signed)
[id number] Son signed form to interpret if needed. Pt understands a lot of English but wanted son to translate if needed. Pt did not want hospital interpreter. Pt voiced understanding of ed . Materials provided in [place] and [place]. Discussed  IS  Use, sternal precautions, ex ed and carb counting. Pt eats a lot of tortillas with meals. Daughter-in-law stated she would help pt with the carb counting as she understood the importance of watching carbs and trying to control diabetes. Discussed CRP 2 and will refer to [place]. Hopefully pt will get Medicaid in future so she can attend CRP 2.  Luetta Nutting RN BSN 07/28/2017 11:25 AM

## 2017-07-28 NOTE — Progress Notes (Addendum)
      [address]       [place] [address]             [phone]        6 Days Post-Op Procedure(s) (LRB): CORONARY ARTERY BYPASS GRAFTING (CABG) x four, using left internal mammary artery and right leg greater saphenous vein harvested endoscopically (N/A) TRANSESOPHAGEAL ECHOCARDIOGRAM (TEE) (N/A)  Subjective: Patient had another episode of hypoglycemia. She feels better now that sugar is higher.  Objective: Vital signs in last 24 hours: Temp:  [97.7 F (36.5 C)-98.3 F (36.8 C)] 98 F (36.7 C) (10/09 0429) Pulse Rate:  [65-71] 67 (10/09 0429) Cardiac Rhythm: Normal sinus rhythm (10/08 1900) Resp:  [12-18] 17 (10/09 0436) BP: (119-136)/(56-65) 136/65 (10/09 0429) SpO2:  [93 %-98 %] 95 % (10/09 0429) Weight:  [224 lb 8 oz (101.8 kg)] 224 lb 8 oz (101.8 kg) (10/09 0436)  Pre op weight 95.5 kg Current Weight  07/28/17 224 lb 8 oz (101.8 kg)      Intake/Output from previous day: 10/08 0701 - 10/09 0700 In: 360 [P.O.:360] Out: -    Physical Exam:  Cardiovascular: RRR Pulmonary: Clear to auscultation bilaterally Abdomen: Soft, obese, non tender, bowel sounds present. Extremities: Mild bilateral lower extremity edema. Wounds: Clean and dry.  No erythema or signs of infection.  Lab Results: CBC:  Recent Labs  07/26/17 0825 07/27/17 0252  WBC 10.4 10.9*  HGB 6.9* 8.5*  HCT 22.9* 28.0*  PLT 208 243   BMET:   Recent Labs  07/27/17 0252 07/28/17 0451  NA 137 139  K 4.7 4.7  CL 105 105  CO2 25 27  GLUCOSE 99 69  BUN 25* 20  CREATININE 1.59* 1.08*  CALCIUM 8.1* 8.6*    PT/INR:  Lab Results  Component Value Date   INR 1.45 07/22/2017   INR 1.07 07/20/2017   INR 1.00 06/26/2017   ABG:  INR: Will add last result for INR, ABG once components are confirmed Will add last 4 CBG results once components are confirmed  Assessment/Plan:  1. CV - SR in the high 60's to low 70's. On Midodrine 10 mg tid, Lopressor 12.5 mg bid. BP  much improved-will discuss with Dr. Dorris Fetch if should continue Midodrine. 2.  Pulmonary - On room air. Encourage incentive spirometer.  3. Volume Overload - Will give Lasix 40 mg daily as weight still up and has LE edema. 4.  Acute blood loss anemia - H and H yesterday 8.5 and 28. 5. Creatinine down to 1.08. Her admission creatinine was 1.44 6. DM-CBGs 166/56/91. Pre op HGA1C 9.1. On Tradjenta 5 mg daily, and Insulin. Metformin stopped Sunday because of elevated creatinine. I will stop Janumet, continue her on Insulin, and she will need close medical follow up after discharge. 7. Will likely discharge   ZIMMERMAN,DONIELLE MPA-C 07/28/2017,7:13 AM Patient seen and examined, agree with above Home today  Viviann Spare C. Dorris Fetch, MD [facility] [phone]

## 2017-07-28 NOTE — Progress Notes (Signed)
[name] to be D/C'd Home per MD order. Discussed with the patient and all questions fully answered.    VVS, Skin clean, dry and intact without evidence of skin break down, no evidence of skin tears noted.  IV catheter discontinued intact. Site without signs and symptoms of complications. Dressing and pressure applied.  An After Visit Summary was printed and given to the patient.  Patient escorted via WC, and D/C home via private auto.  Kai Levins  07/28/2017 12:55 PM

## 2017-07-30 ENCOUNTER — Ambulatory Visit (HOSPITAL_COMMUNITY): Payer: Self-pay | Admitting: Physical Therapy

## 2017-08-04 ENCOUNTER — Ambulatory Visit (HOSPITAL_COMMUNITY)
Admission: RE | Admit: 2017-08-04 | Discharge: 2017-08-04 | Disposition: A | Discharge status: Home / Self Care | Payer: Medicaid Other | Source: Ambulatory Visit | Attending: Cardiology | Admitting: Cardiology

## 2017-08-04 ENCOUNTER — Ambulatory Visit (INDEPENDENT_AMBULATORY_CARE_PROVIDER_SITE_OTHER): Payer: Medicaid Other | Admitting: Cardiology

## 2017-08-04 ENCOUNTER — Encounter: Payer: Self-pay | Admitting: Cardiology

## 2017-08-04 VITALS — BP 120/72 | HR 78 | Ht 62.0 in | Wt 217.6 lb

## 2017-08-04 DIAGNOSIS — M7989 Other specified soft tissue disorders: Secondary | ICD-10-CM

## 2017-08-04 DIAGNOSIS — I959 Hypotension, unspecified: Secondary | ICD-10-CM

## 2017-08-04 DIAGNOSIS — I82401 Acute embolism and thrombosis of unspecified deep veins of right lower extremity: Secondary | ICD-10-CM

## 2017-08-04 DIAGNOSIS — M79604 Pain in right leg: Secondary | ICD-10-CM

## 2017-08-04 DIAGNOSIS — I272 Pulmonary hypertension, unspecified: Secondary | ICD-10-CM

## 2017-08-04 DIAGNOSIS — I2510 Atherosclerotic heart disease of native coronary artery without angina pectoris: Secondary | ICD-10-CM

## 2017-08-04 DIAGNOSIS — Z79899 Other long term (current) drug therapy: Secondary | ICD-10-CM

## 2017-08-04 MED ORDER — MIDODRINE HCL 5 MG PO TABS: 5.0000 mg | ORAL_TABLET | Freq: Two times a day (BID) | ORAL | 3 refills | Status: DC

## 2017-08-04 NOTE — Progress Notes (Signed)
Clinical Summary Jill [name] is a 56 y.o.female seen today for follow up of the following medical problems.   1. CAD/ICM - admitted 06/2017 with chest pain. Trop to 0.3, EKG with inferior and lateral precordial ST/T changes - 05/2017 echo LVEF 45-50%, grade II diastolic dysfunction - cath 06/2017 - s/p CABG10/2/18  LIMA-LAD, SVG-diag, SVG-OM2, SVG-PDA  - no recent chest pain. No SOB/DOE - compliant with meds.   2. Pulmonary HTN - 06/2017 mean PA 46, PCWP 31 - combined pre and postcapillary pulmonary HTN, suspect chronically elevated left sided pressures with secondary pulmonary remodeling.  - no recent SOB or DOE  3. Mitral regurgitation - 06/25/17 TEE mod MR - intraop TEE  07/22/17 mild MR - no recent symptoms  4. Right leg swelling - right leg swelling since recent CABG.   Past Medical History:  Diagnosis Date  . Diabetic neuropathy (HCC)   . Dyspnea   . Dysrhythmia   . Headache   . Hyperlipidemia   . Hypertension   . Type 2 diabetes mellitus (HCC)      No Known Allergies   Current Outpatient Prescriptions  Medication Sig Dispense Refill  . acetaminophen (TYLENOL) 500 MG tablet Take 2 tablets (1,000 mg total) by mouth every 6 (six) hours as needed. 30 tablet 0  . aspirin EC 325 MG EC tablet Take 1 tablet (325 mg total) by mouth daily. 30 tablet 0  . Dulaglutide (TRULICITY) 0.75 MG/0.5ML SOPN Inject 0.75 mg into the skin every 7 (seven) days. tuesdays    . furosemide (LASIX) 40 MG tablet Take 1 tablet (40 mg total) by mouth daily. 30 tablet 0  . gabapentin (NEURONTIN) 400 MG capsule Take 800 mg by mouth 2 (two) times daily.     . insulin detemir (LEVEMIR) 100 UNIT/ML injection Inject 25 Units into the skin 2 (two) times daily.     . metoprolol succinate (TOPROL-XL) 25 MG 24 hr tablet Take 0.5 tablets (12.5 mg total) by mouth daily. (Patient taking differently: Take 12.5 mg by mouth at bedtime. ) 60 tablet 0  . midodrine (PROAMATINE) 10 MG tablet Take 1 tablet (10 mg  total) by mouth 2 (two) times daily with a meal. 60 tablet 1  . rosuvastatin (CRESTOR) 20 MG tablet Take 20 mg by mouth at bedtime.     . senna-docusate (SENOKOT-S) 8.6-50 MG tablet Take 1 tablet by mouth at bedtime. (Patient not taking: Reported on 07/17/2017) 30 tablet 0  . traMADol (ULTRAM) 50 MG tablet Take 1 tablet (50 mg total) by mouth every 12 (twelve) hours as needed for moderate pain. 30 tablet 0  . vitamin C (ASCORBIC ACID) 500 MG tablet Take 500 mg by mouth daily.     No current facility-administered medications for this visit.      Past Surgical History:  Procedure Laterality Date  . CORONARY ARTERY BYPASS GRAFT N/A 07/22/2017   Procedure: CORONARY ARTERY BYPASS GRAFTING (CABG) x four, using left internal mammary artery and right leg greater saphenous vein harvested endoscopically;  Surgeon: Loreli Slot, MD;  Location: [facility] OR;  Service: Open Heart Surgery;  Laterality: N/A;  . RIGHT/LEFT HEART CATH AND CORONARY ANGIOGRAPHY N/A 06/26/2017   Procedure: RIGHT/LEFT HEART CATH AND CORONARY ANGIOGRAPHY;  Surgeon: Marykay Lex, MD;  Location: [facility] INVASIVE CV LAB;  Service: Cardiovascular;  Laterality: N/A;  . TEE WITHOUT CARDIOVERSION N/A 06/25/2017   Procedure: TRANSESOPHAGEAL ECHOCARDIOGRAM (TEE);  Surgeon: Thurmon Fair, MD;  Location: [facility] ENDOSCOPY;  Service: Cardiovascular;  Laterality: N/A;  . TEE WITHOUT CARDIOVERSION N/A 07/22/2017   Procedure: TRANSESOPHAGEAL ECHOCARDIOGRAM (TEE);  Surgeon: Loreli Slot, MD;  Location: [facility] OR;  Service: Open Heart Surgery;  Laterality: N/A;  . TUBAL LIGATION       No Known Allergies    Family History  Problem Relation Age of Onset  . Diabetes type II Other   . Hypertension Other      Social History Jill [name] reports that she has never smoked. She has never used smokeless tobacco. Jill [name] reports that she does not drink alcohol.   Review of Systems CONSTITUTIONAL: No weight loss, fever, chills, weakness or  fatigue.  HEENT: Eyes: No visual loss, blurred vision, double vision or yellow sclerae.No hearing loss, sneezing, congestion, runny nose or sore throat.  SKIN: No rash or itching.  CARDIOVASCULAR: per hpi RESPIRATORY: per hpi GASTROINTESTINAL: No anorexia, nausea, vomiting or diarrhea. No abdominal pain or blood.  GENITOURINARY: No burning on urination, no polyuria NEUROLOGICAL: No headache, dizziness, syncope, paralysis, ataxia, numbness or tingling in the extremities. No change in bowel or bladder control.  MUSCULOSKELETAL: No muscle, back pain, joint pain or stiffness.  LYMPHATICS: No enlarged nodes. No history of splenectomy.  PSYCHIATRIC: No history of depression or anxiety.  ENDOCRINOLOGIC: No reports of sweating, cold or heat intolerance. No polyuria or polydipsia.  Marland Kitchen   Physical Examination Vitals:   08/04/17 0913 08/04/17 0921  BP: 112/60 120/72  Pulse: 78   SpO2: 93%    Vitals:   08/04/17 0913  Weight: 217 lb 9.6 oz (98.7 kg)  Height:  (1.575 m)    Gen: resting comfortably, no acute distress HEENT: no scleral icterus, pupils equal round and reactive, no palptable cervical adenopathy,  CV: RRR, 2/6 systolc murmur at apex, no jvd Resp: Clear to auscultation bilaterally GI: abdomen is soft, non-tender, non-distended, normal bowel sounds, no hepatosplenomegaly MSK: extremities are warm, no edema.  Skin: warm, no rash Neuro:  no focal deficits Psych: appropriate affect   Diagnostic Studies 05/2017 echo  Study Conclusions  - Left ventricle: The cavity size was normal. Wall thickness was at   the upper limits of normal. Systolic function was mildly reduced.   The estimated ejection fraction was in the range of 45% to 50%.   Diffuse hypokinesis. Features are consistent with a pseudonormal   left ventricular filling pattern, with concomitant abnormal   relaxation and increased filling pressure (grade 2 diastolic   dysfunction). - Mitral valve: There was  moderate to severe regurgitation. - Left atrium: The atrium was moderately dilated. - Right atrium: Central venous pressure (est): 3 mm Hg. - Atrial septum: No defect or patent foramen ovale was identified. - Tricuspid valve: There was mild regurgitation. - Pulmonary arteries: Systolic pressure was severely increased. PA   peak pressure: 62 mm Hg (S). - Pericardium, extracardiac: There was no pericardial effusion.  Impressions:  - Upper normal LV wall thickness with LVEF approximately 45-50% and   diffuse hypokinesis. Grade 2 diastolic dysfunction with increased   filling pressures. Moderate left atrial enlargement. There is   moderate to severe mitral regurgitation with eccentric jet.   Mechanism of mitral regurgitation is not clear, would suggest TEE   for further evaluation. Mild tricuspid regurgitation with   evidence of severe pulmonary hypertension and PASP 62 mmHg.  06/2017 TEE Study Conclusions  - Left ventricle: Systolic function was mildly reduced. The   estimated ejection fraction was in the range of 45% to 50%. Mild  diffuse hypokinesis with no identifiable regional variations. - Aortic valve: No evidence of vegetation. - Mitral valve: Thickening, consistent with rheumatic disease. No   evidence of vegetation. There was moderate regurgitation directed   centrally, eccentrically, and anteriorly. - Left atrium: The atrium was moderately dilated. No evidence of   thrombus in the atrial cavity or appendage. There was spontaneous   echo contrast (&quot;smoke&quot;). - Right atrium: No evidence of thrombus in the atrial cavity or   appendage. No evidence of thrombus in the atrial cavity or   appendage. - Atrial septum: No defect or patent foramen ovale was identified. - Tricuspid valve: No evidence of vegetation. There was   mild-moderate regurgitation directed centrally. - Pulmonic valve: No evidence of vegetation. No evidence of   vegetation. - Pulmonary arteries:  Systolic pressure was moderately increased.   PA peak pressure: 59 mm Hg (S).  Impressions:  - Although the MR jet is eccentric and could be underestimated by   color Doppler, there is consistent evidence for moderate severity   based on continuity equation-derived regurgitant fraction,   PISA-derived effective regurgitant orifice area and absence of   pulmonary vein systolic flow reversal.  06/2017 RHC/LHC  Prox RCA lesion, 70 %stenosed. Mid RCA lesion, 40 %stenosed. Dist RCA lesion, 40 %stenosed.  Ost 2nd Diag to 2nd Diag lesion, 90 %stenosed.  Cx-OM1 bifurcation: Prox Cx lesion, 80 %stenosed. Mid Cx lesion, 100 %stenosed. Ost 1st Mrg lesion, 70 %stenosed.  There is moderate left ventricular systolic dysfunction. The left ventricular ejection fraction is 35-45% by visual estimate.  LV end diastolic pressure is severely elevated.  There is moderate (3+) mitral regurgitation by LV Gram, but Lage V wave on PCWP would suggest Severe MR.  There is no aortic valve stenosis.  Hemodynamic findings consistent with severe pulmonary hypertension. Mostly Secondary to MR & elevated LVEDP.   Severe 2 Vessel disease (moderate to severe RCA and one or percent mid circumflex-OM occlusion) along with severe diffusely diseased very small caliber Diag branches and mild diffuse disease throughout the LAD.  Most likely at least moderate-severe mitral regurgitation based on large V wave.  Recommend CV surgery consultation to determine the best course of action would treating the patient's mitral valve and CAD.    Assessment and Plan  1. CAD - recent CABG, she is receoving well. - she reports not able to afford cardiac rehab - we will continue current meds. Medical therapy limited initially by low bp's  2. Pulmonary hypertension - evidence of pre and postcapillary pulm HTN by cath, suspect due to chronic left sided heart disease with secondary pulmonary vasculature remodeling - continue  management of left sided heart disease, volume management with diuretics - no indication for pulmonary vasodilators  3. Hypotension - low bp's after CABG, was discharged on midodrine. Normal bp's today, we will wean midodrine down to  bid  4. Leg swelling - right leg swelling after recent surgery, we will order a LE venous US to evaluate for DVT    F/u 2 months. Likely d/c midodrine at that time pending bp's, consider low dose ACE-I    Antoine Poche, M.D.

## 2017-08-04 NOTE — Patient Instructions (Signed)
Medication Instructions:  DECREASE MIDODRINE TO 5 MG TWO TIMES DAILY   Labwork: TODAY  Testing/Procedures: Your physician has requested that you have a lower or upper extremity venous duplex. This test is an ultrasound of the veins in the legs or arms. It looks at venous blood flow that carries blood from the heart to the legs or arms. Allow one hour for a Lower Venous exam. Allow thirty minutes for an Upper Venous exam. There are no restrictions or special instructions.     Follow-Up: Your physician recommends that you schedule a follow-up appointment in: 2 MONTHS    Any Other Special Instructions Will Be Listed Below (If Applicable).     If you need a refill on your cardiac medications before your next appointment, please call your pharmacy.

## 2017-08-05 ENCOUNTER — Telehealth (HOSPITAL_COMMUNITY): Payer: Self-pay

## 2017-08-05 NOTE — Telephone Encounter (Signed)
08/05/17  Called to schedule a wound treatment.  Left a message on son's cell #.... Offered him 8:15, 1:00, 5:30 appt.

## 2017-08-12 ENCOUNTER — Other Ambulatory Visit: Payer: Self-pay | Admitting: *Deleted

## 2017-08-12 DIAGNOSIS — G8918 Other acute postprocedural pain: Secondary | ICD-10-CM

## 2017-08-12 MED ORDER — TRAMADOL HCL 50 MG PO TABS: 50.0000 mg | ORAL_TABLET | Freq: Two times a day (BID) | ORAL | 0 refills | Status: DC | PRN

## 2017-08-24 ENCOUNTER — Other Ambulatory Visit: Payer: Self-pay | Admitting: Thoracic Surgery (Cardiothoracic Vascular Surgery)

## 2017-08-24 DIAGNOSIS — Z951 Presence of aortocoronary bypass graft: Secondary | ICD-10-CM

## 2017-08-25 ENCOUNTER — Ambulatory Visit (INDEPENDENT_AMBULATORY_CARE_PROVIDER_SITE_OTHER): Payer: Self-pay | Admitting: Thoracic Surgery (Cardiothoracic Vascular Surgery)

## 2017-08-25 ENCOUNTER — Ambulatory Visit
Admission: RE | Admit: 2017-08-25 | Discharge: 2017-08-25 | Disposition: A | Discharge status: Home / Self Care | Payer: Self-pay | Source: Ambulatory Visit | Attending: Thoracic Surgery (Cardiothoracic Vascular Surgery) | Admitting: Thoracic Surgery (Cardiothoracic Vascular Surgery)

## 2017-08-25 ENCOUNTER — Encounter: Payer: Self-pay | Admitting: Thoracic Surgery (Cardiothoracic Vascular Surgery)

## 2017-08-25 VITALS — BP 150/82 | HR 82 | Ht 62.0 in | Wt 214.0 lb

## 2017-08-25 DIAGNOSIS — Z951 Presence of aortocoronary bypass graft: Secondary | ICD-10-CM

## 2017-08-25 NOTE — Progress Notes (Signed)
[address]       Jill [name] [address]             [phone]      HPI: Jill [name] presents for a scheduled postop follow up visit  She is a 56 yo woman who is a 56 year old woman with multiple cardiac risk factors including previously uncontrolled diabetes.  She was admitted back in August 2018 with hyperkalemia.  She mentioned at that time she had been having some burning sensation in her chest.  Workup included an echocardiogram which showed an EF of 45-50% with moderate to severe mitral regurgitation.  At catheterization she was found to have three-vessel coronary disease.  She was initially treated medically and her heart failure improved significantly.  She underwent coronary bypass grafting x4 on 07/23/2017.  TEE at the time of surgery showed only mild mitral regurgitation.  It was noted to have diffusely diseased poor quality target vessels at the time of surgery.  She had some transient acute kidney injury postoperatively, which resolved prior to discharge.  She did have issues with hypotension and was discharged on Midodrine 10 mg 3 times daily.  She went home on day 6.  She saw Dr. Wyline Mood on 08/04/2017.  He decreased her midodrine to 5 mg 3 times daily.  She has not had any problems since that was done.   He feels well.  Her exercise tolerance is improving.  She has minimal pain.  She has a little swelling in her right leg but it is better than it was preoperatively.  Past Medical History:  Diagnosis Date  . Diabetic neuropathy (HCC)   . Dyspnea   . Dysrhythmia   . Headache   . Hyperlipidemia   . Hypertension   . Type 2 diabetes mellitus (HCC)     Current Outpatient Medications  Medication Sig Dispense Refill  . acetaminophen (TYLENOL) 500 MG tablet Take 2 tablets (1,000 mg total) by mouth every 6 (six) hours as needed. 30 tablet 0  . aspirin EC 81 MG tablet Take 81 mg by mouth daily.    . Dulaglutide (TRULICITY) 0.75 MG/0.5ML SOPN Inject 0.75 mg  into the skin every 7 (seven) days. tuesdays    . furosemide (LASIX) 40 MG tablet Take 1 tablet (40 mg total) by mouth daily. 30 tablet 0  . gabapentin (NEURONTIN) 400 MG capsule Take 800 mg by mouth 3 (three) times daily.     . insulin detemir (LEVEMIR) 100 UNIT/ML injection Inject 25 Units into the skin 2 (two) times daily.     . metoprolol succinate (TOPROL-XL) 25 MG 24 hr tablet Take 12.5 mg by mouth daily.    . rosuvastatin (CRESTOR) 20 MG tablet Take 20 mg by mouth at bedtime.     . senna-docusate (SENOKOT-S) 8.6-50 MG tablet Take 1 tablet by mouth at bedtime. 30 tablet 0  . traMADol (ULTRAM) 50 MG tablet Take 1 tablet (50 mg total) by mouth every 12 (twelve) hours as needed for moderate pain. 30 tablet 0  . vitamin C (ASCORBIC ACID) 500 MG tablet Take 500 mg by mouth daily.     No current facility-administered medications for this visit.     Physical Exam BP (!) 150/82 (BP Location: Left Arm, Patient Position: Sitting, Cuff Size: Normal)   Pulse 82   Ht 5\' 2"  (1.575 m)   Wt 214 lb (97.1 kg)   SpO2 99%   BMI 39.14 kg/m  Obese 56 year old woman in no  acute distress Alert and oriented x3 with no focal deficits Incision well-healed Sternum stable Cardiac regular rate and rhythm normal S1 and S2 Lungs clear with equal breath sounds bilaterally, no rales or wheezing 1+ edema right leg, no edema left leg  Diagnostic Tests: CHEST  2 VIEW  COMPARISON:  July 26, 2017  FINDINGS: There are rather minimal pleural effusions bilaterally. No edema or consolidation. Heart is upper normal in size with pulmonary vascularity within normal limits. No adenopathy. Patient is status post coronary artery bypass grafting. No pneumothorax. No bone lesions. Patient is status post coronary artery bypass grafting.  IMPRESSION: Rather minimal pleural effusions bilaterally. Lungs elsewhere clear. Stable cardiac silhouette.   Electronically Signed   By: Bretta BangWilliam  Woodruff III M.D.   On:  08/25/2017 10:31  Impression: Jill [name] is a 56 year old woman with multiple cardiac risk factors including poorly controlled diabetes who initially presented with hyperkalemia.  She was found to have three-vessel disease and moderate to severe mitral regurgitation.  She was initially treated medically and had significant improvement in her heart failure.  She underwent coronary bypass grafting x4 about a month ago.  Her postoperative course was relatively uncomplicated.  At the present time she is doing well.  She is in very good spirits.  Her exercise tolerance is improving.  She is having minimal discomfort.  She may begin driving on a limited basis.  Appropriate precautions were discussed.  She should not lift anything over 10 pounds until after Thanksgiving.  Her blood pressure was elevated today.  I stopped the midodrine.  No other medication changes were made.  Plan: [place].  I will be happy to see Jill [name] back any time if I can be of any further assistance with her care.  Loreli SlotSteven C Marisal Swarey, MD [facility] [phone]

## 2017-09-23 ENCOUNTER — Ambulatory Visit (HOSPITAL_COMMUNITY): Payer: Medicaid Other | Admitting: Physical Therapy

## 2017-09-29 ENCOUNTER — Ambulatory Visit (HOSPITAL_COMMUNITY): Payer: Medicaid Other | Admitting: Physical Therapy

## 2017-10-02 ENCOUNTER — Ambulatory Visit (HOSPITAL_COMMUNITY): Payer: Medicaid Other | Attending: *Deleted | Admitting: Physical Therapy

## 2017-10-02 ENCOUNTER — Encounter (HOSPITAL_COMMUNITY): Payer: Self-pay | Admitting: Physical Therapy

## 2017-10-02 DIAGNOSIS — E1122 Type 2 diabetes mellitus with diabetic chronic kidney disease: Secondary | ICD-10-CM | POA: Diagnosis not present

## 2017-10-02 DIAGNOSIS — N183 Chronic kidney disease, stage 3 (moderate): Secondary | ICD-10-CM | POA: Insufficient documentation

## 2017-10-02 DIAGNOSIS — Z794 Long term (current) use of insulin: Secondary | ICD-10-CM | POA: Diagnosis not present

## 2017-10-02 DIAGNOSIS — E785 Hyperlipidemia, unspecified: Secondary | ICD-10-CM | POA: Insufficient documentation

## 2017-10-02 DIAGNOSIS — S91301S Unspecified open wound, right foot, sequela: Secondary | ICD-10-CM | POA: Diagnosis not present

## 2017-10-02 DIAGNOSIS — I4892 Unspecified atrial flutter: Secondary | ICD-10-CM | POA: Diagnosis not present

## 2017-10-02 DIAGNOSIS — I429 Cardiomyopathy, unspecified: Secondary | ICD-10-CM | POA: Insufficient documentation

## 2017-10-02 DIAGNOSIS — Z951 Presence of aortocoronary bypass graft: Secondary | ICD-10-CM | POA: Diagnosis not present

## 2017-10-02 DIAGNOSIS — L97413 Non-pressure chronic ulcer of right heel and midfoot with necrosis of muscle: Secondary | ICD-10-CM | POA: Insufficient documentation

## 2017-10-02 DIAGNOSIS — I2510 Atherosclerotic heart disease of native coronary artery without angina pectoris: Secondary | ICD-10-CM | POA: Diagnosis not present

## 2017-10-02 DIAGNOSIS — E1165 Type 2 diabetes mellitus with hyperglycemia: Secondary | ICD-10-CM | POA: Insufficient documentation

## 2017-10-02 DIAGNOSIS — E11621 Type 2 diabetes mellitus with foot ulcer: Secondary | ICD-10-CM | POA: Diagnosis not present

## 2017-10-02 DIAGNOSIS — I340 Nonrheumatic mitral (valve) insufficiency: Secondary | ICD-10-CM | POA: Insufficient documentation

## 2017-10-02 DIAGNOSIS — E1140 Type 2 diabetes mellitus with diabetic neuropathy, unspecified: Secondary | ICD-10-CM | POA: Diagnosis not present

## 2017-10-02 DIAGNOSIS — E875 Hyperkalemia: Secondary | ICD-10-CM | POA: Insufficient documentation

## 2017-10-02 NOTE — Therapy (Signed)
[place] [facility] [address] [place], [place], [address] Phone: [phone]   Fax:  [phone]  Wound Care Evaluation  Patient Details  Name: Jill [name] MRN: [id number] Date of Birth: 1961-08-05 Referring Provider: Kizzie Furnish    Encounter Date: 10/02/2017  PT End of Session - 10/02/17 1612    Visit Number  1    Number of Visits  6    Date for PT Re-Evaluation  11/01/17    Authorization Type  no insurance    PT Start Time  1500    PT Stop Time  1530    PT Time Calculation (min)  30 min    Activity Tolerance  Other (comment) debridement and dressing change        Past Medical History:  Diagnosis Date  . Diabetic neuropathy (HCC)   . Dyspnea   . Dysrhythmia   . Headache   . Hyperlipidemia   . Hypertension   . Type 2 diabetes mellitus (HCC)     Past Surgical History:  Procedure Laterality Date  . CORONARY ARTERY BYPASS GRAFT N/A 07/22/2017   Procedure: CORONARY ARTERY BYPASS GRAFTING (CABG) x four, using left internal mammary artery and right leg greater saphenous vein harvested endoscopically;  Surgeon: Loreli Slot, MD;  Location: [facility] OR;  Service: Open Heart Surgery;  Laterality: N/A;  . RIGHT/LEFT HEART CATH AND CORONARY ANGIOGRAPHY N/A 06/26/2017   Procedure: RIGHT/LEFT HEART CATH AND CORONARY ANGIOGRAPHY;  Surgeon: Marykay Lex, MD;  Location: [facility] INVASIVE CV LAB;  Service: Cardiovascular;  Laterality: N/A;  . TEE WITHOUT CARDIOVERSION N/A 06/25/2017   Procedure: TRANSESOPHAGEAL ECHOCARDIOGRAM (TEE);  Surgeon: Thurmon Fair, MD;  Location: [facility] ENDOSCOPY;  Service: Cardiovascular;  Laterality: N/A;  . TEE WITHOUT CARDIOVERSION N/A 07/22/2017   Procedure: TRANSESOPHAGEAL ECHOCARDIOGRAM (TEE);  Surgeon: Loreli Slot, MD;  Location: [facility] OR;  Service: Open Heart Surgery;  Laterality: N/A;  . TUBAL LIGATION      There were no vitals filed for this visit.    [facility] PT Assessment - 10/02/17 0001      Assessment   Medical Diagnosis  nonhealing wound     Referring Provider  Rochelle Muse     Onset Date/Surgical Date  04/13/17    Next MD Visit  unknown     Prior Therapy  antibiotic, self care       Precautions   Precautions  -- infection       Restrictions   Weight Bearing Restrictions  No      Balance Screen   Has the patient fallen in the past 6 months  No    Has the patient had a decrease in activity level because of a fear of falling?   No    Is the patient reluctant to leave their home because of a fear of falling?   No      Prior Function   Level of Independence  Independent      Cognition   Overall Cognitive Status  Within Functional Limits for tasks assessed      Wound Therapy - 10/02/17 1600    Subjective  Pt states that she feels much better.  She had a triple bypass on 07/22/2017 which is why she has not been to therapy since 07/21/2017.  She has been dressing her wound on her own but is afraid it hasn't improved.    Patient and Family Stated Goals  wounds to heal     Date of Onset  04/13/17    Pain Assessment  No/denies pain    Evaluation and Treatment Procedures Explained to Patient/Family  Yes    Evaluation and Treatment Procedures  agreed to    Pressure Injury Properties Date First Assessed: 07/06/17 Time First Assessed: 1318 Location: Heel Location Orientation: Right Staging: - Present on Admission: Yes   Dressing Type  Adhesive bandage    Dressing  Changed    Dressing Change Frequency  PRN    State of Healing  Non-healing wound has undermining in all directions which was debrided      Site / Wound Assessment  Granulation tissue    % Wound base Red or Granulating  90% after debridement     % Wound base Yellow/Fibrinous Exudate  10%    Peri-wound Assessment  Edema    Wound Length (cm)  0.5 cm was .7    Wound Width (cm)  0.9 cm was  .8    Wound Depth (cm)  0.25 cm was .1    Wound Surface Area (cm^2)  0.45 cm^2    Wound Volume (cm^3)  0.11 cm^3    Undermining  (cm)  -- was undermining  all directilons no undermining after debrid    Margins  -- heel tissue feels mushy     Drainage Amount  Scant    Drainage Description  Serous    Treatment  Cleansed;Debridement (Selective)    Selective Debridement - Location  wound bed and all edges     Selective Debridement - Tools Used  Forceps;Scissors    Selective Debridement - Tissue Removed  devitalized tissue, slough , epiboled edges     Wound Therapy - Clinical Statement  Pt has not been to clinic sinc 10/2 /2018 due to having a triple bypass.  Pt is feeling much better but her wound still has not healed.  There is no sign of infection but granulation is limited and the wound is undermining.  Ms. Mariah MillingMorales will benefit from skilled   PT  once a week for debridement and measurement and to assess wound to make sure that there is no infection.  Wound will heal slowly due to pt having uncontrolled diabetes.      Wound Therapy - Functional Problem List  difficulty walking     Factors Delaying/Impairing Wound Healing  Altered sensation;Diabetes Mellitus;Multiple medical problems;Polypharmacy;Vascular compromise    Hydrotherapy Plan  Debridement;Dressing change;Patient/family education    Wound Therapy - Frequency  Other (comment) 1  x a week for 6 weeks     Wound Therapy - Current Recommendations  PT    Wound Plan  PT     Dressing   medihoney to wound followed by vaseling to heel.  Dressed with 2x2 and kling with netting to hold dressing in place.              Objective measurements completed on examination: See above findings.            PT Education - 10/02/17 1610    Education provided  Yes    Education Details  keep dressing dry; cleanse wound and redress with medihoney<(given to pt) , every other day.     Person(s) Educated  Patient    Methods  Explanation    Comprehension  Verbalized understanding       PT Short Term Goals - 10/02/17 1616      PT SHORT TERM GOAL #1   Title  Wounds to be  100% granulated to prevent infection  Time  2    Period  Weeks    Status  New    Target Date  10/16/17      PT SHORT TERM GOAL #2   Title  PT to verbalize the importance of visually checking her feet everyday for any wound     Time  2    Period  Weeks    Status  New      PT SHORT TERM GOAL #3   Title  wound to depth to have decreased to .1 cm to allow closure of woung.     Time  3    Period  Weeks    Status  New    Target Date  10/23/17        PT Long Term Goals - 10/02/17 1617      PT LONG TERM GOAL #1   Title  Pt wound size to be no greater than .3x.3 x.1 to allow pt to feel comfortable with discharge from wound care.     Time  6    Period  Weeks    Status  New    Target Date  11/06/17           Plan - 10/02/17 1613    Clinical Impression Statement  see above     Clinical Presentation  Stable    Clinical Decision Making  Low    Rehab Potential  Good    PT Frequency  1x / week    PT Duration  6 weeks    PT Treatment/Interventions  Other (comment)    PT Next Visit Plan  debridement and dressing change:  measure wound each visit.     Consulted and Agree with Plan of Care  Patient       Patient will benefit from skilled therapeutic intervention in order to improve the following deficits and impairments:  Other (comment)(nonhealing wound)  Visit Diagnosis: Diabetic ulcer of right heel associated with type 2 diabetes mellitus, with necrosis of muscle (HCC)    Problem List Patient Active Problem List   Diagnosis Date Noted  . 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 Dyspnea   . Coronary artery disease   . Severe mitral insufficiency   . Atrial flutter (HCC)   . Mitral regurgitation 06/24/2017  . Cardiomyopathy (HCC) 06/20/2017  . Elevated troponin 06/19/2017  . CKD (chronic kidney disease) stage 3, GFR 30-59 ml/min (HCC) 06/19/2017  . Pressure injury of skin 06/19/2017  . Hyperkalemia 06/18/2017  . Type 2 diabetes mellitus, uncontrolled, with neuropathy (HCC)  06/18/2017  . Hyperlipidemia 06/18/2017  . Chest pain 06/18/2017  . Bronchopneumonia 03/06/2016  . CAP (community acquired pneumonia) 03/05/2016  . Insulin dependent diabetes mellitus (HCC) 03/05/2016  . Essential hypertension 03/05/2016  . Hyponatremia 03/05/2016  . Normocytic anemia 03/05/2016  . Hyperglycemia 03/05/2016  . Dehydration 03/05/2016  . Hypoxia 03/05/2016  . Leukocytosis 03/05/2016  . Acute respiratory failure with hypoxia [facility]) 03/05/2016  Virgina Organ, PT CLT [phone] 10/02/2017, 4:21 PM  [place] [facility] [address] [place], [place], [address] Phone: [phone]   Fax:  [phone]  Name: Marbella Watchman MRN: [id number] Date of Birth: 1961-07-11

## 2017-10-04 NOTE — Progress Notes (Signed)
Cardiology Office Note   Date:  10/05/2017   ID:  Jill [name], DOB 08/27/1961, MRN [id number]  [facility]  Cardiologist:  Dina RichJonathan Branch, MD Chief Complaint  Patient presents with  . Coronary Artery Disease     History of Present Illness: Jill [name] is a 56 y.o. female who presents for ongoing assessment and management of CAD, hx of CABG, 07/21/2017  (LIMA to LAD, SVG to Diagonal, SVG to OM2, SVG to PDA.), pulmonary hypertension, mitral regurg, and chronic right leg edema.   On last office visit with Dr. Wyline MoodBranch she was doing well and was without cardiac complaints. She refused cardiac rehab due to cost. She was continued on current medication regimen with the exception of weaning down of midodrine. Due to unilateral LEE venous ultrasound was ordered.  There was no evidence of DVT found.   She is here today without any cardiac complaints.  She is very grateful for her bypass grafting as she is feeling much better is able to be more active, her energy level is coming back, and she is not feeling any more chest discomfort.  She is medically compliant.  She denies any tolerance to the medications.  Past Medical History:  Diagnosis Date  . Diabetic neuropathy (HCC)   . Dyspnea   . Dysrhythmia   . Headache   . Hyperlipidemia   . Hypertension   . Type 2 diabetes mellitus (HCC)     Past Surgical History:  Procedure Laterality Date  . CORONARY ARTERY BYPASS GRAFT N/A 07/22/2017   Procedure: CORONARY ARTERY BYPASS GRAFTING (CABG) x four, using left internal mammary artery and right leg greater saphenous vein harvested endoscopically;  Surgeon: Loreli SlotHendrickson, Steven C, MD;  [facility];  Service: Open Heart Surgery;  Laterality: N/A;  . RIGHT/LEFT HEART CATH AND CORONARY ANGIOGRAPHY N/A 06/26/2017   Procedure: RIGHT/LEFT HEART CATH AND CORONARY ANGIOGRAPHY;  Surgeon: Marykay LexHarding, David W, MD;  [facility];  Service: Cardiovascular;  Laterality:  N/A;  . TEE WITHOUT CARDIOVERSION N/A 06/25/2017   Procedure: TRANSESOPHAGEAL ECHOCARDIOGRAM (TEE);  Surgeon: Thurmon Fairroitoru, Mihai, MD;  [facility];  Service: Cardiovascular;  Laterality: N/A;  . TEE WITHOUT CARDIOVERSION N/A 07/22/2017   Procedure: TRANSESOPHAGEAL ECHOCARDIOGRAM (TEE);  Surgeon: Loreli SlotHendrickson, Steven C, MD;  [facility];  Service: Open Heart Surgery;  Laterality: N/A;  . TUBAL LIGATION       Current Outpatient Medications  Medication Sig Dispense Refill  . acetaminophen (TYLENOL) 500 MG tablet Take 2 tablets (1,000 mg total) by mouth every 6 (six) hours as needed. 30 tablet 0  . aspirin EC 81 MG tablet Take 81 mg by mouth daily.    . Dulaglutide (TRULICITY) 0.75 MG/0.5ML SOPN Inject 0.75 mg into the skin every 7 (seven) days. tuesdays    . gabapentin (NEURONTIN) 300 MG capsule Take 300 mg by mouth 4 (four) times daily.    . insulin detemir (LEVEMIR) 100 UNIT/ML injection Inject 25 Units into the skin 2 (two) times daily.     . metoprolol succinate (TOPROL-XL) 25 MG 24 hr tablet Take 0.5 tablets (12.5 mg total) by mouth daily. 45 tablet 3  . rosuvastatin (CRESTOR) 20 MG tablet Take 1 tablet (20 mg total) by mouth at bedtime. 90 tablet 3  . silver sulfADIAZINE (SILVADENE) 1 % cream Apply 1 application topically daily.    . vitamin C (ASCORBIC ACID) 500 MG tablet Take 500 mg by mouth daily.     No current facility-administered medications  for this visit.     Allergies:   Patient has no known allergies.    Social History:  The patient  reports that  has never smoked. she has never used smokeless tobacco. She reports that she does not drink alcohol or use drugs.   Family History:  The patient's family history includes Diabetes type II in her other; Hypertension in her other.    ROS: All other systems are reviewed and negative. Unless otherwise mentioned in H&P    PHYSICAL EXAM: VS:  BP (!) 148/64   Pulse 81   Ht 5\' 2"  (1.575 m)   Wt 221 lb 3.2 oz (100.3 kg)    SpO2 98% Comment: on room air  BMI 40.46 kg/m  , BMI Body mass index is 40.46 kg/m. GEN: Well nourished, well developed, in no acute distress  HEENT: normal  Neck: no JVD, carotid bruits, or masses Cardiac: RRR; no murmurs, rubs, or gallops,no edema  Respiratory:  clear to auscultation bilaterally, normal work of breathing GI: soft, nontender, nondistended, + BS MS: no deformity or atrophy well-healed sternotomy incision  skin: warm and dry, no rash Neuro:  Strength and sensation are intact Psych: euthymic mood, full affect  Recent Labs: 07/20/2017: ALT 15 07/23/2017: Magnesium 2.5 07/27/2017: Hemoglobin 8.5; Platelets 243 07/28/2017: BUN 20; Creatinine, Ser 1.08; Potassium 4.7; Sodium 139    Lipid Panel    Component Value Date/Time   CHOL 105 06/25/2017 0532   TRIG 69 06/25/2017 0532   HDL 33 (L) 06/25/2017 0532   CHOLHDL 3.2 06/25/2017 0532   VLDL 14 06/25/2017 0532   LDLCALC 58 06/25/2017 0532      Wt Readings from Last 3 Encounters:  10/05/17 221 lb 3.2 oz (100.3 kg)  08/25/17 214 lb (97.1 kg)  08/04/17 217 lb 9.6 oz (98.7 kg)      Other studies Reviewed: Echocardiogram 07-05-2017 Left ventricle: The cavity size was normal. Wall thickness was at   the upper limits of normal. Systolic function was mildly reduced.   The estimated ejection fraction was in the range of 45% to 50%.   Diffuse hypokinesis. Features are consistent with a pseudonormal   left ventricular filling pattern, with concomitant abnormal   relaxation and increased filling pressure (grade 2 diastolic   dysfunction). - Mitral valve: There was moderate to severe regurgitation. - Left atrium: The atrium was moderately dilated. - Right atrium: Central venous pressure (est): 3 mm Hg. - Atrial septum: No defect or patent foramen ovale was identified. - Tricuspid valve: There was mild regurgitation. - Pulmonary arteries: Systolic pressure was severely increased. PA   peak pressure: 62 mm Hg (S). -  Pericardium, extracardiac: There was no pericardial effusion.   ASSESSMENT AND PLAN:  1.  Coronary artery disease: Status post coronary artery bypass grafting October 2018.  She is doing very well is asymptomatic is gained more energy and tolerating medication regimen.  We will not make any changes on her medications at this time.  Refills are provided for metoprolol and rosuvastatin   2.  Systolic dysfunction: Reduced systolic function 45% to 50%.  Follow-up echo can be done after being seen in 6 months to evaluate for improvement in LV function.  3.   Diabetes: She continues to be followed by primary care physician with ongoing labs and medication adjustment.  Current medicines are reviewed at length with the patient today.    Labs/ tests ordered today include:  Bettey Mare. Liborio Nixon, ANP, AACC   10/05/2017 1:39 PM  [place] [address] [place], [place] [address] Phone: [phone]; Fax: [phone]

## 2017-10-05 ENCOUNTER — Ambulatory Visit (HOSPITAL_COMMUNITY): Payer: Medicaid Other | Admitting: Physical Therapy

## 2017-10-05 ENCOUNTER — Ambulatory Visit (INDEPENDENT_AMBULATORY_CARE_PROVIDER_SITE_OTHER): Payer: Medicaid Other | Admitting: Adult Health

## 2017-10-05 ENCOUNTER — Encounter: Payer: Self-pay | Admitting: Adult Health

## 2017-10-05 VITALS — BP 148/64 | HR 81 | Ht 62.0 in | Wt 221.2 lb

## 2017-10-05 DIAGNOSIS — L97413 Non-pressure chronic ulcer of right heel and midfoot with necrosis of muscle: Principal | ICD-10-CM

## 2017-10-05 DIAGNOSIS — I43 Cardiomyopathy in diseases classified elsewhere: Secondary | ICD-10-CM | POA: Diagnosis not present

## 2017-10-05 DIAGNOSIS — I2510 Atherosclerotic heart disease of native coronary artery without angina pectoris: Secondary | ICD-10-CM | POA: Diagnosis not present

## 2017-10-05 DIAGNOSIS — E11621 Type 2 diabetes mellitus with foot ulcer: Secondary | ICD-10-CM

## 2017-10-05 MED ORDER — ROSUVASTATIN CALCIUM 20 MG PO TABS: 20.0000 mg | ORAL_TABLET | Freq: Every day | ORAL | 3 refills | Status: DC

## 2017-10-05 MED ORDER — METOPROLOL SUCCINATE ER 25 MG PO TB24: 12.5000 mg | ORAL_TABLET | Freq: Every day | ORAL | 3 refills | Status: DC

## 2017-10-05 NOTE — Therapy (Signed)
[facility] [address] [place], [place], [address] Phone: [phone]   Fax:  [phone]  Wound Care Therapy  Patient Details  Name: Jill [name] MRN: [id number] Date of Birth: February 10, 1961 Referring Provider: Kizzie Furnish    Encounter Date: 10/05/2017  PT End of Session - 10/05/17 1552    Visit Number  2    Number of Visits  6    Date for PT Re-Evaluation  11/01/17    Authorization Type  no insurance    PT Start Time  1518    PT Stop Time  1545    PT Time Calculation (min)  27 min    Activity Tolerance  Other (comment) debridement and dressing change        Past Medical History:  Diagnosis Date  . Diabetic neuropathy (HCC)   . Dyspnea   . Dysrhythmia   . Headache   . Hyperlipidemia   . Hypertension   . Type 2 diabetes mellitus (HCC)     Past Surgical History:  Procedure Laterality Date  . CORONARY ARTERY BYPASS GRAFT N/A 07/22/2017   Procedure: CORONARY ARTERY BYPASS GRAFTING (CABG) x four, using left internal mammary artery and right leg greater saphenous vein harvested endoscopically;  Surgeon: Loreli Slot, MD;  Location: [facility] OR;  Service: Open Heart Surgery;  Laterality: N/A;  . RIGHT/LEFT HEART CATH AND CORONARY ANGIOGRAPHY N/A 06/26/2017   Procedure: RIGHT/LEFT HEART CATH AND CORONARY ANGIOGRAPHY;  Surgeon: Marykay Lex, MD;  Location: [facility] INVASIVE CV LAB;  Service: Cardiovascular;  Laterality: N/A;  . TEE WITHOUT CARDIOVERSION N/A 06/25/2017   Procedure: TRANSESOPHAGEAL ECHOCARDIOGRAM (TEE);  Surgeon: Thurmon Fair, MD;  Location: [facility] ENDOSCOPY;  Service: Cardiovascular;  Laterality: N/A;  . TEE WITHOUT CARDIOVERSION N/A 07/22/2017   Procedure: TRANSESOPHAGEAL ECHOCARDIOGRAM (TEE);  Surgeon: Loreli Slot, MD;  Location: [facility] OR;  Service: Open Heart Surgery;  Laterality: N/A;  . TUBAL LIGATION      There were no vitals filed for this visit.              Wound Therapy - 10/05/17 1547     Subjective  Pt states no issues.  comes today with bandaid on heel.    Patient and Family Stated Goals  wounds to heal     Date of Onset  04/13/17    Pain Assessment  No/denies pain    Evaluation and Treatment Procedures Explained to Patient/Family  Yes    Evaluation and Treatment Procedures  agreed to    Pressure Injury Properties Date First Assessed: 07/06/17 Time First Assessed: 1318 Location: Heel Location Orientation: Right Staging: - Present on Admission: Yes   Dressing Type  Adhesive bandage    Dressing  Changed    Dressing Change Frequency  PRN    State of Healing  Non-healing wound has undermining in all directions which was debrided      Site / Wound Assessment  Granulation tissue    % Wound base Red or Granulating  90% after debridement     % Wound base Yellow/Fibrinous Exudate  10%    Peri-wound Assessment  Maceration    Margins  -- heel tissue feels mushy     Drainage Amount  Scant    Drainage Description  Serous    Treatment  Cleansed;Debridement (Selective)    Selective Debridement - Location  wound bed and all edges     Selective Debridement - Tools Used  Forceps;Scissors    Selective Debridement - Tissue  Removed  devitalized tissue, slough , epiboled edges     Wound Therapy - Clinical Statement  Pt with bandaid over wound causing maceration perimeter of wound thus interferring with approximation.  Educated patient on using only gauze and medipore tape.  Pt given samples and instruction on how to use.  Instructed to only change if needed, otherwise to keep dressing in place.  Pt verbalized understanding.  Able to remove most of the epibole tissue around wound.  Continued with medihoney but may need to change if continues to have maceration.      Wound Therapy - Functional Problem List  difficulty walking     Factors Delaying/Impairing Wound Healing  Altered sensation;Diabetes Mellitus;Multiple medical problems;Polypharmacy;Vascular compromise    Hydrotherapy Plan   Debridement;Dressing change;Patient/family education    Wound Therapy - Frequency  Other (comment) 1  x a week for 6 weeks     Wound Therapy - Current Recommendations  PT    Wound Plan  Continue with woundcare.  Change dressing if needed to promote healing environment.      Dressing   medihoney on 2X2, medipore tape              PT Education - 10/05/17 1553    Education provided  Yes    Education Details  not using a bandaid as making perimeter too macerated.  Instructions on using 2X2 and medipore tape, only to dress or change if needed.    Person(s) Educated  Patient    Methods  Explanation    Comprehension  Verbalized understanding       PT Short Term Goals - 10/02/17 1616      PT SHORT TERM GOAL #1   Title  Wounds to be 100% granulated to prevent infection     Time  2    Period  Weeks    Status  New    Target Date  10/16/17      PT SHORT TERM GOAL #2   Title  PT to verbalize the importance of visually checking her feet everyday for any wound     Time  2    Period  Weeks    Status  New      PT SHORT TERM GOAL #3   Title  wound to depth to have decreased to .1 cm to allow closure of woung.     Time  3    Period  Weeks    Status  New    Target Date  10/23/17        PT Long Term Goals - 10/02/17 1617      PT LONG TERM GOAL #1   Title  Pt wound size to be no greater than .3x.3 x.1 to allow pt to feel comfortable with discharge from wound care.     Time  6    Period  Weeks    Status  New    Target Date  11/06/17              Patient will benefit from skilled therapeutic intervention in order to improve the following deficits and impairments:     Visit Diagnosis: Diabetic ulcer of right heel associated with type 2 diabetes mellitus, with necrosis of muscle [facility](HCC)     Problem List Patient Active Problem List   Diagnosis Date Noted  . 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 Dyspnea   . Coronary artery disease   . Severe mitral insufficiency   . Atrial  flutter (HCC)   .  Mitral regurgitation 06/24/2017  . Cardiomyopathy (HCC) 06/20/2017  . Elevated troponin 06/19/2017  . CKD (chronic kidney disease) stage 3, GFR 30-59 ml/min (HCC) 06/19/2017  . Pressure injury of skin 06/19/2017  . Hyperkalemia 06/18/2017  . Type 2 diabetes mellitus, uncontrolled, with neuropathy (HCC) 06/18/2017  . Hyperlipidemia 06/18/2017  . Chest pain 06/18/2017  . Bronchopneumonia 03/06/2016  . CAP (community acquired pneumonia) 03/05/2016  . Insulin dependent diabetes mellitus (HCC) 03/05/2016  . Essential hypertension 03/05/2016  . Hyponatremia 03/05/2016  . Normocytic anemia 03/05/2016  . Hyperglycemia 03/05/2016  . Dehydration 03/05/2016  . Hypoxia 03/05/2016  . Leukocytosis 03/05/2016  . Acute respiratory failure with hypoxia (HCC) 03/05/2016   Lurena Nida, PTA/CLT [phone]  Lurena Nida 10/05/2017, 3:53 PM  [facility] [address] [place], [place], [address] Phone: [phone]   Fax:  [phone]  Name: Jill [name] MRN: [id number] Date of Birth: 02-14-1961

## 2017-10-05 NOTE — Patient Instructions (Signed)
Your physician wants you to follow-up in: 6 months You will receive a reminder letter in the mail two months in advance. If you don't receive a letter, please call our office to schedule the follow-up appointment.    Your physician recommends that you continue on your current medications as directed. Please refer to the Current Medication list given to you today.     If you need a refill on your cardiac medications before your next appointment, please call your pharmacy.     No lab work or tests ordered      Thank you for choosing [facility] !

## 2017-10-08 ENCOUNTER — Ambulatory Visit (HOSPITAL_COMMUNITY): Payer: Medicaid Other | Admitting: Physical Therapy

## 2017-10-08 DIAGNOSIS — E11621 Type 2 diabetes mellitus with foot ulcer: Secondary | ICD-10-CM

## 2017-10-08 DIAGNOSIS — L97413 Non-pressure chronic ulcer of right heel and midfoot with necrosis of muscle: Principal | ICD-10-CM

## 2017-10-08 NOTE — Therapy (Signed)
[facility] [address] [place], [place], [address] Phone: [phone]   Fax:  [phone]  Wound Care Therapy  Patient Details  Name: Jill [name] MRN: [id number] Date of Birth: 09/19/1961 Referring Provider: Kizzie Furnish    Encounter Date: 10/08/2017  PT End of Session - 10/08/17 1814    Visit Number  3    Number of Visits  6    Date for PT Re-Evaluation  11/01/17    Authorization Type  no insurance    PT Start Time  1525    PT Stop Time  1550    PT Time Calculation (min)  25 min    Activity Tolerance  Other (comment) debridement and dressing change        Past Medical History:  Diagnosis Date  . Diabetic neuropathy (HCC)   . Dyspnea   . Dysrhythmia   . Headache   . Hyperlipidemia   . Hypertension   . Type 2 diabetes mellitus (HCC)     Past Surgical History:  Procedure Laterality Date  . CORONARY ARTERY BYPASS GRAFT N/A 07/22/2017   Procedure: CORONARY ARTERY BYPASS GRAFTING (CABG) x four, using left internal mammary artery and right leg greater saphenous vein harvested endoscopically;  Surgeon: Loreli Slot, MD;  Location: [facility] OR;  Service: Open Heart Surgery;  Laterality: N/A;  . RIGHT/LEFT HEART CATH AND CORONARY ANGIOGRAPHY N/A 06/26/2017   Procedure: RIGHT/LEFT HEART CATH AND CORONARY ANGIOGRAPHY;  Surgeon: Marykay Lex, MD;  Location: [facility] INVASIVE CV LAB;  Service: Cardiovascular;  Laterality: N/A;  . TEE WITHOUT CARDIOVERSION N/A 06/25/2017   Procedure: TRANSESOPHAGEAL ECHOCARDIOGRAM (TEE);  Surgeon: Thurmon Fair, MD;  Location: [facility] ENDOSCOPY;  Service: Cardiovascular;  Laterality: N/A;  . TEE WITHOUT CARDIOVERSION N/A 07/22/2017   Procedure: TRANSESOPHAGEAL ECHOCARDIOGRAM (TEE);  Surgeon: Loreli Slot, MD;  Location: [facility] OR;  Service: Open Heart Surgery;  Laterality: N/A;  . TUBAL LIGATION      There were no vitals filed for this visit.              Wound Therapy - 10/08/17 1810     Subjective  Pt doing well without pain.  comes today without dressing on heel but reports she just removed it.      Patient and Family Stated Goals  wounds to heal     Date of Onset  04/13/17    Pain Assessment  No/denies pain    Evaluation and Treatment Procedures Explained to Patient/Family  Yes    Evaluation and Treatment Procedures  agreed to    Pressure Injury Properties Date First Assessed: 07/06/17 Time First Assessed: 1318 Location: Heel Location Orientation: Right Staging: - Present on Admission: Yes   Dressing Type  Adhesive bandage    Dressing  Changed    Dressing Change Frequency  PRN    State of Healing  Non-healing wound has undermining in all directions which was debrided      Site / Wound Assessment  Granulation tissue    % Wound base Red or Granulating  90% after debridement     % Wound base Yellow/Fibrinous Exudate  10%    Peri-wound Assessment  Intact dry    Wound Length (cm)  1 cm    Wound Width (cm)  1 cm    Wound Depth (cm)  0.1 cm    Wound Surface Area (cm^2)  1 cm^2    Wound Volume (cm^3)  0.1 cm^3    Margins  -- heel  tissue feels mushy     Drainage Amount  Scant    Drainage Description  Serous    Treatment  Cleansed;Debridement (Selective)    Selective Debridement - Location  wound bed and all edges     Selective Debridement - Tools Used  Forceps;Scissors    Selective Debridement - Tissue Removed  devitalized tissue, slough , epiboled edges     Wound Therapy - Clinical Statement  More undermining present around the edges, removed with forceps and scissors to reveal larger wound bed.  Overall remains granulated but undermining preventing approximation.  Measured with increased size, however due to removal of devitalized tissue.  pt tolerated well.  Continued with medihoney gel.  May change to xeroform next session if remains too dry.     Wound Therapy - Functional Problem List  difficulty walking     Factors Delaying/Impairing Wound Healing  Altered  sensation;Diabetes Mellitus;Multiple medical problems;Polypharmacy;Vascular compromise    Hydrotherapy Plan  Debridement;Dressing change;Patient/family education    Wound Therapy - Frequency  Other (comment) 1  x a week for 6 weeks     Wound Therapy - Current Recommendations  PT    Wound Plan  Continue with woundcare.  Change dressing if needed to promote healing environment.      Dressing   medihoney on 2X2, medipore tape                PT Short Term Goals - 10/02/17 1616      PT SHORT TERM GOAL #1   Title  Wounds to be 100% granulated to prevent infection     Time  2    Period  Weeks    Status  New    Target Date  10/16/17      PT SHORT TERM GOAL #2   Title  PT to verbalize the importance of visually checking her feet everyday for any wound     Time  2    Period  Weeks    Status  New      PT SHORT TERM GOAL #3   Title  wound to depth to have decreased to .1 cm to allow closure of woung.     Time  3    Period  Weeks    Status  New    Target Date  10/23/17        PT Long Term Goals - 10/02/17 1617      PT LONG TERM GOAL #1   Title  Pt wound size to be no greater than .3x.3 x.1 to allow pt to feel comfortable with discharge from wound care.     Time  6    Period  Weeks    Status  New    Target Date  11/06/17              Patient will benefit from skilled therapeutic intervention in order to improve the following deficits and impairments:     Visit Diagnosis: Diabetic ulcer of right heel associated with type 2 diabetes mellitus, with necrosis of muscle [facility])     Problem List Patient Active Problem List   Diagnosis Date Noted  . 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 Dyspnea   . Coronary artery disease   . Severe mitral insufficiency   . Atrial flutter (HCC)   . Mitral regurgitation 06/24/2017  . Cardiomyopathy (HCC) 06/20/2017  . Elevated troponin 06/19/2017  . CKD (chronic kidney disease) stage 3, GFR 30-59 ml/min (HCC) 06/19/2017  . Pressure  injury  of skin 06/19/2017  . Hyperkalemia 06/18/2017  . Type 2 diabetes mellitus, uncontrolled, with neuropathy (HCC) 06/18/2017  . Hyperlipidemia 06/18/2017  . Chest pain 06/18/2017  . Bronchopneumonia 03/06/2016  . CAP (community acquired pneumonia) 03/05/2016  . Insulin dependent diabetes mellitus (HCC) 03/05/2016  . Essential hypertension 03/05/2016  . Hyponatremia 03/05/2016  . Normocytic anemia 03/05/2016  . Hyperglycemia 03/05/2016  . Dehydration 03/05/2016  . Hypoxia 03/05/2016  . Leukocytosis 03/05/2016  . Acute respiratory failure with hypoxia (HCC) 03/05/2016   Lurena NidaAmy B Elina Streng, PTA/CLT [phone]  Lurena NidaFrazier, Najla Aughenbaugh B 10/08/2017, 6:14 PM  [facility] [address] [place], [place], [address] Phone: [phone]   Fax:  [phone]  Name: Jill [name] MRN: [id number] Date of Birth: Jan 03, 1961

## 2017-10-12 ENCOUNTER — Encounter (HOSPITAL_COMMUNITY): Payer: Self-pay | Admitting: Physical Therapy

## 2017-10-12 ENCOUNTER — Ambulatory Visit (HOSPITAL_COMMUNITY): Payer: Medicaid Other | Admitting: Physical Therapy

## 2017-10-12 DIAGNOSIS — E11621 Type 2 diabetes mellitus with foot ulcer: Secondary | ICD-10-CM

## 2017-10-12 DIAGNOSIS — L97413 Non-pressure chronic ulcer of right heel and midfoot with necrosis of muscle: Principal | ICD-10-CM

## 2017-10-12 NOTE — Therapy (Signed)
[place], [place], [address] Phone: [phone]   Fax:  [phone]  Wound Care Therapy  Patient Details  Name: Jill [name] MRN: [id number] Date of Birth: [birth year] Referring Provider: Royce Macadamia    Encounter Date: 10/12/2017  PT End of Session - 10/12/17 1026    Visit Number  4    Number of Visits  6    Date for PT Re-Evaluation  11/01/17    Authorization Type  no insurance    Authorization - Visit Number  81    PT Start Time  [phone]    PT Stop Time  1015    PT Time Calculation (min)  25 min    Activity Tolerance  Other (comment) debridement and dressing change        Past Medical History:  Diagnosis Date  . Diabetic neuropathy ([place])   . Dyspnea   . Dysrhythmia   . Headache   . Hyperlipidemia   . Hypertension   . Type 2 diabetes mellitus ([place])     Past Surgical History:  Procedure Laterality Date  . CORONARY ARTERY BYPASS GRAFT N/A 07/22/2017   Procedure: CORONARY ARTERY BYPASS GRAFTING (CABG) x four, using left internal mammary artery and right leg greater saphenous vein harvested endoscopically;  Surgeon: Melrose Nakayama, MD;  Location: [place];  Service: Open Heart Surgery;  Laterality: N/A;  . RIGHT/LEFT HEART CATH AND CORONARY ANGIOGRAPHY N/A 06/26/2017   Procedure: RIGHT/LEFT HEART CATH AND CORONARY ANGIOGRAPHY;  Surgeon: Leonie Man, MD;  Location: [place] CV LAB;  Service: Cardiovascular;  Laterality: N/A;  . TEE WITHOUT CARDIOVERSION N/A 06/25/2017   Procedure: TRANSESOPHAGEAL ECHOCARDIOGRAM (TEE);  Surgeon: Sanda Klein, MD;  Location: [place];  Service: Cardiovascular;  Laterality: N/A;  . TEE WITHOUT CARDIOVERSION N/A 07/22/2017   Procedure: TRANSESOPHAGEAL ECHOCARDIOGRAM (TEE);  Surgeon: Melrose Nakayama, MD;  Location: [place];  Service: Open Heart Surgery;  Laterality: N/A;  . TUBAL LIGATION      There were no vitals filed for this  visit.              Wound Therapy - 10/12/17 1020    Subjective  Pt has no complaints.  No pain today.    Patient and Family Stated Goals  wounds to heal     Date of Onset  04/13/17    Pain Assessment  No/denies pain    Evaluation and Treatment Procedures Explained to Patient/Family  Yes    Evaluation and Treatment Procedures  agreed to    Pressure Injury Properties Date First Assessed: 07/06/17 Time First Assessed: 1318 Location: Heel Location Orientation: Right Staging: - Present on Admission: Yes   Dressing Type  Adhesive bandage    Dressing  Changed    Dressing Change Frequency  PRN    State of Healing  Non-healing wound has undermining in all directions which was debrided      Site / Wound Assessment  Granulation tissue;Yellow    % Wound base Red or Granulating  90% after debridement     % Wound base Yellow/Fibrinous Exudate  10%    Peri-wound Assessment  Intact dry    Wound Length (cm)  0.9 cm    Wound Width (cm)  0.8 cm    Wound Depth (cm)  0.3 cm    Wound Surface Area (cm^2)  0.72 cm^2    Wound Volume (cm^3)  0.22 cm^3    Margins  -- heel tissue feels mushy  Drainage Amount  Scant    Drainage Description  Serous    Treatment  Cleansed;Debridement (Selective)    Selective Debridement - Location  wound bed has gotten deeper; therapist feels this was the "mushy" feeling  and now slough has all been removed from the wound bed. Now that this has occured wound bed should start rising.  If it does not therapist will send pt back to the MD.  Wound itself has decreased slightly in size.   all edges     Selective Debridement - Tools Used  Forceps;Scissors    Selective Debridement - Tissue Removed  devitalized tissue, slough , epiboled edges     Wound Therapy - Clinical Statement  Debrided all undermining as well as all slough from the bottom of the wound bed.  PT may decrease to one time a week at this time as wound will be slow healing and is 90% granulated.  PT is to  cleanse and change dressing every other day.  Therapist gave pt medihoney for self care at home.     Wound Therapy - Functional Problem List  difficulty walking     Factors Delaying/Impairing Wound Healing  Altered sensation;Diabetes Mellitus;Multiple medical problems;Polypharmacy;Vascular compromise    Hydrotherapy Plan  Debridement;Dressing change;Patient/family education    Wound Therapy - Frequency  Other (comment) 1  x a week for 6 weeks     Wound Therapy - Current Recommendations  PT    Wound Plan  decrease to one time a week.     Dressing   medihoney on 2X2, medipore tape                PT Short Term Goals - 10/12/17 1027      PT SHORT TERM GOAL #1   Title  Wounds to be 100% granulated to prevent infection     Time  2    Period  Weeks    Status  Partially Met      PT SHORT TERM GOAL #2   Title  PT to verbalize the importance of visually checking her feet everyday for any wound     Time  2    Period  Weeks    Status  Achieved      PT SHORT TERM GOAL #3   Title  wound to depth to have decreased to .1 cm to allow closure of woung.     Time  3    Period  Weeks    Status  On-going        PT Long Term Goals - 10/12/17 1027      PT LONG TERM GOAL #1   Title  Pt wound size to be no greater than .3x.3 x.1 to allow pt to feel comfortable with discharge from wound care.     Time  6    Period  Weeks    Status  On-going            Plan - 10/12/17 1026    Clinical Impression Statement  see above     Clinical Presentation  Stable    Rehab Potential  Good    PT Frequency  1x / week    PT Duration  6 weeks    PT Treatment/Interventions  Other (comment)    PT Next Visit Plan  debridement and dressing change:  measure wound each visit.     Consulted and Agree with Plan of Care  Patient       Patient will benefit from  skilled therapeutic intervention in order to improve the following deficits and impairments:  Other (comment)(nonhealing wound)  Visit  Diagnosis: Diabetic ulcer of right heel associated with type 2 diabetes mellitus, with necrosis of muscle ([place])     Problem List Patient Active Problem List   Diagnosis Date Noted  . 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 Dyspnea   . Coronary artery disease   . Severe mitral insufficiency   . Atrial flutter ([place])   . Mitral regurgitation 06/24/2017  . Cardiomyopathy ([place]) 06/20/2017  . Elevated troponin 06/19/2017  . CKD (chronic kidney disease) stage 3, GFR 30-59 ml/min (HCC) 06/19/2017  . Pressure injury of skin 06/19/2017  . Hyperkalemia 06/18/2017  . Type 2 diabetes mellitus, uncontrolled, with neuropathy ([place]) 06/18/2017  . Hyperlipidemia 06/18/2017  . Chest pain 06/18/2017  . Bronchopneumonia 03/06/2016  . CAP (community acquired pneumonia) 03/05/2016  . Insulin dependent diabetes mellitus ([place]) 03/05/2016  . Essential hypertension 03/05/2016  . Hyponatremia 03/05/2016  . Normocytic anemia 03/05/2016  . Hyperglycemia 03/05/2016  . Dehydration 03/05/2016  . Hypoxia 03/05/2016  . Leukocytosis 03/05/2016  . Acute respiratory failure with hypoxia [facility]) 03/05/2016   Rayetta Humphrey, PT CLT [phone] 10/12/2017, 10:28 AM  [place] [address] [place], [place], [address] Phone: [phone]   Fax:  [phone]  Name: Jill [name] MRN: [id number] Date of Birth: [birth year]

## 2017-10-15 ENCOUNTER — Ambulatory Visit (HOSPITAL_COMMUNITY): Payer: Self-pay | Admitting: Physical Therapy

## 2017-10-22 ENCOUNTER — Encounter (HOSPITAL_COMMUNITY): Payer: Self-pay | Admitting: Physical Therapy

## 2017-10-22 ENCOUNTER — Ambulatory Visit (HOSPITAL_COMMUNITY): Payer: Medicaid Other | Attending: *Deleted | Admitting: Physical Therapy

## 2017-10-22 DIAGNOSIS — L97513 Non-pressure chronic ulcer of other part of right foot with necrosis of muscle: Secondary | ICD-10-CM | POA: Diagnosis present

## 2017-10-22 DIAGNOSIS — E11621 Type 2 diabetes mellitus with foot ulcer: Secondary | ICD-10-CM | POA: Diagnosis not present

## 2017-10-22 DIAGNOSIS — L97413 Non-pressure chronic ulcer of right heel and midfoot with necrosis of muscle: Secondary | ICD-10-CM | POA: Insufficient documentation

## 2017-10-22 NOTE — Therapy (Signed)
Jill [name], [place], [address] Phone: [phone]   Fax:  [phone]  Wound Care Therapy  Patient Details  Name: Jill [name] MRN: [id number] Date of Birth: October 07, 1961 Referring Provider: Royce Macadamia    Encounter Date: 10/22/2017  PT End of Session - 10/22/17 1036    Visit Number  5    Number of Visits  6    Date for PT Re-Evaluation  11/01/17    Authorization Type  no insurance    PT Start Time  [phone]    PT Stop Time  1020    PT Time Calculation (min)  30 min    Activity Tolerance  Other (comment) debridement and dressing change        Past Medical History:  Diagnosis Date  . Diabetic neuropathy ([place])   . Dyspnea   . Dysrhythmia   . Headache   . Hyperlipidemia   . Hypertension   . Type 2 diabetes mellitus ([place])     Past Surgical History:  Procedure Laterality Date  . CORONARY ARTERY BYPASS GRAFT N/A 07/22/2017   Procedure: CORONARY ARTERY BYPASS GRAFTING (CABG) x four, using left internal mammary artery and right leg greater saphenous vein harvested endoscopically;  Surgeon: Melrose Nakayama, MD;  Location: [place];  Service: Open Heart Surgery;  Laterality: N/A;  . RIGHT/LEFT HEART CATH AND CORONARY ANGIOGRAPHY N/A 06/26/2017   Procedure: RIGHT/LEFT HEART CATH AND CORONARY ANGIOGRAPHY;  Surgeon: Leonie Man, MD;  Location: [place] CV LAB;  Service: Cardiovascular;  Laterality: N/A;  . TEE WITHOUT CARDIOVERSION N/A 06/25/2017   Procedure: TRANSESOPHAGEAL ECHOCARDIOGRAM (TEE);  Surgeon: Sanda Klein, MD;  Location: [place];  Service: Cardiovascular;  Laterality: N/A;  . TEE WITHOUT CARDIOVERSION N/A 07/22/2017   Procedure: TRANSESOPHAGEAL ECHOCARDIOGRAM (TEE);  Surgeon: Melrose Nakayama, MD;  Location: [place];  Service: Open Heart Surgery;  Laterality: N/A;  . TUBAL LIGATION      There were no vitals filed for this visit.              Wound Therapy - 10/22/17 1033    Subjective  Pt has no complaints.  No pain today.    Patient and Family Stated Goals  wounds to heal     Date of Onset  04/13/17    Pain Assessment  No/denies pain    Evaluation and Treatment Procedures Explained to Patient/Family  Yes    Evaluation and Treatment Procedures  agreed to    Pressure Injury Properties Date First Assessed: 07/06/17 Time First Assessed: 1318 Location: Heel Location Orientation: Right Staging: - Present on Admission: Yes   Dressing Type  Adhesive bandage    Dressing  Changed    Dressing Change Frequency  PRN    State of Healing  Non-healing wound has undermining in all directions which was debrided      Site / Wound Assessment  Granulation tissue;Yellow    % Wound base Red or Granulating  90% after debridement     % Wound base Yellow/Fibrinous Exudate  10%    Peri-wound Assessment  Intact dry    Wound Length (cm)  0.7 cm    Wound Width (cm)  0.6 cm    Wound Depth (cm)  0.2 cm    Wound Surface Area (cm^2)  0.42 cm^2    Wound Volume (cm^3)  0.08 cm^3    Margins  -- heel tissue feels mushy     Drainage Amount  Scant  Drainage Description  Serous    Treatment  Cleansed;Debridement (Selective)    Selective Debridement - Location  Pt wound has decreased in size.      Selective Debridement - Tools Used  Forceps;Scissors    Selective Debridement - Tissue Removed  callous , slough , epiboled edges     Wound Therapy - Clinical Statement  Pt wound continues to slowly fill in with decreased undermining.  Continues to be appropriate at one time a week.     Wound Therapy - Functional Problem List  difficulty walking     Factors Delaying/Impairing Wound Healing  Altered sensation;Diabetes Mellitus;Multiple medical problems;Polypharmacy;Vascular compromise    Hydrotherapy Plan  Debridement;Dressing change;Patient/family education    Wound Therapy - Frequency  Other (comment) 1  x a week for 6 weeks     Wound Therapy - Current Recommendations  PT    Wound Plan  continue  current treatment plan.  One time a week for 6 more weeks or until wound is healed.     Dressing   medihoney on 2X2, medipore tape                PT Short Term Goals - 10/12/17 1027      PT SHORT TERM GOAL #1   Title  Wounds to be 100% granulated to prevent infection     Time  2    Period  Weeks    Status  Partially Met      PT SHORT TERM GOAL #2   Title  PT to verbalize the importance of visually checking her feet everyday for any wound     Time  2    Period  Weeks    Status  Achieved      PT SHORT TERM GOAL #3   Title  wound to depth to have decreased to .1 cm to allow closure of woung.     Time  3    Period  Weeks    Status  On-going        PT Long Term Goals - 10/12/17 1027      PT LONG TERM GOAL #1   Title  Pt wound size to be no greater than .3x.3 x.1 to allow pt to feel comfortable with discharge from wound care.     Time  6    Period  Weeks    Status  On-going            Plan - 10/22/17 1037    Clinical Impression Statement  as above     Rehab Potential  Good    PT Frequency  1x / week    PT Duration  6 weeks    PT Treatment/Interventions  Other (comment)    PT Next Visit Plan  debridement and dressing change:  measure wound each visit.     Consulted and Agree with Plan of Care  Patient       Patient will benefit from skilled therapeutic intervention in order to improve the following deficits and impairments:  Other (comment)(nonhealing wound)  Visit Diagnosis: Diabetic ulcer of right heel associated with type 2 diabetes mellitus, with necrosis of muscle ([place])     Problem List Patient Active Problem List   Diagnosis Date Noted  . 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 Dyspnea   . Coronary artery disease   . Severe mitral insufficiency   . Atrial flutter ([place])   . Mitral regurgitation 06/24/2017  . Cardiomyopathy ([place]) 06/20/2017  . Elevated  troponin 06/19/2017  . CKD (chronic kidney disease) stage 3, GFR 30-59 ml/min (HCC) 06/19/2017  .  Pressure injury of skin 06/19/2017  . Hyperkalemia 06/18/2017  . Type 2 diabetes mellitus, uncontrolled, with neuropathy ([place]) 06/18/2017  . Hyperlipidemia 06/18/2017  . Chest pain 06/18/2017  . Bronchopneumonia 03/06/2016  . CAP (community acquired pneumonia) 03/05/2016  . Insulin dependent diabetes mellitus ([place]) 03/05/2016  . Essential hypertension 03/05/2016  . Hyponatremia 03/05/2016  . Normocytic anemia 03/05/2016  . Hyperglycemia 03/05/2016  . Dehydration 03/05/2016  . Hypoxia 03/05/2016  . Leukocytosis 03/05/2016  . Acute respiratory failure with hypoxia [facility]) 03/05/2016   Rayetta Humphrey, PT CLT [phone] 10/22/2017, 10:38 AM  [facility] [address] [place], [place], [address] Phone: [phone]   Fax:  [phone]  Name: Alanys Godino MRN: [id number] Date of Birth: [birth year]

## 2017-10-29 ENCOUNTER — Ambulatory Visit (HOSPITAL_COMMUNITY): Payer: Medicaid Other | Admitting: Physical Therapy

## 2017-10-29 DIAGNOSIS — L97513 Non-pressure chronic ulcer of other part of right foot with necrosis of muscle: Secondary | ICD-10-CM

## 2017-10-29 DIAGNOSIS — E11621 Type 2 diabetes mellitus with foot ulcer: Secondary | ICD-10-CM | POA: Diagnosis not present

## 2017-10-29 DIAGNOSIS — L97413 Non-pressure chronic ulcer of right heel and midfoot with necrosis of muscle: Principal | ICD-10-CM

## 2017-10-29 NOTE — Therapy (Signed)
[place], [place], [address] Phone: [phone]   Fax:  [phone]  Wound Care Therapy  Patient Details  Name: Jill [name] MRN: [id number] Date of Birth: 07-13-1961 Referring Provider: Royce Macadamia    Encounter Date: 10/29/2017  PT End of Session - 10/29/17 1007    Visit Number  6    Number of Visits  12    Date for PT Re-Evaluation  11/06/17    Authorization Type  no insurance; cert 02/72-5/36/64    PT Start Time  0910    PT Stop Time  0930    PT Time Calculation (min)  20 min    Activity Tolerance  Other (comment) debridement and dressing change        Past Medical History:  Diagnosis Date  . Diabetic neuropathy ([place])   . Dyspnea   . Dysrhythmia   . Headache   . Hyperlipidemia   . Hypertension   . Type 2 diabetes mellitus ([place])     Past Surgical History:  Procedure Laterality Date  . CORONARY ARTERY BYPASS GRAFT N/A 07/22/2017   Procedure: CORONARY ARTERY BYPASS GRAFTING (CABG) x four, using left internal mammary artery and right leg greater saphenous vein harvested endoscopically;  Surgeon: Melrose Nakayama, MD;  Location: [place];  Service: Open Heart Surgery;  Laterality: N/A;  . RIGHT/LEFT HEART CATH AND CORONARY ANGIOGRAPHY N/A 06/26/2017   Procedure: RIGHT/LEFT HEART CATH AND CORONARY ANGIOGRAPHY;  Surgeon: Leonie Man, MD;  Location: [place] [facility];  Service: Cardiovascular;  Laterality: N/A;  . TEE WITHOUT CARDIOVERSION N/A 06/25/2017   Procedure: TRANSESOPHAGEAL ECHOCARDIOGRAM (TEE);  Surgeon: Sanda Klein, MD;  Location: [place] [facility];  Service: Cardiovascular;  Laterality: N/A;  . TEE WITHOUT CARDIOVERSION N/A 07/22/2017   Procedure: TRANSESOPHAGEAL ECHOCARDIOGRAM (TEE);  Surgeon: Melrose Nakayama, MD;  Location: [place];  Service: Open Heart Surgery;  Laterality: N/A;  . TUBAL LIGATION      There were no vitals filed for this visit.              Wound  Therapy - 10/29/17 0955    Subjective  Pt has no complaints.  No pain today.    Patient and Family Stated Goals  wounds to heal     Date of Onset  04/13/17    Pain Assessment  No/denies pain    Evaluation and Treatment Procedures Explained to Patient/Family  Yes    Evaluation and Treatment Procedures  agreed to    Pressure Injury Properties Date First Assessed: 07/06/17 Time First Assessed: 1318 Location: Heel Location Orientation: Right Staging: - Present on Admission: Yes   Dressing Type  Gauze (Comment);Tape dressing    Dressing  Changed    Dressing Change Frequency  PRN    State of Healing  Non-healing    Site / Wound Assessment  Granulation tissue;Yellow    % Wound base Red or Granulating  95%    % Wound base Yellow/Fibrinous Exudate  5%    Peri-wound Assessment  Intact    Wound Length (cm)  0.5 cm    Wound Width (cm)  0.5 cm    Wound Depth (cm)  0.2 cm    Wound Surface Area (cm^2)  0.25 cm^2    Wound Volume (cm^3)  0.05 cm^3    Drainage Amount  Scant    Drainage Description  Serous    Treatment  Cleansed;Debridement (Selective)    Selective Debridement - Location  Rt heel  Selective Debridement - Tools Used  Scalpel;Forceps;Scissors    Selective Debridement - Tissue Removed  callous , slough , epiboled edges     Wound Therapy - Clinical Statement  Wound with epibole border that was completely debrided as well as slough in woundbed that was removed completly.  Wound continues to decrease in size and approximate.  Pt remains compliant with using medipore tape/gauze versus bandaids.  No other issues voiced by patient.     Hydrotherapy Plan  Debridement;Dressing change;Patient/family education    Wound Therapy - Frequency  Other (comment) 1 X weekly    Wound Plan  continue current treatment plan.  One time a week with cert date ending on 8/85.    Dressing   medihoney on 2X2, medipore tape                PT Short Term Goals - 10/12/17 1027      PT SHORT TERM GOAL #1    Title  Wounds to be 100% granulated to prevent infection     Time  2    Period  Weeks    Status  Partially Met      PT SHORT TERM GOAL #2   Title  PT to verbalize the importance of visually checking her feet everyday for any wound     Time  2    Period  Weeks    Status  Achieved      PT SHORT TERM GOAL #3   Title  wound to depth to have decreased to .1 cm to allow closure of woung.     Time  3    Period  Weeks    Status  On-going        PT Long Term Goals - 10/12/17 1027      PT LONG TERM GOAL #1   Title  Pt wound size to be no greater than .3x.3 x.1 to allow pt to feel comfortable with discharge from wound care.     Time  6    Period  Weeks    Status  On-going              Patient will benefit from skilled therapeutic intervention in order to improve the following deficits and impairments:     Visit Diagnosis: Diabetic ulcer of right heel associated with type 2 diabetes mellitus, with necrosis of muscle ([place])  Diabetic ulcer of other part of right foot associated with type 2 diabetes mellitus, with necrosis of muscle ([place])     Problem List Patient Active Problem List   Diagnosis Date Noted  . 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 Dyspnea   . Coronary artery disease   . Severe mitral insufficiency   . Atrial flutter ([place])   . Mitral regurgitation 06/24/2017  . Cardiomyopathy ([place]) 06/20/2017  . Elevated troponin 06/19/2017  . CKD (chronic kidney disease) stage 3, GFR 30-59 ml/min (HCC) 06/19/2017  . Pressure injury of skin 06/19/2017  . Hyperkalemia 06/18/2017  . Type 2 diabetes mellitus, uncontrolled, with neuropathy ([place]) 06/18/2017  . Hyperlipidemia 06/18/2017  . Chest pain 06/18/2017  . Bronchopneumonia 03/06/2016  . CAP (community acquired pneumonia) 03/05/2016  . Insulin dependent diabetes mellitus ([place]) 03/05/2016  . Essential hypertension 03/05/2016  . Hyponatremia 03/05/2016  . Normocytic anemia 03/05/2016  . Hyperglycemia 03/05/2016  .  Dehydration 03/05/2016  . Hypoxia 03/05/2016  . Leukocytosis 03/05/2016  . Acute respiratory failure with hypoxia ([place]) 03/05/2016   Teena Irani, PTA/CLT [phone]  Mare Ferrari,  Amy B 10/29/2017, 10:10 AM  [place], [place], [address] Phone: [phone]   Fax:  [phone]  Name: Vernella Niznik MRN: [id number] Date of Birth: 1960-10-29

## 2017-11-04 ENCOUNTER — Telehealth (HOSPITAL_COMMUNITY): Payer: Self-pay | Admitting: Physical Therapy

## 2017-11-04 NOTE — Telephone Encounter (Signed)
Patient can't come tomorrow she is out of town

## 2017-11-05 ENCOUNTER — Ambulatory Visit (HOSPITAL_COMMUNITY): Payer: Medicaid Other | Admitting: Physical Therapy

## 2017-11-12 ENCOUNTER — Ambulatory Visit (HOSPITAL_COMMUNITY): Payer: Medicaid Other | Admitting: Physical Therapy

## 2017-11-12 DIAGNOSIS — L97413 Non-pressure chronic ulcer of right heel and midfoot with necrosis of muscle: Principal | ICD-10-CM

## 2017-11-12 DIAGNOSIS — E11621 Type 2 diabetes mellitus with foot ulcer: Secondary | ICD-10-CM

## 2017-11-12 DIAGNOSIS — L97513 Non-pressure chronic ulcer of other part of right foot with necrosis of muscle: Secondary | ICD-10-CM

## 2017-11-12 NOTE — Therapy (Signed)
[place] [facility] [address] [place], [place], [address] Phone: [phone]   Fax:  [phone]  Wound Care Therapy  Patient Details  Name: Jill [name] MRN: [id number] Date of Birth: 06-Nov-1960 Referring Provider: Kizzie Furnish    Encounter Date: 11/12/2017  PT End of Session - 11/12/17 0853    Visit Number  7    Number of Visits  12    Date for PT Re-Evaluation  11/06/17    Authorization Type  no insurance; cert  6/96/2952-8/41/3244   PT Start Time  0818    PT Stop Time  0845    PT Time Calculation (min)  27 min    Activity Tolerance  Other (comment) debridement and dressing change        Past Medical History:  Diagnosis Date  . Diabetic neuropathy ([facility])   . Dyspnea   . Dysrhythmia   . Headache   . Hyperlipidemia   . Hypertension   . Type 2 diabetes mellitus ([facility])     Past Surgical History:  Procedure Laterality Date  . CORONARY ARTERY BYPASS GRAFT N/A 07/22/2017   Procedure: CORONARY ARTERY BYPASS GRAFTING (CABG) x four, using left internal mammary artery and right leg greater saphenous vein harvested endoscopically;  Surgeon: Loreli Slot, MD;  Location: [facility] OR;  Service: Open Heart Surgery;  Laterality: N/A;  . RIGHT/LEFT HEART CATH AND CORONARY ANGIOGRAPHY N/A 06/26/2017   Procedure: RIGHT/LEFT HEART CATH AND CORONARY ANGIOGRAPHY;  Surgeon: Marykay Lex, MD;  Location: [facility] INVASIVE CV LAB;  Service: Cardiovascular;  Laterality: N/A;  . TEE WITHOUT CARDIOVERSION N/A 06/25/2017   Procedure: TRANSESOPHAGEAL ECHOCARDIOGRAM (TEE);  Surgeon: Thurmon Fair, MD;  Location: [facility] ENDOSCOPY;  Service: Cardiovascular;  Laterality: N/A;  . TEE WITHOUT CARDIOVERSION N/A 07/22/2017   Procedure: TRANSESOPHAGEAL ECHOCARDIOGRAM (TEE);  Surgeon: Loreli Slot, MD;  Location: [facility] OR;  Service: Open Heart Surgery;  Laterality: N/A;  . TUBAL LIGATION      There were no vitals filed for this visit.              Wound  Therapy - 11/12/17 0848    Subjective  Pt returns today after 1/10 apt (2 week absence) due to being out of town.  Pt reports no pain or issues.      Patient and Family Stated Goals  wounds to heal     Date of Onset  04/13/17    Pain Assessment  No/denies pain    Evaluation and Treatment Procedures Explained to Patient/Family  Yes    Evaluation and Treatment Procedures  agreed to    Pressure Injury Properties Date First Assessed: 07/06/17 Time First Assessed: 1318 Location: Heel Location Orientation: Right Staging: - Present on Admission: Yes   Dressing Type  Gauze (Comment);Tape dressing    Dressing  Changed    Dressing Change Frequency  PRN    State of Healing  Non-healing    Site / Wound Assessment  Granulation tissue;Yellow    % Wound base Red or Granulating  95%    % Wound base Yellow/Fibrinous Exudate  5%    Peri-wound Assessment  Intact    Wound Length (cm)  0.5 cm    Wound Width (cm)  0.5 cm    Wound Depth (cm)  0.1 cm    Wound Surface Area (cm^2)  0.25 cm^2    Wound Volume (cm^3)  0.02 cm^3    Drainage Amount  Scant    Drainage Description  Serous  Treatment  Cleansed;Debridement (Selective)    Selective Debridement - Location  Rt heel    Selective Debridement - Tools Used  Scalpel;Forceps;Scissors    Selective Debridement - Tissue Removed  callous , slough , epiboled edges     Wound Therapy - Clinical Statement  Epibole along inferior border of wound.  Able to debride all alway to promote approximation.  wound borders left with good attachment.  Remeasured with no change in length/width but improving depth.  Changed dressing to xeroform to promote increased approximation.  Pt educated with how to dress using xeroform and provided dressing for this.  Secured with 2X2 and medipore tape.    Hydrotherapy Plan  Debridement;Dressing change;Patient/family education    Wound Therapy - Frequency  Other (comment) 1 X weekly    Wound Plan  continue current treatment plan.  One time a  week until fully closed.    Dressing   xeroform, 2X2,  medipore tape                PT Short Term Goals - 11/12/17 0853      PT SHORT TERM GOAL #1   Title  Wounds to be 100% granulated to prevent infection     Time  2    Period  Weeks    Status  On-going      PT SHORT TERM GOAL #2   Title  PT to verbalize the importance of visually checking her feet everyday for any wound     Time  2    Period  Weeks    Status  Achieved      PT SHORT TERM GOAL #3   Title  wound to depth to have decreased to .1 cm to allow closure of wound.     Time  3    Period  Weeks    Status  Achieved        PT Long Term Goals - 11/12/17 1583      PT LONG TERM GOAL #1   Title  Pt wound size to be no greater than .3x.3 x.1 to allow pt to feel comfortable with discharge from wound care.     Baseline  1/24 0.5X0.5X0.1cm    Time  6    Period  Weeks    Status  On-going              Patient will benefit from skilled therapeutic intervention in order to improve the following deficits and impairments:     Visit Diagnosis: Diabetic ulcer of right heel associated with type 2 diabetes mellitus, with necrosis of muscle (HCC)  Diabetic ulcer of other part of right foot associated with type 2 diabetes mellitus, with necrosis of muscle (HCC)     Problem List Patient Active Problem List   Diagnosis Date Noted  . 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 Dyspnea   . Coronary artery disease   . Severe mitral insufficiency   . Atrial flutter (HCC)   . Mitral regurgitation 06/24/2017  . Cardiomyopathy ([facility]) 06/20/2017  . Elevated troponin 06/19/2017  . CKD (chronic kidney disease) stage 3, GFR 30-59 ml/min (HCC) 06/19/2017  . Pressure injury of skin 06/19/2017  . Hyperkalemia 06/18/2017  . Type 2 diabetes mellitus, uncontrolled, with neuropathy (HCC) 06/18/2017  . Hyperlipidemia 06/18/2017  . Chest pain 06/18/2017  . Bronchopneumonia 03/06/2016  . CAP (community acquired pneumonia) 03/05/2016  .  Insulin dependent diabetes mellitus (HCC) 03/05/2016  . Essential hypertension 03/05/2016  . Hyponatremia 03/05/2016  .  Normocytic anemia 03/05/2016  . Hyperglycemia 03/05/2016  . Dehydration 03/05/2016  . Hypoxia 03/05/2016  . Leukocytosis 03/05/2016  . Acute respiratory failure with hypoxia (HCC) 03/05/2016   Lurena Nida, PTA/CLT [phone]  Virgina Organ, PT CLT [phone] 11/12/2017, 8:55 AM  [place] [facility] [address] [place], [place], [address] Phone: [phone]   Fax:  [phone]  Name: Jill [name] MRN: [id number] Date of Birth: [birth year]

## 2017-11-19 ENCOUNTER — Ambulatory Visit (HOSPITAL_COMMUNITY): Payer: Medicaid Other | Admitting: Physical Therapy

## 2017-11-19 ENCOUNTER — Telehealth: Payer: Self-pay | Admitting: Cardiology

## 2017-11-19 DIAGNOSIS — E11621 Type 2 diabetes mellitus with foot ulcer: Secondary | ICD-10-CM

## 2017-11-19 DIAGNOSIS — L97413 Non-pressure chronic ulcer of right heel and midfoot with necrosis of muscle: Principal | ICD-10-CM

## 2017-11-19 NOTE — Telephone Encounter (Signed)
Pt came into office concerning her meds, was told by Kizzie Furnish PA at [place] to ask why Lisinopril and Janumet were discontinued from her medical regimen. Would like for the nurse to please contact [facility]  @ [phone]

## 2017-11-19 NOTE — Telephone Encounter (Signed)
Spoke with triage nurse from [facility]. Let her know that our office did not DC pt's medication, but her PCP or a hospital doctor could have.

## 2017-11-19 NOTE — Therapy (Signed)
[place] [facility] [address] [place], [place], [address] Phone: [phone]   Fax:  [phone]  Wound Care Therapy  Patient Details  Name: Jill [name] MRN: [id number] Date of Birth: 1960/10/30 Referring Provider: Kizzie Furnish    Encounter Date: 11/19/2017  PT End of Session - 11/19/17 0904    Visit Number  8    Number of Visits  12    Date for PT Re-Evaluation  11/06/17    Authorization Type  no insurance; cert 02/40-9/73/53    PT Start Time  0818    PT Stop Time  0835    PT Time Calculation (min)  17 min    Activity Tolerance  Other (comment) debridement and dressing change        Past Medical History:  Diagnosis Date  . Diabetic neuropathy (H[facility])   . Dyspnea   . Dysrhythmia   . Headache   . Hyperlipidemia   . Hypertension   . Type 2 diabetes mellitus ([facility])     Past Surgical History:  Procedure Laterality Date  . CORONARY ARTERY BYPASS GRAFT N/A 07/22/2017   Procedure: CORONARY ARTERY BYPASS GRAFTING (CABG) x four, using left internal mammary artery and right leg greater saphenous vein harvested endoscopically;  Surgeon: Loreli Slot, MD;  Location: [facility] OR;  Service: Open Heart Surgery;  Laterality: N/A;  . RIGHT/LEFT HEART CATH AND CORONARY ANGIOGRAPHY N/A 06/26/2017   Procedure: RIGHT/LEFT HEART CATH AND CORONARY ANGIOGRAPHY;  Surgeon: Marykay Lex, MD;  Location: [facility] INVASIVE CV LAB;  Service: Cardiovascular;  Laterality: N/A;  . TEE WITHOUT CARDIOVERSION N/A 06/25/2017   Procedure: TRANSESOPHAGEAL ECHOCARDIOGRAM (TEE);  Surgeon: Thurmon Fair, MD;  Location: [facility] ENDOSCOPY;  Service: Cardiovascular;  Laterality: N/A;  . TEE WITHOUT CARDIOVERSION N/A 07/22/2017   Procedure: TRANSESOPHAGEAL ECHOCARDIOGRAM (TEE);  Surgeon: Loreli Slot, MD;  Location: [facility] OR;  Service: Open Heart Surgery;  Laterality: N/A;  . TUBAL LIGATION      There were no vitals filed for this visit.              Wound  Therapy - 11/19/17 0901    Subjective  Pt reports she is doing well today.  States she has been taking care of her foot and using the yellow dressing on it.    Patient and Family Stated Goals  wounds to heal     Date of Onset  04/13/17    Pain Assessment  No/denies pain    Evaluation and Treatment Procedures Explained to Patient/Family  Yes    Evaluation and Treatment Procedures  agreed to    Pressure Injury Properties Date First Assessed: 07/06/17 Time First Assessed: 1318 Location: Heel Location Orientation: Right Staging: - Present on Admission: Yes   Dressing Type  Gauze (Comment);Tape dressing    Dressing  Changed    Dressing Change Frequency  PRN    State of Healing  Non-healing    Site / Wound Assessment  Granulation tissue;Yellow    % Wound base Red or Granulating  95%    % Wound base Yellow/Fibrinous Exudate  5%    Peri-wound Assessment  Intact    Wound Length (cm)  0.3 cm    Wound Width (cm)  0.3 cm    Wound Depth (cm)  0.1 cm    Wound Surface Area (cm^2)  0.09 cm^2    Wound Volume (cm^3)  0.01 cm^3    Drainage Amount  Scant    Drainage Description  Serous  Treatment  Cleansed;Debridement (Selective)    Selective Debridement - Location  Rt heel    Selective Debridement - Tools Used  Forceps;Scissors    Selective Debridement - Tissue Removed  callous , slough , epiboled edges     Wound Therapy - Clinical Statement  Border on edges removed to promote approximation.  Overall decreasing in size and without depth. Instructed to continue current POC with anticipation of complete healing in the next 2 weeks.    Hydrotherapy Plan  Debridement;Dressing change;Patient/family education    Wound Therapy - Frequency  Other (comment) 1 X weekly    Wound Plan  continue current treatment plan.  One time a week until fully closed.    Dressing   xeroform, 2X2,  medipore tape                PT Short Term Goals - 11/12/17 0853      PT SHORT TERM GOAL #1   Title  Wounds to be  100% granulated to prevent infection     Time  2    Period  Weeks    Status  On-going      PT SHORT TERM GOAL #2   Title  PT to verbalize the importance of visually checking her feet everyday for any wound     Time  2    Period  Weeks    Status  Achieved      PT SHORT TERM GOAL #3   Title  wound to depth to have decreased to .1 cm to allow closure of wound.     Time  3    Period  Weeks    Status  Achieved        PT Long Term Goals - 11/12/17 6962      PT LONG TERM GOAL #1   Title  Pt wound size to be no greater than .3x.3 x.1 to allow pt to feel comfortable with discharge from wound care.     Baseline  1/24 0.5X0.5X0.1cm    Time  6    Period  Weeks    Status  On-going              Patient will benefit from skilled therapeutic intervention in order to improve the following deficits and impairments:     Visit Diagnosis: Diabetic ulcer of right heel associated with type 2 diabetes mellitus, with necrosis of muscle (HCC)     Problem List Patient Active Problem List   Diagnosis Date Noted  . 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 Dyspnea   . Coronary artery disease   . Severe mitral insufficiency   . Atrial flutter (HCC)   . Mitral regurgitation 06/24/2017  . Cardiomyopathy (HCC) 06/20/2017  . Elevated troponin 06/19/2017  . CKD (chronic kidney disease) stage 3, GFR 30-59 ml/min (HCC) 06/19/2017  . Pressure injury of skin 06/19/2017  . Hyperkalemia 06/18/2017  . Type 2 diabetes mellitus, uncontrolled, with neuropathy (H[facility]) 06/18/2017  . Hyperlipidemia 06/18/2017  . Chest pain 06/18/2017  . Bronchopneumonia 03/06/2016  . CAP (community acquired pneumonia) 03/05/2016  . Insulin dependent diabetes mellitus (HCC) 03/05/2016  . Essential hypertension 03/05/2016  . Hyponatremia 03/05/2016  . Normocytic anemia 03/05/2016  . Hyperglycemia 03/05/2016  . Dehydration 03/05/2016  . Hypoxia 03/05/2016  . Leukocytosis 03/05/2016  . Acute respiratory failure with hypoxia  (HCC) 03/05/2016   Lurena Nida, PTA/CLT [phone]  Emeline Gins B 11/19/2017, 9:04 AM  [place] [facility] [address] [place], [place], [address] Phone: [phone]   Fax:  [phone]  Name: Jill [name] MRN: [id number] Date of Birth: 1960/10/21

## 2017-11-19 NOTE — Telephone Encounter (Signed)
Returned call to patient & [facility]. Left messages for both to return call.

## 2017-11-26 ENCOUNTER — Ambulatory Visit (HOSPITAL_COMMUNITY): Payer: Medicaid Other | Attending: *Deleted | Admitting: Physical Therapy

## 2017-11-26 DIAGNOSIS — E11621 Type 2 diabetes mellitus with foot ulcer: Secondary | ICD-10-CM | POA: Insufficient documentation

## 2017-11-26 DIAGNOSIS — L97413 Non-pressure chronic ulcer of right heel and midfoot with necrosis of muscle: Secondary | ICD-10-CM | POA: Diagnosis present

## 2017-11-26 DIAGNOSIS — L97513 Non-pressure chronic ulcer of other part of right foot with necrosis of muscle: Secondary | ICD-10-CM | POA: Diagnosis present

## 2017-11-26 NOTE — Therapy (Signed)
[facility] [address] [place], [place], [address] Phone: [phone]   Fax:  [phone]  Wound Care Therapy  Patient Details  Name: Jill [name] MRN: [id number] Date of Birth: 1960/12/09 Referring Provider: Kizzie Furnish    Encounter Date: 11/26/2017  PT End of Session - 11/26/17 0850    Visit Number  9    Number of Visits  12    Date for PT Re-Evaluation  11/06/17    Authorization Type  no insurance; cert 6/96-2/95/28    PT Start Time  0820    PT Stop Time  0845    PT Time Calculation (min)  25 min    Activity Tolerance  Other (comment) debridement and dressing change        Past Medical History:  Diagnosis Date  . Diabetic neuropathy (HCC)   . Dyspnea   . Dysrhythmia   . Headache   . Hyperlipidemia   . Hypertension   . Type 2 diabetes mellitus (HCC)     Past Surgical History:  Procedure Laterality Date  . CORONARY ARTERY BYPASS GRAFT N/A 07/22/2017   Procedure: CORONARY ARTERY BYPASS GRAFTING (CABG) x four, using left internal mammary artery and right leg greater saphenous vein harvested endoscopically;  Surgeon: Loreli Slot, MD;  Location: [facility] OR;  Service: Open Heart Surgery;  Laterality: N/A;  . RIGHT/LEFT HEART CATH AND CORONARY ANGIOGRAPHY N/A 06/26/2017   Procedure: RIGHT/LEFT HEART CATH AND CORONARY ANGIOGRAPHY;  Surgeon: Marykay Lex, MD;  Location: [facility] INVASIVE CV LAB;  Service: Cardiovascular;  Laterality: N/A;  . TEE WITHOUT CARDIOVERSION N/A 06/25/2017   Procedure: TRANSESOPHAGEAL ECHOCARDIOGRAM (TEE);  Surgeon: Thurmon Fair, MD;  Location: [facility] ENDOSCOPY;  Service: Cardiovascular;  Laterality: N/A;  . TEE WITHOUT CARDIOVERSION N/A 07/22/2017   Procedure: TRANSESOPHAGEAL ECHOCARDIOGRAM (TEE);  Surgeon: Loreli Slot, MD;  Location: [facility] OR;  Service: Open Heart Surgery;  Laterality: N/A;  . TUBAL LIGATION      There were no vitals filed for this visit.              Wound Therapy  - 11/26/17 0845    Subjective  Pt returns today with interpreter present.  Informed interpreter she was not needed, pateint agreed.   No pain or issues.  Dressing was off wound, however reports she just took it off.     Patient and Family Stated Goals  wounds to heal     Date of Onset  04/13/17    Pain Assessment  No/denies pain    Evaluation and Treatment Procedures Explained to Patient/Family  Yes    Evaluation and Treatment Procedures  agreed to    Pressure Injury Properties Date First Assessed: 07/06/17 Time First Assessed: 1318 Location: Heel Location Orientation: Right Staging: - Present on Admission: Yes   Dressing Type  Gauze (Comment);Tape dressing    Dressing  Changed    Dressing Change Frequency  PRN    State of Healing  Non-healing    Site / Wound Assessment  Granulation tissue;Yellow    % Wound base Red or Granulating  100%    % Wound base Yellow/Fibrinous Exudate  0%    Peri-wound Assessment  Intact    Wound Length (cm)  0.2 cm    Wound Width (cm)  0.2 cm    Wound Depth (cm)  0 cm    Wound Surface Area (cm^2)  0.04 cm^2    Wound Volume (cm^3)  0 cm^3    Drainage Amount  None    Treatment  Cleansed;Debridement (Selective)    Selective Debridement - Location  Rt heel    Selective Debridement - Tools Used  Forceps;Scissors    Selective Debridement - Tissue Removed  callous edges, epiboled edges     Wound Therapy - Clinical Statement  wound appeared to be healed, however revealed callous overgrowth around border that needed to be debrided.  Wound is 100% granulated with no depth.  Contiued with xeroform/medipore and instructed not to remove bandage.  pt verbalized understanding.  Anticipate full healing by next week.     Hydrotherapy Plan  Debridement;Dressing change;Patient/family education    Wound Therapy - Frequency  Other (comment) 1 X weekly    Wound Plan  continue current treatment plan.  One time a week until fully closed.    Dressing   xeroform, 2X2,  medipore tape                 PT Short Term Goals - 11/12/17 0853      PT SHORT TERM GOAL #1   Title  Wounds to be 100% granulated to prevent infection     Time  2    Period  Weeks    Status  On-going      PT SHORT TERM GOAL #2   Title  PT to verbalize the importance of visually checking her feet everyday for any wound     Time  2    Period  Weeks    Status  Achieved      PT SHORT TERM GOAL #3   Title  wound to depth to have decreased to .1 cm to allow closure of wound.     Time  3    Period  Weeks    Status  Achieved        PT Long Term Goals - 11/12/17 5784      PT LONG TERM GOAL #1   Title  Pt wound size to be no greater than .3x.3 x.1 to allow pt to feel comfortable with discharge from wound care.     Baseline  1/24 0.5X0.5X0.1cm    Time  6    Period  Weeks    Status  On-going              Patient will benefit from skilled therapeutic intervention in order to improve the following deficits and impairments:     Visit Diagnosis: Diabetic ulcer of right heel associated with type 2 diabetes mellitus, with necrosis of muscle (HCC)  Diabetic ulcer of other part of right foot associated with type 2 diabetes mellitus, with necrosis of muscle (HCC)     Problem List Patient Active Problem List   Diagnosis Date Noted  . 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 Dyspnea   . Coronary artery disease   . Severe mitral insufficiency   . Atrial flutter (HCC)   . Mitral regurgitation 06/24/2017  . Cardiomyopathy (HCC) 06/20/2017  . Elevated troponin 06/19/2017  . CKD (chronic kidney disease) stage 3, GFR 30-59 ml/min (HCC) 06/19/2017  . Pressure injury of skin 06/19/2017  . Hyperkalemia 06/18/2017  . Type 2 diabetes mellitus, uncontrolled, with neuropathy (HCC) 06/18/2017  . Hyperlipidemia 06/18/2017  . Chest pain 06/18/2017  . Bronchopneumonia 03/06/2016  . CAP (community acquired pneumonia) 03/05/2016  . Insulin dependent diabetes mellitus (HCC) 03/05/2016  . Essential  hypertension 03/05/2016  . Hyponatremia 03/05/2016  . Normocytic anemia 03/05/2016  . Hyperglycemia 03/05/2016  . Dehydration 03/05/2016  . Hypoxia  03/05/2016  . Leukocytosis 03/05/2016  . Acute respiratory failure with hypoxia (HCC) 03/05/2016   Lurena Nida, PTA/CLT [phone]  Lurena Nida 11/26/2017, 8:51 AM  [facility] [address] [place], [place], [address] Phone: [phone]   Fax:  [phone]  Name: Jill [name] MRN: [id number] Date of Birth: Jul 17, 1961

## 2017-12-03 ENCOUNTER — Ambulatory Visit (HOSPITAL_COMMUNITY): Payer: Medicaid Other | Admitting: Physical Therapy

## 2017-12-03 DIAGNOSIS — E11621 Type 2 diabetes mellitus with foot ulcer: Secondary | ICD-10-CM | POA: Diagnosis not present

## 2017-12-03 DIAGNOSIS — L97413 Non-pressure chronic ulcer of right heel and midfoot with necrosis of muscle: Principal | ICD-10-CM

## 2017-12-03 NOTE — Therapy (Signed)
[place], [place], [address] Phone: [phone]   Fax:  [phone]  Wound Care Therapy  Patient Details  Name: Jill [name] MRN: [id number] Date of Birth: 04/20/1961 Referring Provider: Royce Macadamia    Encounter Date: 12/03/2017  PT End of Session - 12/03/17 0914    Visit Number  10    Number of Visits  10    Date for PT Re-Evaluation  11/06/17    Authorization Type  no insurance; cert 8/93-7/34/28    PT Start Time  0820    PT Stop Time  0840    PT Time Calculation (min)  20 min    Activity Tolerance  Other (comment) debridement and dressing change        Past Medical History:  Diagnosis Date  . Diabetic neuropathy ([place])   . Dyspnea   . Dysrhythmia   . Headache   . Hyperlipidemia   . Hypertension   . Type 2 diabetes mellitus ([place])     Past Surgical History:  Procedure Laterality Date  . CORONARY ARTERY BYPASS GRAFT N/A 07/22/2017   Procedure: CORONARY ARTERY BYPASS GRAFTING (CABG) x four, using left internal mammary artery and right leg greater saphenous vein harvested endoscopically;  Surgeon: Melrose Nakayama, MD;  Location: [place];  Service: Open Heart Surgery;  Laterality: N/A;  . RIGHT/LEFT HEART CATH AND CORONARY ANGIOGRAPHY N/A 06/26/2017   Procedure: RIGHT/LEFT HEART CATH AND CORONARY ANGIOGRAPHY;  Surgeon: Leonie Man, MD;  Location: [place] CV LAB;  Service: Cardiovascular;  Laterality: N/A;  . TEE WITHOUT CARDIOVERSION N/A 06/25/2017   Procedure: TRANSESOPHAGEAL ECHOCARDIOGRAM (TEE);  Surgeon: Sanda Klein, MD;  Location: [place];  Service: Cardiovascular;  Laterality: N/A;  . TEE WITHOUT CARDIOVERSION N/A 07/22/2017   Procedure: TRANSESOPHAGEAL ECHOCARDIOGRAM (TEE);  Surgeon: Melrose Nakayama, MD;  Location: [place];  Service: Open Heart Surgery;  Laterality: N/A;  . TUBAL LIGATION      There were no vitals filed for this visit.              Wound  Therapy - 12/03/17 0904    Subjective  Pt returns today without bandage, stating she just removed it.    Patient and Family Stated Goals  wounds to heal     Date of Onset  04/13/17    Pain Assessment  No/denies pain    Evaluation and Treatment Procedures Explained to Patient/Family  Yes    Evaluation and Treatment Procedures  agreed to    Pressure Injury Properties Date First Assessed: 07/06/17 Time First Assessed: 1318 Location: Heel Location Orientation: Right Staging: - Present on Admission: Yes   Dressing Type  None    Dressing  Other (Comment)    State of Healing  Closed wound edges    Site / Wound Assessment  Clean;Dry    % Wound base Red or Granulating  100%    Drainage Amount  None    Treatment  Debridement (Selective)    Selective Debridement - Location  Rt heel    Selective Debridement - Tools Used  Forceps;Scissors    Selective Debridement - Tissue Removed  callous edges    Wound Therapy - Clinical Statement  Cleansed area well and debrided away remaining dry tissue from wound borders.  No longer with opening/drainage present.  Pt with no longer a skilled need for therapy and is ready for discharge.  Instructed to keep area protected and moisturized.  Pt verbalized understanding.  Wound Plan  discharge.  wound is now healed.              PT Education - 12/03/17 0913    Education provided  Yes    Education Details  instructed to keep area protected and moisturized.  Instructed to check area daily to ensure no opened areas.    Person(s) Educated  Patient    Methods  Explanation    Comprehension  Verbalized understanding       PT Short Term Goals - 11/12/17 0853      PT SHORT TERM GOAL #1   Title  Wounds to be 100% granulated to prevent infection     Time  2    Period  Weeks    Status  On-going      PT SHORT TERM GOAL #2   Title  PT to verbalize the importance of visually checking her feet everyday for any wound     Time  2    Period  Weeks    Status   Achieved      PT SHORT TERM GOAL #3   Title  wound to depth to have decreased to .1 cm to allow closure of wound.     Time  3    Period  Weeks    Status  Achieved        PT Long Term Goals - 11/12/17 8280      PT LONG TERM GOAL #1   Title  Pt wound size to be no greater than .3x.3 x.1 to allow pt to feel comfortable with discharge from wound care.     Baseline  1/24 0.5X0.5X0.1cm    Time  6    Period  Weeks    Status  On-going              Patient will benefit from skilled therapeutic intervention in order to improve the following deficits and impairments:     Visit Diagnosis: Diabetic ulcer of right heel associated with type 2 diabetes mellitus, with necrosis of muscle ([place])     Problem List Patient Active Problem List   Diagnosis Date Noted  . 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 Dyspnea   . Coronary artery disease   . Severe mitral insufficiency   . Atrial flutter ([place])   . Mitral regurgitation 06/24/2017  . Cardiomyopathy ([place]) 06/20/2017  . Elevated troponin 06/19/2017  . CKD (chronic kidney disease) stage 3, GFR 30-59 ml/min (HCC) 06/19/2017  . Pressure injury of skin 06/19/2017  . Hyperkalemia 06/18/2017  . Type 2 diabetes mellitus, uncontrolled, with neuropathy ([place]) 06/18/2017  . Hyperlipidemia 06/18/2017  . Chest pain 06/18/2017  . Bronchopneumonia 03/06/2016  . CAP (community acquired pneumonia) 03/05/2016  . Insulin dependent diabetes mellitus ([place]) 03/05/2016  . Essential hypertension 03/05/2016  . Hyponatremia 03/05/2016  . Normocytic anemia 03/05/2016  . Hyperglycemia 03/05/2016  . Dehydration 03/05/2016  . Hypoxia 03/05/2016  . Leukocytosis 03/05/2016  . Acute respiratory failure with hypoxia ([place]) 03/05/2016   Teena Irani, PTA/CLT [place], PT CLT [phone] 12/03/2017, 9:14 AM  [place] [address] [place], [place], [address] Phone: [phone]   Fax:   [phone]  Name: Jill [name] MRN: [id number] Date of Birth: 06-Aug-1961  PHYSICAL THERAPY DISCHARGE SUMMARY  Visits from Start of Care: 10  Current functional level related to goals / functional outcomes: See above   Remaining deficits: See above   Education / Equipment: HEP Plan: Patient  agrees to discharge.  Patient goals were met. Patient is being discharged due to meeting the stated rehab goals.  ?????        contraindication

## 2017-12-10 ENCOUNTER — Ambulatory Visit (HOSPITAL_COMMUNITY): Payer: Medicaid Other | Admitting: Physical Therapy

## 2017-12-17 ENCOUNTER — Ambulatory Visit (HOSPITAL_COMMUNITY): Payer: Self-pay | Admitting: Physical Therapy

## 2017-12-22 ENCOUNTER — Other Ambulatory Visit: Payer: Self-pay

## 2017-12-22 ENCOUNTER — Encounter (HOSPITAL_COMMUNITY): Payer: Self-pay | Admitting: Emergency Medicine

## 2017-12-22 ENCOUNTER — Emergency Department (HOSPITAL_COMMUNITY)
Admission: EM | Admit: 2017-12-22 | Discharge: 2017-12-22 | Disposition: A | Discharge status: Home / Self Care | Payer: Medicaid Other | Attending: Emergency Medicine | Admitting: Emergency Medicine

## 2017-12-22 DIAGNOSIS — Z794 Long term (current) use of insulin: Secondary | ICD-10-CM | POA: Insufficient documentation

## 2017-12-22 DIAGNOSIS — N183 Chronic kidney disease, stage 3 (moderate): Secondary | ICD-10-CM | POA: Diagnosis not present

## 2017-12-22 DIAGNOSIS — Z79899 Other long term (current) drug therapy: Secondary | ICD-10-CM | POA: Insufficient documentation

## 2017-12-22 DIAGNOSIS — E875 Hyperkalemia: Secondary | ICD-10-CM | POA: Insufficient documentation

## 2017-12-22 DIAGNOSIS — Z951 Presence of aortocoronary bypass graft: Secondary | ICD-10-CM | POA: Insufficient documentation

## 2017-12-22 DIAGNOSIS — Z7982 Long term (current) use of aspirin: Secondary | ICD-10-CM | POA: Diagnosis not present

## 2017-12-22 DIAGNOSIS — E1122 Type 2 diabetes mellitus with diabetic chronic kidney disease: Secondary | ICD-10-CM | POA: Insufficient documentation

## 2017-12-22 DIAGNOSIS — I2510 Atherosclerotic heart disease of native coronary artery without angina pectoris: Secondary | ICD-10-CM | POA: Insufficient documentation

## 2017-12-22 DIAGNOSIS — I129 Hypertensive chronic kidney disease with stage 1 through stage 4 chronic kidney disease, or unspecified chronic kidney disease: Secondary | ICD-10-CM | POA: Insufficient documentation

## 2017-12-22 LAB — CBC WITH DIFFERENTIAL/PLATELET
BASOS ABS: 0.1 10*3/uL (ref 0.0–0.1)
BASOS PCT: 1 %
Eosinophils Absolute: 0.4 10*3/uL (ref 0.0–0.7)
Eosinophils Relative: 6 %
HCT: 35.1 % — ABNORMAL LOW (ref 36.0–46.0)
Hemoglobin: 10.4 g/dL — ABNORMAL LOW (ref 12.0–15.0)
Lymphocytes Relative: 32 %
Lymphs Abs: 2.1 10*3/uL (ref 0.7–4.0)
MCH: 24.1 pg — ABNORMAL LOW (ref 26.0–34.0)
MCHC: 29.6 g/dL — ABNORMAL LOW (ref 30.0–36.0)
MCV: 81.3 fL (ref 78.0–100.0)
MONOS PCT: 11 %
Monocytes Absolute: 0.7 10*3/uL (ref 0.1–1.0)
Neutro Abs: 3.3 10*3/uL (ref 1.7–7.7)
Neutrophils Relative %: 50 %
PLATELETS: 279 10*3/uL (ref 150–400)
RBC: 4.32 MIL/uL (ref 3.87–5.11)
RDW: 16.4 % — ABNORMAL HIGH (ref 11.5–15.5)
WBC: 6.6 10*3/uL (ref 4.0–10.5)

## 2017-12-22 LAB — BASIC METABOLIC PANEL
Anion gap: 5 (ref 5–15)
Anion gap: 8 (ref 5–15)
BUN: 33 mg/dL — AB (ref 6–20)
BUN: 23 mg/dL — ABNORMAL HIGH (ref 6–20)
CHLORIDE: 109 mmol/L (ref 101–111)
CHLORIDE: 107 mmol/L (ref 101–111)
CO2: 24 mmol/L (ref 22–32)
CO2: 23 mmol/L (ref 22–32)
CREATININE: 0.91 mg/dL (ref 0.44–1.00)
CREATININE: 0.83 mg/dL (ref 0.44–1.00)
Calcium: 8.3 mg/dL — ABNORMAL LOW (ref 8.9–10.3)
Calcium: 8.5 mg/dL — ABNORMAL LOW (ref 8.9–10.3)
GFR calc Af Amer: >60 mL/min (ref >60)
GFR calc Af Amer: >60 mL/min (ref >60)
GFR calc non Af Amer: >60 mL/min (ref >60)
GFR calc non Af Amer: >60 mL/min (ref >60)
Glucose, Bld: 130 mg/dL — ABNORMAL HIGH (ref 65–99)
Glucose, Bld: 145 mg/dL — ABNORMAL HIGH (ref 65–99)
Potassium: 6.1 mmol/L — ABNORMAL HIGH (ref 3.5–5.1)
Potassium: 4.8 mmol/L (ref 3.5–5.1)
SODIUM: 138 mmol/L (ref 135–145)
SODIUM: 138 mmol/L (ref 135–145)

## 2017-12-22 LAB — I-STAT TROPONIN, ED: Troponin i, poc: 0.01 ng/mL (ref 0.00–0.08)

## 2017-12-22 MED ORDER — SODIUM POLYSTYRENE SULFONATE 15 GM/60ML PO SUSP
30.0000 g | Freq: Once | ORAL | Status: AC
  Administered 2017-12-22: 30 g via ORAL
  Filled 2017-12-22: qty 120

## 2017-12-22 MED ORDER — SODIUM CHLORIDE 0.9 % IV BOLUS (SEPSIS)
1000.0000 mL | Freq: Once | INTRAVENOUS | Status: AC
  Administered 2017-12-22: 1000 mL via INTRAVENOUS

## 2017-12-22 MED ORDER — FUROSEMIDE 10 MG/ML IJ SOLN
40.0000 mg | Freq: Once | INTRAMUSCULAR | Status: AC
  Administered 2017-12-22: 40 mg via INTRAVENOUS
  Filled 2017-12-22: qty 4

## 2017-12-22 NOTE — ED Provider Notes (Signed)
[facility] EMERGENCY DEPARTMENT Provider Note   CSN: [id number] Arrival date & time: 12/22/17  1004     History   Chief Complaint Chief Complaint  Patient presents with  . Abnormal Lab    HPI Jill [name] is a 57 y.o. female.  She is here after getting a call from her clinic that her potassium was elevated.  She has had this problem before.  She does not have routine medical care and is seen in the health clinic.  She states she has had dizziness for over a year and that is unchanged.  She had a history of cardiac problems and had bypass.  She states the last time she had her elevated potassium it was due to dietary.  She was having routine blood work drawn to be seen for an upcoming appointment.  She states she has been more constipated than diarrhea and is urinating a normal amount.  No fevers no cough no chest pain.  The history is provided by the patient.  Abnormal Lab  Patient referred by:  PCP Result type: chemistry   Chemistry:    Potassium:  High   Past Medical History:  Diagnosis Date  . Diabetic neuropathy (HCC)   . Dyspnea   . Dysrhythmia   . Headache   . Hyperlipidemia   . Hypertension   . Type 2 diabetes mellitus [facility])     Patient Active Problem List   Diagnosis Date Noted  . 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 Dyspnea   . Coronary artery disease   . Severe mitral insufficiency   . Atrial flutter (HCC)   . Mitral regurgitation 06/24/2017  . Cardiomyopathy (HCC) 06/20/2017  . Elevated troponin 06/19/2017  . CKD (chronic kidney disease) stage 3, GFR 30-59 ml/min (HCC) 06/19/2017  . Pressure injury of skin 06/19/2017  . Hyperkalemia 06/18/2017  . Type 2 diabetes mellitus, uncontrolled, with neuropathy (HCC) 06/18/2017  . Hyperlipidemia 06/18/2017  . Chest pain 06/18/2017  . Bronchopneumonia 03/06/2016  . CAP (community acquired pneumonia) 03/05/2016  . Insulin dependent diabetes mellitus (HCC) 03/05/2016  . Essential hypertension 03/05/2016  .  Hyponatremia 03/05/2016  . Normocytic anemia 03/05/2016  . Hyperglycemia 03/05/2016  . Dehydration 03/05/2016  . Hypoxia 03/05/2016  . Leukocytosis 03/05/2016  . Acute respiratory failure with hypoxia (HCC) 03/05/2016    Past Surgical History:  Procedure Laterality Date  . CORONARY ARTERY BYPASS GRAFT N/A 07/22/2017   Procedure: CORONARY ARTERY BYPASS GRAFTING (CABG) x four, using left internal mammary artery and right leg greater saphenous vein harvested endoscopically;  Surgeon: Loreli Slot, MD;  Location: [facility] OR;  Service: Open Heart Surgery;  Laterality: N/A;  . RIGHT/LEFT HEART CATH AND CORONARY ANGIOGRAPHY N/A 06/26/2017   Procedure: RIGHT/LEFT HEART CATH AND CORONARY ANGIOGRAPHY;  Surgeon: Marykay Lex, MD;  Location: [facility] INVASIVE CV LAB;  Service: Cardiovascular;  Laterality: N/A;  . TEE WITHOUT CARDIOVERSION N/A 06/25/2017   Procedure: TRANSESOPHAGEAL ECHOCARDIOGRAM (TEE);  Surgeon: Thurmon Fair, MD;  Location: [facility] ENDOSCOPY;  Service: Cardiovascular;  Laterality: N/A;  . TEE WITHOUT CARDIOVERSION N/A 07/22/2017   Procedure: TRANSESOPHAGEAL ECHOCARDIOGRAM (TEE);  Surgeon: Loreli Slot, MD;  Location: [facility] OR;  Service: Open Heart Surgery;  Laterality: N/A;  . TUBAL LIGATION      OB History    Gravida Para Term Preterm AB Living   4 4 4          SAB TAB Ectopic Multiple Live Births  Home Medications    Prior to Admission medications   Medication Sig Start Date End Date Taking? Authorizing Provider  acetaminophen (TYLENOL) 500 MG tablet Take 2 tablets (1,000 mg total) by mouth every 6 (six) hours as needed. 07/27/17   Barrett, Rae Roam, PA-C  aspirin EC 81 MG tablet Take 81 mg by mouth daily.    [provider]  Dulaglutide (TRULICITY) 0.75 MG/0.5ML SOPN Inject 0.75 mg into the skin every 7 (seven) days. tuesdays    [provider]  gabapentin (NEURONTIN) 300 MG capsule Take 300 mg by mouth 4 (four) times daily.    [provider]  insulin detemir (LEVEMIR) 100 UNIT/ML injection Inject 25 Units into the skin 2 (two) times daily.     [provider]  metoprolol succinate (TOPROL-XL) 25 MG 24 hr tablet Take 0.5 tablets (12.5 mg total) by mouth daily. 10/05/17   Jodelle Gross, NP  rosuvastatin (CRESTOR) 20 MG tablet Take 1 tablet (20 mg total) by mouth at bedtime. 10/05/17   Jodelle Gross, NP  silver sulfADIAZINE (SILVADENE) 1 % cream Apply 1 application topically daily.    [provider]  vitamin C (ASCORBIC ACID) 500 MG tablet Take 500 mg by mouth daily.    [provider]    Family History Family History  Problem Relation Age of Onset  . Diabetes type II Other   . Hypertension Other     Social History Social History   Tobacco Use  . Smoking status: Never Smoker  . Smokeless tobacco: Never Used  Substance Use Topics  . Alcohol use: No  . Drug use: No     Allergies   Patient has no known allergies.   Review of Systems Review of Systems  Constitutional: Negative for chills and fever.  HENT: Negative for ear pain and sore throat.   Eyes: Negative for pain and visual disturbance.  Respiratory: Negative for cough and shortness of breath.   Cardiovascular: Negative for chest pain and palpitations.  Gastrointestinal: Positive for constipation. Negative for abdominal pain and vomiting.  Genitourinary: Negative for dysuria and hematuria.  Musculoskeletal: Negative for arthralgias and back pain.  Skin: Negative for color change and rash.  Neurological: Positive for dizziness. Negative for seizures and syncope.  All other systems reviewed and are negative.    Physical Exam Updated Vital Signs BP 110/61 (BP Location: Left Arm)   Pulse 71   Temp 99.1 F (37.3 C) (Oral)   Resp 18   Ht 5\' 2"  (1.575 m)   Wt 99.1 kg (218 lb 6.4 oz)   SpO2 97%   BMI 39.95 kg/m   Physical Exam  Constitutional: She appears well-developed and well-nourished. No  distress.  HENT:  Head: Normocephalic and atraumatic.  Eyes: Conjunctivae are normal.  Neck: Neck supple.  Cardiovascular: Normal rate and regular rhythm.  No murmur heard. Pulmonary/Chest: Effort normal and breath sounds normal. No respiratory distress.  Abdominal: Soft. There is no tenderness.  Musculoskeletal: She exhibits no edema, tenderness or deformity.  Neurological: She is alert.  Skin: Skin is warm and dry.  Psychiatric: She has a normal mood and affect.  Nursing note and vitals reviewed.    ED Treatments / Results  Labs (all labs ordered are listed, but only abnormal results are displayed) Labs Reviewed  BASIC METABOLIC PANEL - Abnormal; Notable for the following components:      Result Value   Potassium 6.1 (*)    Glucose, Bld 130 (*)  BUN 33 (*)    Calcium 8.3 (*)    All other components within normal limits    EKG  EKG Interpretation  Date/Time:  Tuesday December 22 2017 14:30:42 EST Ventricular Rate:  69 PR Interval:    QRS Duration: 88 QT Interval:  418 QTC Calculation: 448 R Axis:   71 Text Interpretation:  Sinus rhythm Low voltage, precordial leads Nonspecific T abnormalities, lateral leads no hyperacute t's Confirmed by Meridee Score [phone]) on 12/22/2017 2:42:39 PM       Radiology No results found.  Procedures Procedures (including critical care time)  Medications Ordered in ED Medications  sodium chloride 0.9 % bolus 1,000 mL (0 mLs Intravenous Stopped 12/22/17 1721)  sodium polystyrene (KAYEXALATE) 15 GM/60ML suspension 30 g (30 g Oral Given 12/22/17 1517)  furosemide (LASIX) injection 40 mg (40 mg Intravenous Given 12/22/17 1518)     Initial Impression / Assessment and Plan / ED Course  I have reviewed the triage vital signs and the nursing notes.  Pertinent labs & imaging results that were available during my care of the patient were reviewed by me and considered in my medical decision making (see chart for details).  Clinical Course as  of Dec 24 1811  Tue Dec 22, 2017  1839 Patient's potassium has improved with medication.  I told her we be able to let her go home and she needs to follow-up with her doctor and she is very grateful.  [MB]    Clinical Course User Index [MB] Terrilee Files, MD     Final Clinical Impressions(s) / ED Diagnoses   Final diagnoses:  Hyperkalemia    ED Discharge Orders    None       Terrilee Files, MD 12/23/17 [phone]

## 2017-12-22 NOTE — ED Notes (Signed)
Pt called x1 for triage. No answer

## 2017-12-22 NOTE — Discharge Instructions (Signed)
Your evaluated in the emergency department for a high potassium.  You received some medications and that lowered her potassium to a safe level.  You need to follow-up with your doctor to have them review your medications and make sure that nothing is causing the high potassium.

## 2017-12-22 NOTE — ED Triage Notes (Signed)
Pt was called by the [facility] about an abnormal lab result. Pt states her potassium is high. No other complaints or pain.

## 2017-12-24 ENCOUNTER — Ambulatory Visit (HOSPITAL_COMMUNITY): Payer: Self-pay | Admitting: Physical Therapy

## 2017-12-31 ENCOUNTER — Encounter (HOSPITAL_COMMUNITY): Payer: Self-pay | Admitting: Emergency Medicine

## 2017-12-31 ENCOUNTER — Emergency Department (HOSPITAL_COMMUNITY)
Admission: EM | Admit: 2017-12-31 | Discharge: 2017-12-31 | Disposition: A | Discharge status: Home / Self Care | Payer: Medicaid Other | Attending: Emergency Medicine | Admitting: Emergency Medicine

## 2017-12-31 ENCOUNTER — Other Ambulatory Visit: Payer: Self-pay

## 2017-12-31 DIAGNOSIS — N183 Chronic kidney disease, stage 3 (moderate): Secondary | ICD-10-CM | POA: Insufficient documentation

## 2017-12-31 DIAGNOSIS — Z79899 Other long term (current) drug therapy: Secondary | ICD-10-CM | POA: Diagnosis not present

## 2017-12-31 DIAGNOSIS — E875 Hyperkalemia: Secondary | ICD-10-CM | POA: Insufficient documentation

## 2017-12-31 DIAGNOSIS — E1140 Type 2 diabetes mellitus with diabetic neuropathy, unspecified: Secondary | ICD-10-CM | POA: Insufficient documentation

## 2017-12-31 DIAGNOSIS — Z794 Long term (current) use of insulin: Secondary | ICD-10-CM | POA: Insufficient documentation

## 2017-12-31 DIAGNOSIS — E1122 Type 2 diabetes mellitus with diabetic chronic kidney disease: Secondary | ICD-10-CM | POA: Insufficient documentation

## 2017-12-31 DIAGNOSIS — R799 Abnormal finding of blood chemistry, unspecified: Secondary | ICD-10-CM | POA: Diagnosis present

## 2017-12-31 DIAGNOSIS — I129 Hypertensive chronic kidney disease with stage 1 through stage 4 chronic kidney disease, or unspecified chronic kidney disease: Secondary | ICD-10-CM | POA: Insufficient documentation

## 2017-12-31 DIAGNOSIS — Z951 Presence of aortocoronary bypass graft: Secondary | ICD-10-CM | POA: Diagnosis not present

## 2017-12-31 DIAGNOSIS — Z7982 Long term (current) use of aspirin: Secondary | ICD-10-CM | POA: Diagnosis not present

## 2017-12-31 LAB — BASIC METABOLIC PANEL
Anion gap: 7 (ref 5–15)
BUN: 32 mg/dL — ABNORMAL HIGH (ref 6–20)
CO2: 25 mmol/L (ref 22–32)
CREATININE: 0.9 mg/dL (ref 0.44–1.00)
Calcium: 8.8 mg/dL — ABNORMAL LOW (ref 8.9–10.3)
Chloride: 108 mmol/L (ref 101–111)
GFR calc non Af Amer: >60 mL/min (ref >60)
Glucose, Bld: 187 mg/dL — ABNORMAL HIGH (ref 65–99)
Potassium: 6.3 mmol/L (ref 3.5–5.1)
SODIUM: 140 mmol/L (ref 135–145)

## 2017-12-31 LAB — HEPATIC FUNCTION PANEL
ALBUMIN: 3.5 g/dL (ref 3.5–5.0)
ALT: 25 U/L (ref 14–54)
AST: 29 U/L (ref 15–41)
Alkaline Phosphatase: 100 U/L (ref 38–126)
Bilirubin, Direct: <0.1 mg/dL — ABNORMAL LOW (ref 0.1–0.5)
TOTAL PROTEIN: 7.8 g/dL (ref 6.5–8.1)
Total Bilirubin: 0.5 mg/dL (ref 0.3–1.2)

## 2017-12-31 LAB — CK: CK TOTAL: 81 U/L (ref 38–234)

## 2017-12-31 MED ORDER — SODIUM POLYSTYRENE SULFONATE 15 GM/60ML PO SUSP
30.0000 g | Freq: Once | ORAL | Status: AC
  Administered 2017-12-31: 30 g via ORAL
  Filled 2017-12-31: qty 120

## 2017-12-31 MED ORDER — METOPROLOL SUCCINATE ER 25 MG PO TB24: 12.5000 mg | ORAL_TABLET | Freq: Every day | ORAL | 1 refills | Status: DC

## 2017-12-31 NOTE — ED Provider Notes (Addendum)
[facility] EMERGENCY DEPARTMENT Provider Note   CSN: [id number] Arrival date & time: 12/31/17  6962     History   Chief Complaint Chief Complaint  Patient presents with  . Abnormal Lab    HPI Jill [name] is a 57 y.o. female.  HPI  The patient is a 57 year old female with a known history of type 2 diabetes, hypertension, hyperlipidemia as well as a history of bypass grafting in the past, she currently takes hydrochlorothiazide, insulin, Trulicity, gabapentin, insulin, Crestor.  She was initially seen in the emergency department on 5 March during which time she was given Kayexalate, Lasix, had improvement in her potassium level and was going to follow-up with her family doctor which she did today.  When she followed up, she was told that her potassium level was again elevated at 6.2, she is asymptomatic and states that other than having chronic dizziness she has no other acute problems today.  She takes hydrochlorothiazide for the swelling in her legs.  She denies lightheadedness chest pain shortness of breath weakness nausea or vomiting today.  She was redirected back to the emergency department because of the hyperkalemia.  Past Medical History:  Diagnosis Date  . Diabetic neuropathy (HCC)   . Dyspnea   . Dysrhythmia   . Headache   . Hyperlipidemia   . Hypertension   . Type 2 diabetes mellitus [facility])     Patient Active Problem List   Diagnosis Date Noted  . 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 Dyspnea   . Coronary artery disease   . Severe mitral insufficiency   . Atrial flutter (HCC)   . Mitral regurgitation 06/24/2017  . Cardiomyopathy (HCC) 06/20/2017  . Elevated troponin 06/19/2017  . CKD (chronic kidney disease) stage 3, GFR 30-59 ml/min (HCC) 06/19/2017  . Pressure injury of skin 06/19/2017  . Hyperkalemia 06/18/2017  . Type 2 diabetes mellitus, uncontrolled, with neuropathy (HCC) 06/18/2017  . Hyperlipidemia 06/18/2017  . Chest pain 06/18/2017  .  Bronchopneumonia 03/06/2016  . CAP (community acquired pneumonia) 03/05/2016  . Insulin dependent diabetes mellitus (HCC) 03/05/2016  . Essential hypertension 03/05/2016  . Hyponatremia 03/05/2016  . Normocytic anemia 03/05/2016  . Hyperglycemia 03/05/2016  . Dehydration 03/05/2016  . Hypoxia 03/05/2016  . Leukocytosis 03/05/2016  . Acute respiratory failure with hypoxia (HCC) 03/05/2016    Past Surgical History:  Procedure Laterality Date  . CORONARY ARTERY BYPASS GRAFT N/A 07/22/2017   Procedure: CORONARY ARTERY BYPASS GRAFTING (CABG) x four, using left internal mammary artery and right leg greater saphenous vein harvested endoscopically;  Surgeon: Loreli Slot, MD;  Location: [facility] OR;  Service: Open Heart Surgery;  Laterality: N/A;  . RIGHT/LEFT HEART CATH AND CORONARY ANGIOGRAPHY N/A 06/26/2017   Procedure: RIGHT/LEFT HEART CATH AND CORONARY ANGIOGRAPHY;  Surgeon: Marykay Lex, MD;  Location: [facility] INVASIVE CV LAB;  Service: Cardiovascular;  Laterality: N/A;  . TEE WITHOUT CARDIOVERSION N/A 06/25/2017   Procedure: TRANSESOPHAGEAL ECHOCARDIOGRAM (TEE);  Surgeon: Thurmon Fair, MD;  Location: [facility] ENDOSCOPY;  Service: Cardiovascular;  Laterality: N/A;  . TEE WITHOUT CARDIOVERSION N/A 07/22/2017   Procedure: TRANSESOPHAGEAL ECHOCARDIOGRAM (TEE);  Surgeon: Loreli Slot, MD;  Location: [facility] OR;  Service: Open Heart Surgery;  Laterality: N/A;  . TUBAL LIGATION      OB History    Gravida Para Term Preterm AB Living   4 4 4          SAB TAB Ectopic Multiple Live Births  Home Medications    Prior to Admission medications   Medication Sig Start Date End Date Taking? Authorizing Provider  acetaminophen (TYLENOL) 500 MG tablet Take 2 tablets (1,000 mg total) by mouth every 6 (six) hours as needed. 07/27/17  Yes Barrett, Rae Roam, PA-C  aspirin EC 81 MG tablet Take 81 mg by mouth daily.   Yes [provider]  gabapentin (NEURONTIN) 400 MG capsule Take  400 mg by mouth 3 (three) times daily as needed (foot pain).    Yes [provider]  hydrochlorothiazide (HYDRODIURIL) 25 MG tablet Take 25 mg by mouth daily.   Yes [provider]  insulin detemir (LEVEMIR) 100 UNIT/ML injection Inject 40 Units into the skin 2 (two) times daily.    Yes [provider]  loratadine (CLARITIN) 10 MG tablet Take 10 mg by mouth daily.   Yes [provider]  midodrine (PROAMATINE) 5 MG tablet Take 5 mg by mouth 2 (two) times daily with a meal.   Yes [provider]  rosuvastatin (CRESTOR) 20 MG tablet Take 1 tablet (20 mg total) by mouth at bedtime. 10/05/17  Yes Jodelle Gross, NP  silver sulfADIAZINE (SILVADENE) 1 % cream Apply 1 application topically daily.   Yes [provider]  vitamin C (ASCORBIC ACID) 500 MG tablet Take 500 mg by mouth daily.   Yes [provider]  Dulaglutide (TRULICITY) 0.75 MG/0.5ML SOPN Inject 0.75 mg into the skin every 7 (seven) days. tuesdays    [provider]  metoprolol succinate (TOPROL-XL) 25 MG 24 hr tablet Take 0.5 tablets (12.5 mg total) by mouth daily. 12/31/17 03/01/18  Eber Hong, MD    Family History Family History  Problem Relation Age of Onset  . Diabetes type II Other   . Hypertension Other     Social History Social History   Tobacco Use  . Smoking status: Never Smoker  . Smokeless tobacco: Never Used  Substance Use Topics  . Alcohol use: No  . Drug use: No     Allergies   Patient has no known allergies.   Review of Systems Review of Systems  All other systems reviewed and are negative.    Physical Exam Updated Vital Signs BP 134/67   Pulse 77   Temp 98.3 F (36.8 C) (Oral)   Resp 14   Ht 5\' 2"  (1.575 m)   Wt 98.9 kg (218 lb)   SpO2 97%   BMI 39.87 kg/m   Physical Exam  Constitutional: She appears well-developed and well-nourished. No distress.  HENT:  Head: Normocephalic and atraumatic.  Mouth/Throat:  Oropharynx is clear and moist. No oropharyngeal exudate.  Eyes: Conjunctivae and EOM are normal. Pupils are equal, round, and reactive to light. Right eye exhibits no discharge. Left eye exhibits no discharge. No scleral icterus.  Neck: Normal range of motion. Neck supple. No JVD present. No thyromegaly present.  Cardiovascular: Normal rate, regular rhythm, normal heart sounds and intact distal pulses. Exam reveals no gallop and no friction rub.  No murmur heard. Pulmonary/Chest: Effort normal and breath sounds normal. No respiratory distress. She has no wheezes. She has no rales.  Abdominal: Soft. Bowel sounds are normal. She exhibits no distension and no mass. There is no tenderness.  Musculoskeletal: Normal range of motion. She exhibits edema ( Bilateral symmetrical 1+ pitting edema below the knees). She exhibits no tenderness.  Lymphadenopathy:    She has no cervical adenopathy.  Neurological: She is alert. Coordination normal.  There is clear  speech, normal coordination, normal strength in all 4 extremities, normal facial symmetry, cranial nerves III through XII are normal, the patient is able to answer my questions appropriately, follow commands without difficulty and has no tremor  Skin: Skin is warm and dry. No rash noted. No erythema.  Psychiatric: She has a normal mood and affect. Her behavior is normal.  Nursing note and vitals reviewed.    ED Treatments / Results  Labs (all labs ordered are listed, but only abnormal results are displayed) Labs Reviewed  BASIC METABOLIC PANEL - Abnormal; Notable for the following components:      Result Value   Potassium 6.3 (*)    Glucose, Bld 187 (*)    BUN 32 (*)    Calcium 8.8 (*)    All other components within normal limits  HEPATIC FUNCTION PANEL - Abnormal; Notable for the following components:   Bilirubin, Direct <0.1 (*)    All other components within normal limits  CK    EKG  EKG Interpretation  Date/Time:  Thursday December 31 2017 12:09:32 EDT Ventricular Rate:  75 PR Interval:    QRS Duration: 87 QT Interval:  393 QTC Calculation: 439 R Axis:   80 Text Interpretation:  Sinus rhythm Borderline T wave abnormalities since last tracing no significant change Confirmed by Eber Hong ([id number]) on 12/31/2017 3:19:32 PM       Radiology No results found.  Procedures Procedures (including critical care time)  Medications Ordered in ED Medications  sodium polystyrene (KAYEXALATE) 15 GM/60ML suspension 30 g (30 g Oral Given 12/31/17 1222)     Initial Impression / Assessment and Plan / ED Course  I have reviewed the triage vital signs and the nursing notes.  Pertinent labs & imaging results that were available during my care of the patient were reviewed by me and considered in my medical decision making (see chart for details).  Clinical Course as of Dec 31 1557  Thu Dec 31, 2017  1357 CK Total: 81 [BM]  1357 AST: 29 [BM]  1357 ALT: 25 [BM]  1357 Sodium: 140 [BM]  1357 Potassium: (!!) 6.3 [BM]  1357 Glucose: (!) 187 [BM]  1357 Labs reviewed, LFTs and creatinine kinase are normal. BUN: (!) 32 [BM]    Clinical Course User Index [BM] Eber Hong, MD   Patient's exam is unremarkable, her laboratory workup shows a potassium of 6.3, there is some hyperglycemia, there is no significant renal dysfunction though her BUN is elevated at 32 and a creatinine of 0.9.  At this time it would be reasonable to repeat the Kayexalate dose however the question becomes why she is persistently hyperkalemic.  She is on a statin however she has no signs of rhabdomyolysis, no muscular tenderness, no generalized weakness.  She does have some peripheral edema which she states is chronic.  Metoprolol has been shown to cause some Hyper K.  Will stop the Metoprolol and add alternative   Discussed case with cardiologist who agrees that metoprolol can be reduced to 12.5 mg once a day.  Final Clinical Impressions(s) / ED Diagnoses    Final diagnoses:  Hyperkalemia    ED Discharge Orders        Ordered    metoprolol succinate (TOPROL-XL) 25 MG 24 hr tablet  Daily     12/31/17 1557       Eber Hong, MD 12/31/17 1557    Eber Hong, MD 12/31/17 [phone]

## 2017-12-31 NOTE — Discharge Instructions (Signed)
Reduce your dose of Metoprolol to 12.5 mg by mouth once a day See your doctor in the office for follow up in 1 week You MUST see the Nephrologist in the next week for a follow up and for further evaluation of your high Potassium

## 2017-12-31 NOTE — ED Triage Notes (Signed)
Pt was seen at the [facility] for blood work.  They stated that her potassium was elevated.  Unknown result.

## 2017-12-31 NOTE — ED Notes (Signed)
Date and time results received: 12/31/17 11:34 AM  (use smartphrase ".now" to insert current time)  Test: K+ Critical Value: 6.3  Name of Provider Notified: [place]  Orders Received? Or Actions Taken?: Orders Received - See Orders for details

## 2018-01-07 ENCOUNTER — Encounter (HOSPITAL_COMMUNITY): Payer: Self-pay | Admitting: Emergency Medicine

## 2018-01-07 ENCOUNTER — Other Ambulatory Visit: Payer: Self-pay

## 2018-01-07 ENCOUNTER — Emergency Department (HOSPITAL_COMMUNITY)
Admission: EM | Admit: 2018-01-07 | Discharge: 2018-01-07 | Disposition: A | Discharge status: Home / Self Care | Payer: Medicaid Other | Attending: Emergency Medicine | Admitting: Emergency Medicine

## 2018-01-07 DIAGNOSIS — E1122 Type 2 diabetes mellitus with diabetic chronic kidney disease: Secondary | ICD-10-CM | POA: Diagnosis not present

## 2018-01-07 DIAGNOSIS — I129 Hypertensive chronic kidney disease with stage 1 through stage 4 chronic kidney disease, or unspecified chronic kidney disease: Secondary | ICD-10-CM | POA: Insufficient documentation

## 2018-01-07 DIAGNOSIS — E875 Hyperkalemia: Secondary | ICD-10-CM | POA: Insufficient documentation

## 2018-01-07 DIAGNOSIS — Z79899 Other long term (current) drug therapy: Secondary | ICD-10-CM | POA: Insufficient documentation

## 2018-01-07 DIAGNOSIS — Z951 Presence of aortocoronary bypass graft: Secondary | ICD-10-CM | POA: Insufficient documentation

## 2018-01-07 DIAGNOSIS — R079 Chest pain, unspecified: Secondary | ICD-10-CM | POA: Diagnosis not present

## 2018-01-07 DIAGNOSIS — R799 Abnormal finding of blood chemistry, unspecified: Secondary | ICD-10-CM | POA: Diagnosis present

## 2018-01-07 DIAGNOSIS — Z794 Long term (current) use of insulin: Secondary | ICD-10-CM | POA: Diagnosis not present

## 2018-01-07 DIAGNOSIS — I2510 Atherosclerotic heart disease of native coronary artery without angina pectoris: Secondary | ICD-10-CM | POA: Insufficient documentation

## 2018-01-07 DIAGNOSIS — E1140 Type 2 diabetes mellitus with diabetic neuropathy, unspecified: Secondary | ICD-10-CM | POA: Diagnosis not present

## 2018-01-07 DIAGNOSIS — N183 Chronic kidney disease, stage 3 (moderate): Secondary | ICD-10-CM | POA: Insufficient documentation

## 2018-01-07 DIAGNOSIS — Z7982 Long term (current) use of aspirin: Secondary | ICD-10-CM | POA: Diagnosis not present

## 2018-01-07 LAB — CBC WITH DIFFERENTIAL/PLATELET
Basophils Absolute: 0.1 10*3/uL (ref 0.0–0.1)
Basophils Relative: 1 %
EOS ABS: 0.5 10*3/uL (ref 0.0–0.7)
EOS PCT: 5 %
HCT: 33.5 % — ABNORMAL LOW (ref 36.0–46.0)
Hemoglobin: 9.8 g/dL — ABNORMAL LOW (ref 12.0–15.0)
LYMPHS ABS: 2.1 10*3/uL (ref 0.7–4.0)
Lymphocytes Relative: 22 %
MCH: 23.8 pg — AB (ref 26.0–34.0)
MCHC: 29.3 g/dL — AB (ref 30.0–36.0)
MCV: 81.5 fL (ref 78.0–100.0)
MONO ABS: 0.8 10*3/uL (ref 0.1–1.0)
MONOS PCT: 8 %
Neutro Abs: 6.3 10*3/uL (ref 1.7–7.7)
Neutrophils Relative %: 64 %
PLATELETS: 319 10*3/uL (ref 150–400)
RBC: 4.11 MIL/uL (ref 3.87–5.11)
RDW: 15.4 % (ref 11.5–15.5)
WBC: 9.6 10*3/uL (ref 4.0–10.5)

## 2018-01-07 LAB — BASIC METABOLIC PANEL
Anion gap: 9 (ref 5–15)
BUN: 33 mg/dL — AB (ref 6–20)
CALCIUM: 8.9 mg/dL (ref 8.9–10.3)
CHLORIDE: 107 mmol/L (ref 101–111)
CO2: 24 mmol/L (ref 22–32)
CREATININE: 0.91 mg/dL (ref 0.44–1.00)
GFR calc Af Amer: >60 mL/min (ref >60)
Glucose, Bld: 313 mg/dL — ABNORMAL HIGH (ref 65–99)
Potassium: 5.9 mmol/L — ABNORMAL HIGH (ref 3.5–5.1)
SODIUM: 140 mmol/L (ref 135–145)

## 2018-01-07 MED ORDER — FUROSEMIDE 10 MG/ML IJ SOLN
40.0000 mg | Freq: Once | INTRAMUSCULAR | Status: AC
  Administered 2018-01-07: 40 mg via INTRAVENOUS
  Filled 2018-01-07: qty 4

## 2018-01-07 MED ORDER — SODIUM POLYSTYRENE SULFONATE 15 GM/60ML PO SUSP
30.0000 g | Freq: Once | ORAL | Status: AC
  Administered 2018-01-07: 30 g via ORAL
  Filled 2018-01-07: qty 120

## 2018-01-07 MED ORDER — SODIUM CHLORIDE 0.9 % IV BOLUS (SEPSIS)
500.0000 mL | Freq: Once | INTRAVENOUS | Status: AC
  Administered 2018-01-07: 500 mL via INTRAVENOUS

## 2018-01-07 MED ORDER — SODIUM POLYSTYRENE SULFONATE 15 GM/60ML PO SUSP
15.0000 g | Freq: Once | ORAL | Status: AC
  Administered 2018-01-07: 15 g via ORAL
  Filled 2018-01-07: qty 60

## 2018-01-07 NOTE — ED Notes (Signed)
Patient instructed to take 15g/5ml (2) of kayexalate tomorrow morning.  Patient verbalized understanding.

## 2018-01-07 NOTE — ED Triage Notes (Addendum)
Pt reports history of same and was told potassium level remains high. Pt reports had blood work drawn yesterday and was informed today of abnormal lab. Pt reports has appointment with kidney specialist tomorrow and was told could not be seen until potassium level is lowered. pt denies chest pain, shortness of breath, v/d. Pt reports neck pain x several weeks.

## 2018-01-07 NOTE — ED Provider Notes (Signed)
[facility] EMERGENCY DEPARTMENT Provider Note   CSN: [id number] Arrival date & time: 01/07/18  1701     History   Chief Complaint Chief Complaint  Patient presents with  . Abnormal Lab    HPI Jill [name] is a 57 y.o. female.  Patient was sent over here because she had elevated potassium.  Potassium was 6.1 and we will recheck it was 5.9 patient.  Patient has a history of hyperkalemia.  She sees a renal doctor tomorrow  The history is provided by the patient. No language interpreter was used.  Illness  This is a recurrent problem. The current episode started 2 days ago. The problem occurs constantly. The problem has not changed since onset.Associated symptoms include chest pain. Pertinent negatives include no abdominal pain and no headaches. Nothing aggravates the symptoms. Nothing relieves the symptoms.    Past Medical History:  Diagnosis Date  . Diabetic neuropathy (HCC)   . Dyspnea   . Dysrhythmia   . Headache   . Hyperlipidemia   . Hypertension   . Type 2 diabetes mellitus [facility])     Patient Active Problem List   Diagnosis Date Noted  . 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 Dyspnea   . Coronary artery disease   . Severe mitral insufficiency   . Atrial flutter (HCC)   . Mitral regurgitation 06/24/2017  . Cardiomyopathy (HCC) 06/20/2017  . Elevated troponin 06/19/2017  . CKD (chronic kidney disease) stage 3, GFR 30-59 ml/min (HCC) 06/19/2017  . Pressure injury of skin 06/19/2017  . Hyperkalemia 06/18/2017  . Type 2 diabetes mellitus, uncontrolled, with neuropathy (HCC) 06/18/2017  . Hyperlipidemia 06/18/2017  . Chest pain 06/18/2017  . Bronchopneumonia 03/06/2016  . CAP (community acquired pneumonia) 03/05/2016  . Insulin dependent diabetes mellitus (HCC) 03/05/2016  . Essential hypertension 03/05/2016  . Hyponatremia 03/05/2016  . Normocytic anemia 03/05/2016  . Hyperglycemia 03/05/2016  . Dehydration 03/05/2016  . Hypoxia 03/05/2016  . Leukocytosis  03/05/2016  . Acute respiratory failure with hypoxia (HCC) 03/05/2016    Past Surgical History:  Procedure Laterality Date  . CORONARY ARTERY BYPASS GRAFT N/A 07/22/2017   Procedure: CORONARY ARTERY BYPASS GRAFTING (CABG) x four, using left internal mammary artery and right leg greater saphenous vein harvested endoscopically;  Surgeon: Loreli Slot, MD;  Location: [facility] OR;  Service: Open Heart Surgery;  Laterality: N/A;  . RIGHT/LEFT HEART CATH AND CORONARY ANGIOGRAPHY N/A 06/26/2017   Procedure: RIGHT/LEFT HEART CATH AND CORONARY ANGIOGRAPHY;  Surgeon: Marykay Lex, MD;  Location: [facility] INVASIVE CV LAB;  Service: Cardiovascular;  Laterality: N/A;  . TEE WITHOUT CARDIOVERSION N/A 06/25/2017   Procedure: TRANSESOPHAGEAL ECHOCARDIOGRAM (TEE);  Surgeon: Thurmon Fair, MD;  Location: [facility] ENDOSCOPY;  Service: Cardiovascular;  Laterality: N/A;  . TEE WITHOUT CARDIOVERSION N/A 07/22/2017   Procedure: TRANSESOPHAGEAL ECHOCARDIOGRAM (TEE);  Surgeon: Loreli Slot, MD;  Location: [facility] OR;  Service: Open Heart Surgery;  Laterality: N/A;  . TUBAL LIGATION      OB History    Gravida  4   Para  4   Term  4   Preterm      AB      Living        SAB      TAB      Ectopic      Multiple      Live Births               Home Medications    Prior to Admission medications  Medication Sig Start Date End Date Taking? Authorizing Provider  acetaminophen (TYLENOL) 500 MG tablet Take 2 tablets (1,000 mg total) by mouth every 6 (six) hours as needed. 07/27/17  Yes Barrett, Rae Roam, PA-C  aspirin EC 81 MG tablet Take 81 mg by mouth daily.   Yes [provider]  Dulaglutide (TRULICITY) 0.75 MG/0.5ML SOPN Inject 0.75 mg into the skin every 7 (seven) days. tuesdays   Yes [provider]  gabapentin (NEURONTIN) 400 MG capsule Take 400 mg by mouth 3 (three) times daily as needed (foot pain).    Yes [provider]  hydrochlorothiazide (HYDRODIURIL) 25 MG tablet  Take 25 mg by mouth daily.   Yes [provider]  insulin detemir (LEVEMIR) 100 UNIT/ML injection Inject 40 Units into the skin 2 (two) times daily.    Yes [provider]  loratadine (CLARITIN) 10 MG tablet Take 10 mg by mouth daily.   Yes [provider]  metoprolol succinate (TOPROL-XL) 25 MG 24 hr tablet Take 0.5 tablets (12.5 mg total) by mouth daily. 12/31/17 03/01/18 Yes Eber Hong, MD  midodrine (PROAMATINE) 5 MG tablet Take 5 mg by mouth 2 (two) times daily with a meal.   Yes [provider]  rosuvastatin (CRESTOR) 20 MG tablet Take 1 tablet (20 mg total) by mouth at bedtime. 10/05/17  Yes Jodelle Gross, NP  silver sulfADIAZINE (SILVADENE) 1 % cream Apply 1 application topically daily.   Yes [provider]  vitamin C (ASCORBIC ACID) 500 MG tablet Take 500 mg by mouth daily.   Yes [provider]    Family History Family History  Problem Relation Age of Onset  . Diabetes type II Other   . Hypertension Other     Social History Social History   Tobacco Use  . Smoking status: Never Smoker  . Smokeless tobacco: Never Used  Substance Use Topics  . Alcohol use: No  . Drug use: No     Allergies   Patient has no known allergies.   Review of Systems Review of Systems  Constitutional: Negative for appetite change and fatigue.  HENT: Negative for congestion, ear discharge and sinus pressure.   Eyes: Negative for discharge.  Respiratory: Negative for cough.   Cardiovascular: Positive for chest pain.  Gastrointestinal: Negative for abdominal pain and diarrhea.  Genitourinary: Negative for frequency and hematuria.  Musculoskeletal: Negative for back pain.  Skin: Negative for rash.  Neurological: Negative for seizures and headaches.  Psychiatric/Behavioral: Negative for hallucinations.     Physical Exam Updated Vital Signs BP (!) 151/81   Pulse 80   Temp 97.8 F (36.6 C) (Oral)   Resp 13   Ht 5\' 2"  (1.575  m)   Wt 98.9 kg (218 lb)   SpO2 96%   BMI 39.87 kg/m   Physical Exam  Constitutional: She is oriented to person, place, and time. She appears well-developed.  HENT:  Head: Normocephalic.  Eyes: Conjunctivae and EOM are normal. No scleral icterus.  Neck: Neck supple. No thyromegaly present.  Cardiovascular: Normal rate and regular rhythm. Exam reveals no gallop and no friction rub.  No murmur heard. Pulmonary/Chest: No stridor. She has no wheezes. She has no rales. She exhibits no tenderness.  Abdominal: She exhibits no distension. There is no tenderness. There is no rebound.  Musculoskeletal: Normal range of motion. She exhibits no edema.  Lymphadenopathy:    She has no cervical adenopathy.  Neurological: She is oriented to person, place, and time. She exhibits  normal muscle tone. Coordination normal.  Skin: No rash noted. No erythema.  Psychiatric: She has a normal mood and affect. Her behavior is normal.     ED Treatments / Results  Labs (all labs ordered are listed, but only abnormal results are displayed) Labs Reviewed  CBC WITH DIFFERENTIAL/PLATELET - Abnormal; Notable for the following components:      Result Value   Hemoglobin 9.8 (*)    HCT 33.5 (*)    MCH 23.8 (*)    MCHC 29.3 (*)    All other components within normal limits  BASIC METABOLIC PANEL - Abnormal; Notable for the following components:   Potassium 5.9 (*)    Glucose, Bld 313 (*)    BUN 33 (*)    All other components within normal limits    EKG  EKG Interpretation  Date/Time:  Thursday January 07 2018 18:33:46 EDT Ventricular Rate:  80 PR Interval:    QRS Duration: 85 QT Interval:  427 QTC Calculation: 493 R Axis:   81 Text Interpretation:  Sinus rhythm Borderline T abnormalities, lateral leads Borderline prolonged QT interval Confirmed by Bethann Berkshire [phone]) on 01/07/2018 9:36:04 PM       Radiology No results found.  Procedures Procedures (including critical care time)  Medications  Ordered in ED Medications  sodium polystyrene (KAYEXALATE) 15 GM/60ML suspension 30 g (has no administration in time range)  sodium polystyrene (KAYEXALATE) 15 GM/60ML suspension 15 g (15 g Oral Given 01/07/18 1910)  sodium chloride 0.9 % bolus 500 mL (0 mLs Intravenous Stopped 01/07/18 2125)  furosemide (LASIX) injection 40 mg (40 mg Intravenous Given 01/07/18 1910)  sodium polystyrene (KAYEXALATE) 15 GM/60ML suspension 15 g (15 g Oral Given 01/07/18 2134)     Initial Impression / Assessment and Plan / ED Course  I have reviewed the triage vital signs and the nursing notes.  Pertinent labs & imaging results that were available during my care of the patient were reviewed by me and considered in my medical decision making (see chart for details).     Labs reviewed.  Patient is anemic has a potassium of 5.9 elevated glucose.  EKG shows no ST elevation or peak T waves.  Patient was given some Kayexalate along with Lasix and fluids.  She was also given another 30 of Kayexalate to be taken tomorrow.  She will follow-up with the nephrologist and they can recheck her potassium at that time  Final Clinical Impressions(s) / ED Diagnoses   Final diagnoses:  Hyperkalemia    ED Discharge Orders    None       Bethann Berkshire, MD 01/07/18 2139

## 2018-01-07 NOTE — Discharge Instructions (Addendum)
Follow-up with the kidney doctor tomorrow as planned.  Take that medicine Kayexalate in the morning.

## 2018-03-04 IMAGING — DX DG CHEST 1V PORT
1 series · 1 of 1 positions shown · non-contrast
Comparison: Radiographs July 20, 2017.

CLINICAL DATA: Status post coronary artery bypass graft x4.

EXAM:
PORTABLE CHEST 1 VIEW

[chest ap]
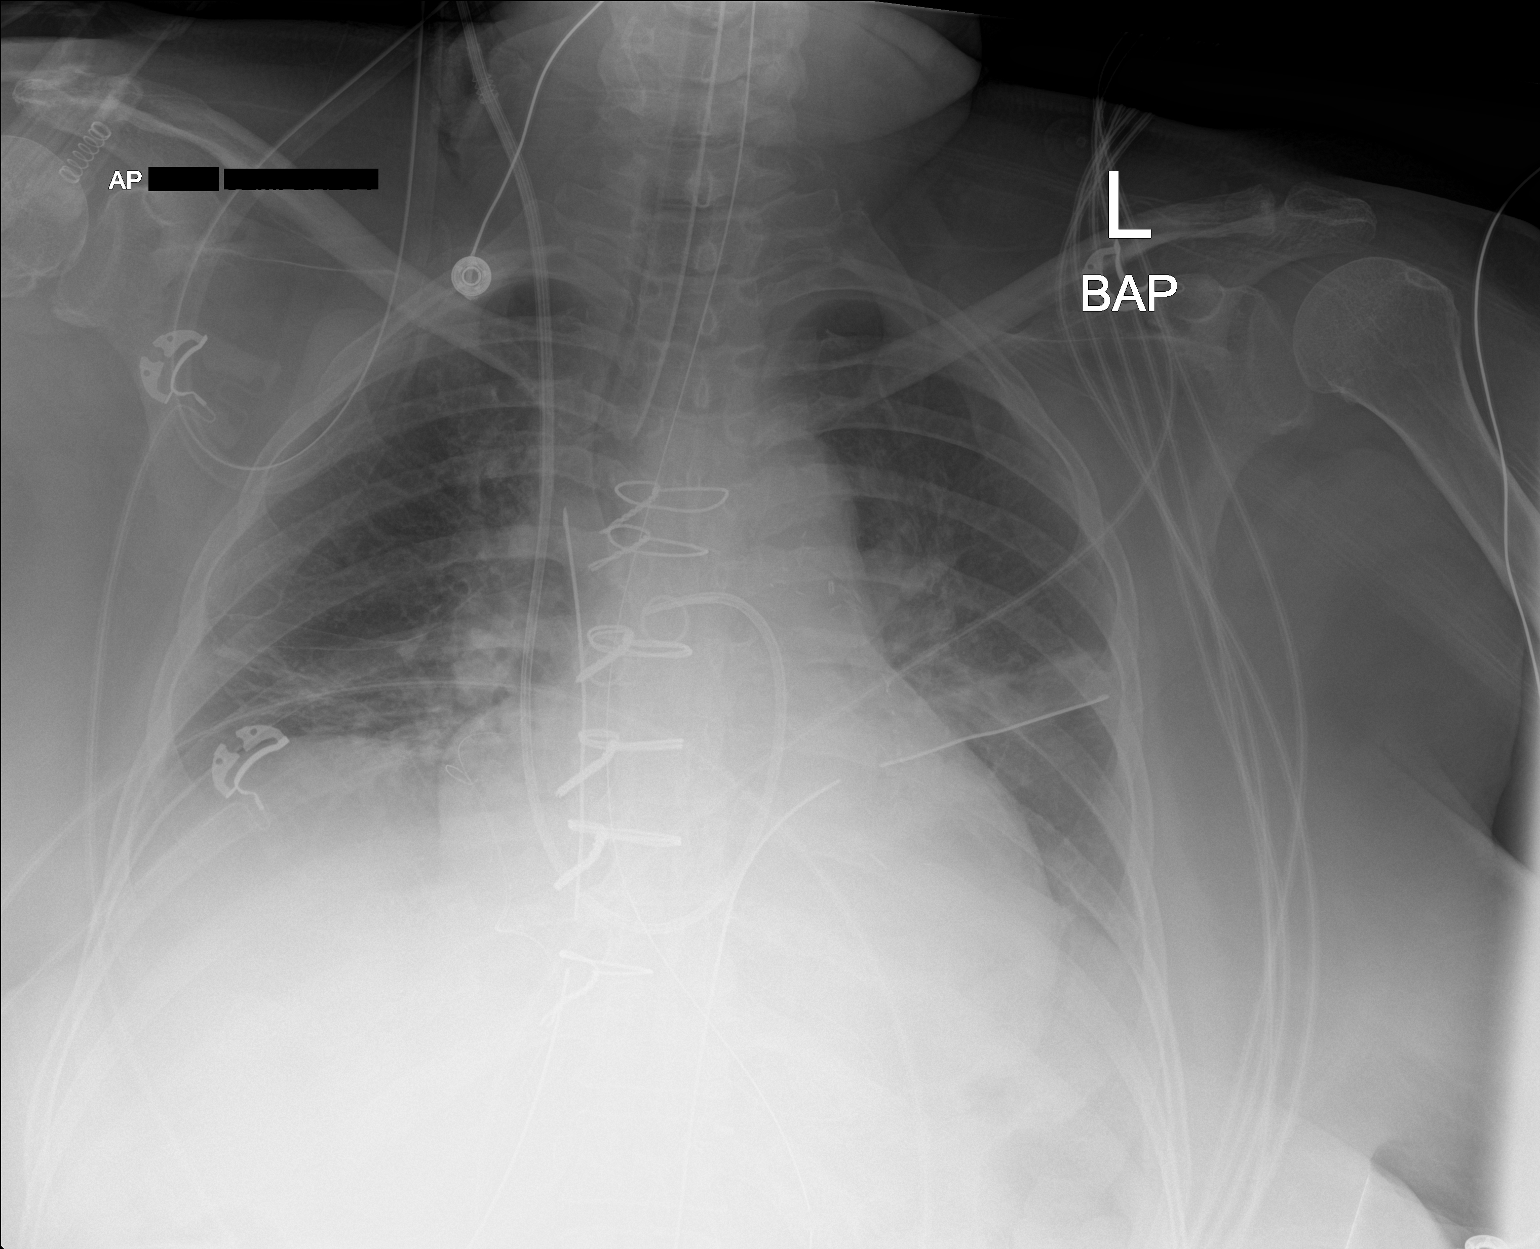

[1 of 1 positions shown; findings below may reference images not displayed]

FINDINGS: Stable cardiomediastinal silhouette. Endotracheal tube is noted over
tracheal air shadow with distal tip 4 cm above the carina.
Nasogastric tube is seen entering the stomach. Right internal
jugular catheter is noted with distal tip directed into right
pulmonary artery. Left-sided chest tube is noted without
pneumothorax. Mild bibasilar subsegmental atelectasis is noted. Bony
thorax is unremarkable.
IMPRESSION: Endotracheal and nasogastric tubes are in grossly good position.
Left-sided chest tube is noted without pneumothorax. Mild bibasilar
subsegmental atelectasis is noted.

## 2018-03-05 IMAGING — DX DG CHEST 1V PORT
1 series · 1 of 1 positions shown · non-contrast
Comparison: Chest x-ray 07/22/2017.

CLINICAL DATA: 50-year-old female status post CABG.

EXAM:
PORTABLE CHEST 1 VIEW

[chest ap]
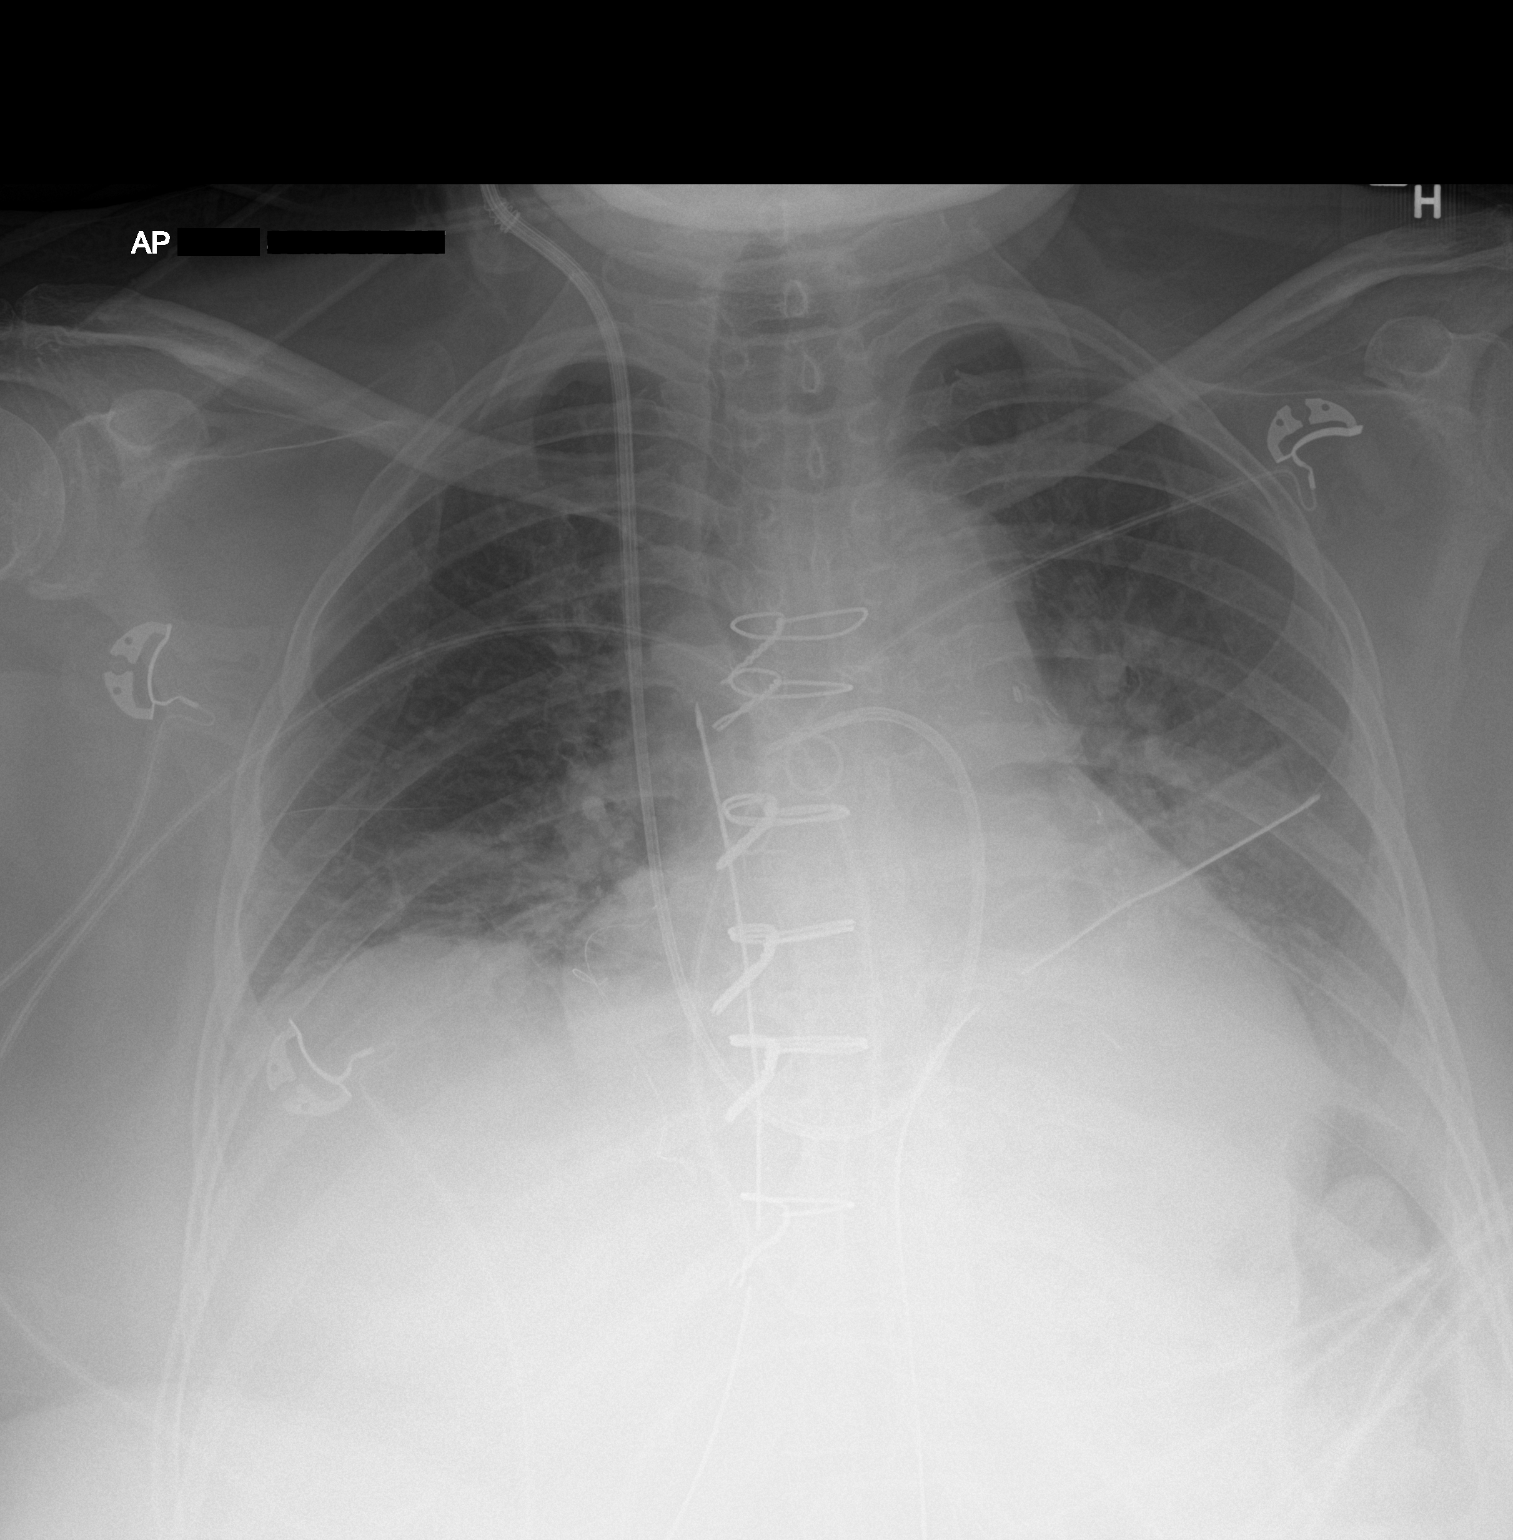

[1 of 1 positions shown; findings below may reference images not displayed]

FINDINGS: Patient has been extubated. Midline drain, presumably a pericardial
or mediastinal drain is noted, stable in position with tip
projecting to the right of the the midthoracic spine. Left-sided
chest tube in position with tip projecting over the mid left
hemithorax laterally. Right IJ Cordis through which a Swan-Ganz
catheter has been passed into the right main pulmonary artery. Lung
volumes are low. Persistent bibasilar opacities favored to reflect
areas of resolving postoperative subsegmental atelectasis with
superimposed small bilateral pleural effusions. Vascular crowding
related to low lung volumes, without frank pulmonary edema. Mild
enlargement of the cardiopericardial silhouette is unchanged. Upper
mediastinal contours are within normal limits for this postoperative
patient. Status post median sternotomy for CABG including [REDACTED] T8.
IMPRESSION: 1. Postoperative changes and support apparatus, as above.
2. Low lung volumes with resolving postoperative subsegmental
atelectasis in the lung bases bilaterally and small bilateral
pleural effusions.

## 2018-03-06 IMAGING — DX DG CHEST 1V PORT
1 series · 1 of 1 positions shown · non-contrast
Comparison: 07/23/2017 and earlier.

CLINICAL DATA: 56-year-old female status post CABG postop day two.

EXAM:
PORTABLE CHEST 1 VIEW

[chest ap]
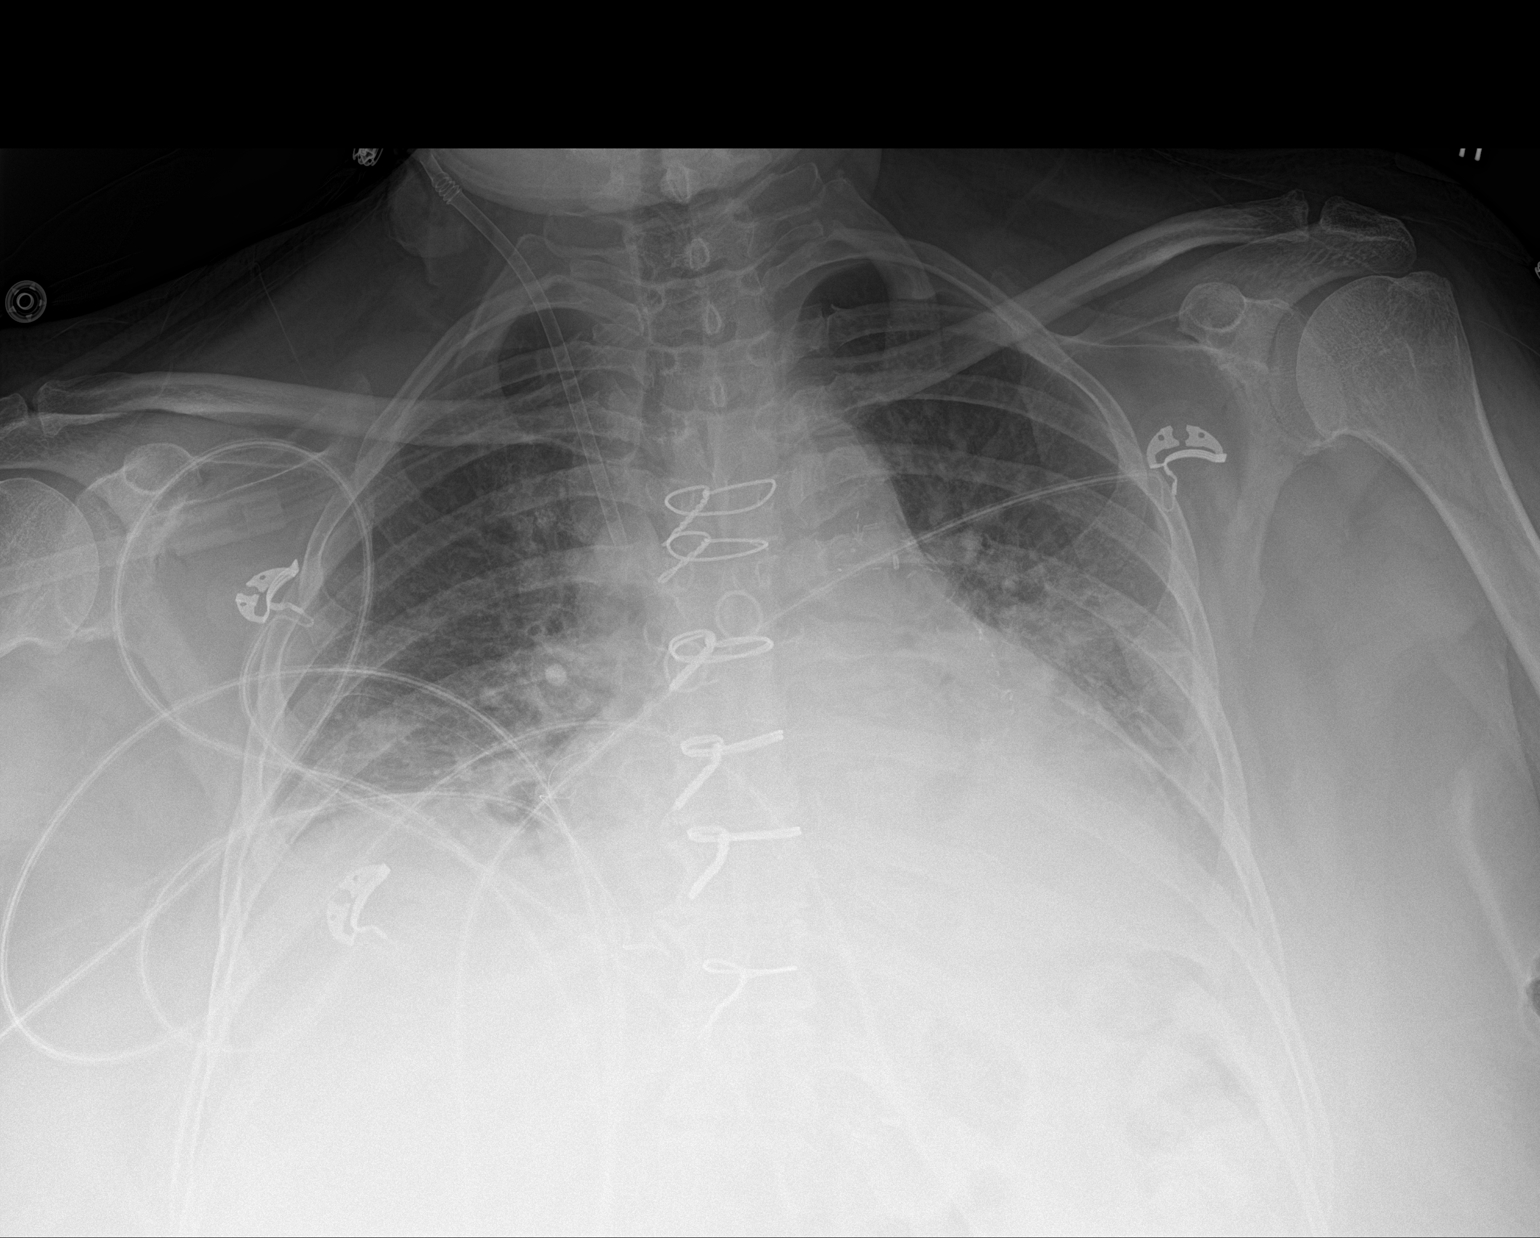

[1 of 1 positions shown; findings below may reference images not displayed]

FINDINGS: Portable AP upright view at 1584 hours. Right IJ approach Swan-Ganz
catheter has been removed, introducer sheath remains. Left chest
tube and mediastinal drain have been removed. Epicardial pacer wires
remain.

Low lung volumes. No pneumothorax. Increased pulmonary vascular
congestion. Patchy and confluent left greater than right lung base
opacity. Probable small pleural effusions. Stable cardiac size and
mediastinal contours. Negative visible bowel gas pattern.
IMPRESSION: 1. Lines and tubes removed except for the right IJ introducer sheath
and epicardial wires.
2. No pneumothorax.
3. Low lung volumes with increased vascular congestion but no overt
edema. Bibasilar atelectasis and probable small pleural effusions.

## 2018-03-08 ENCOUNTER — Other Ambulatory Visit (HOSPITAL_COMMUNITY)
Admission: RE | Admit: 2018-03-08 | Discharge: 2018-03-08 | Disposition: A | Discharge status: Home / Self Care | Payer: Medicaid Other | Source: Ambulatory Visit | Attending: Nephrology | Admitting: Nephrology

## 2018-03-08 DIAGNOSIS — E875 Hyperkalemia: Secondary | ICD-10-CM | POA: Insufficient documentation

## 2018-03-08 LAB — POTASSIUM: Potassium: 6.2 mmol/L — ABNORMAL HIGH (ref 3.5–5.1)

## 2018-03-08 IMAGING — DX DG CHEST 2V
2 series · 2 of 2 positions shown · non-contrast
Comparison: 07/25/2017

CLINICAL DATA: 3 Days Post-Op Procedure(s) (KATHARINA):CORONARY ARTERY
BYPASS GRAFTING (CABG) x four, using left internal mammary artery
and right leg greater saphenous vein harvested endoscopically

EXAM:
CHEST  2 VIEW

[chest pa]
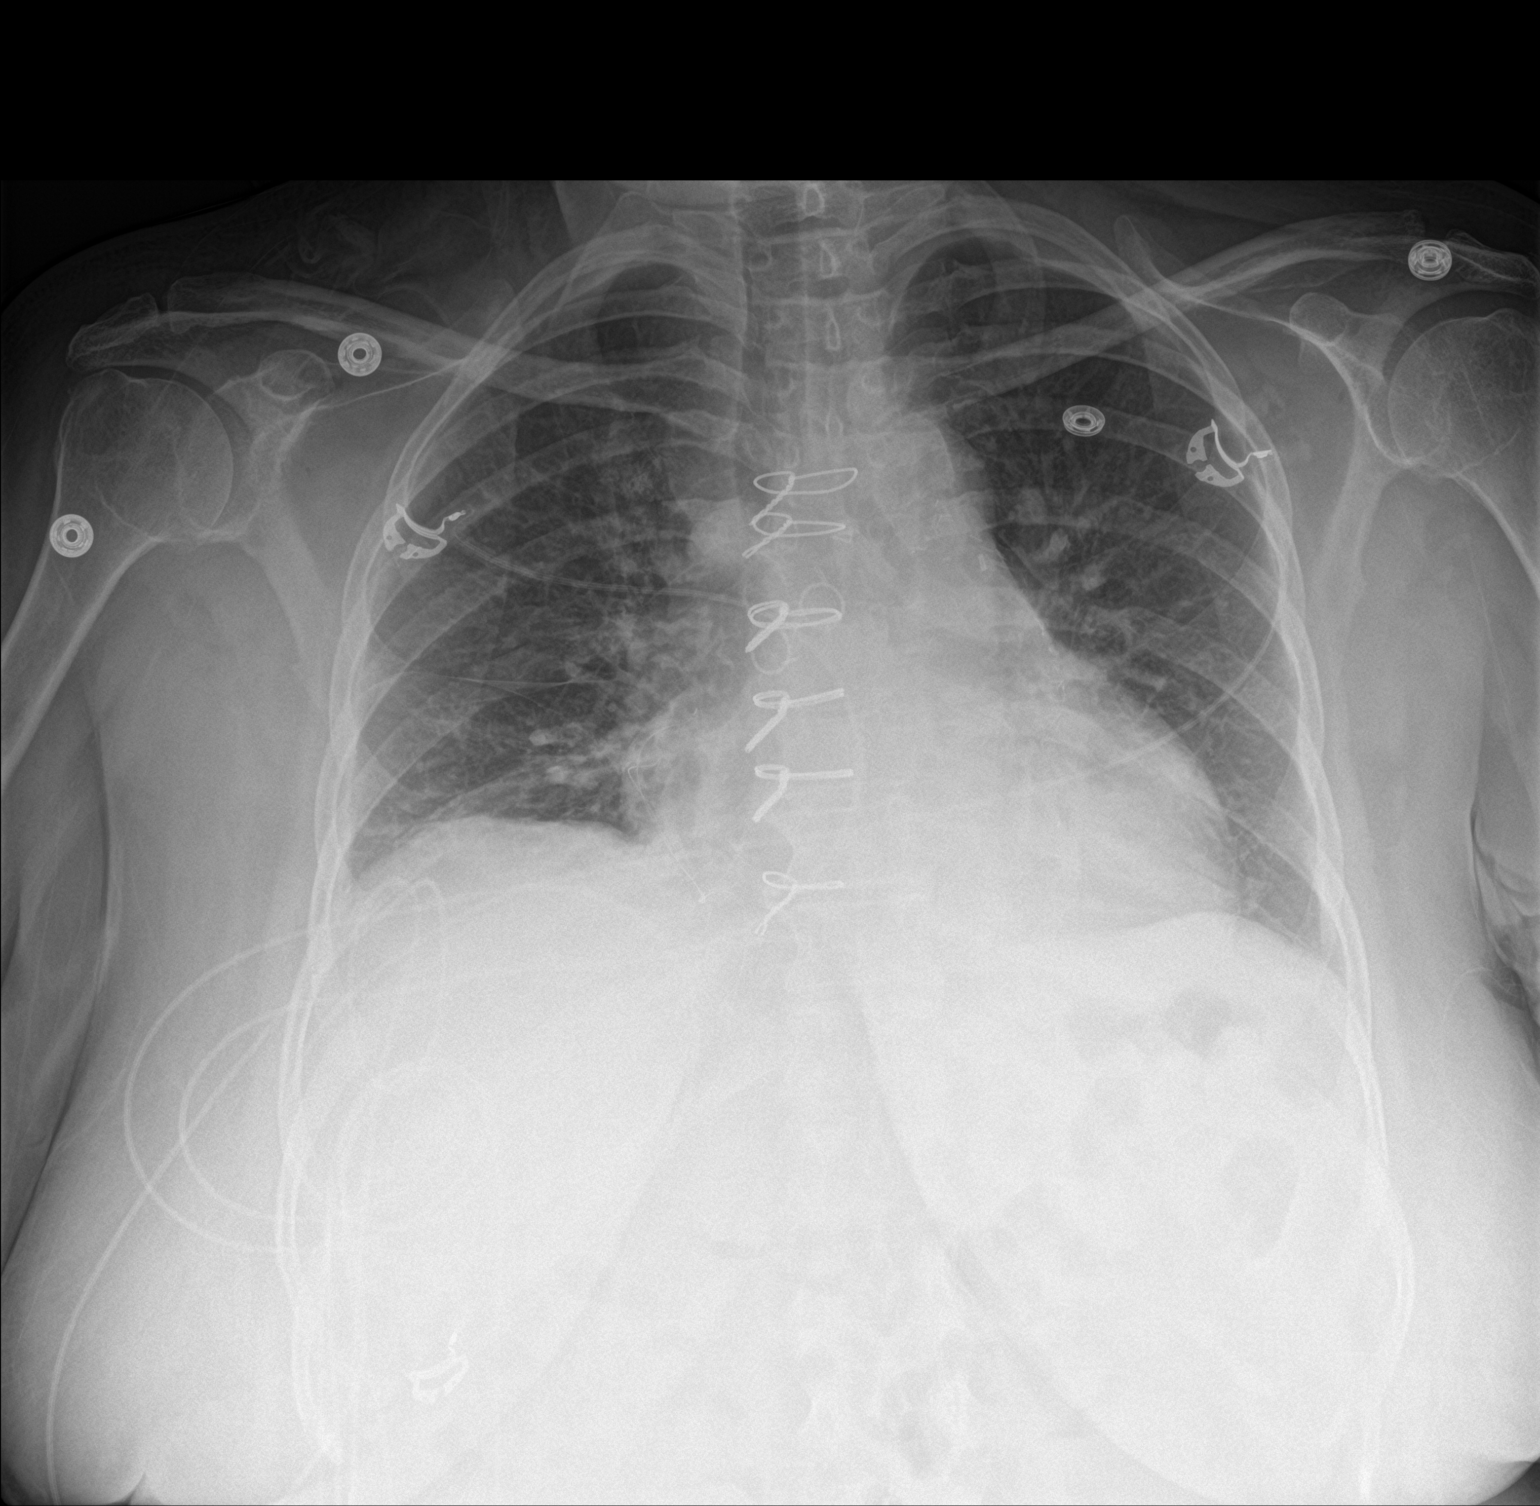

[chest lat]
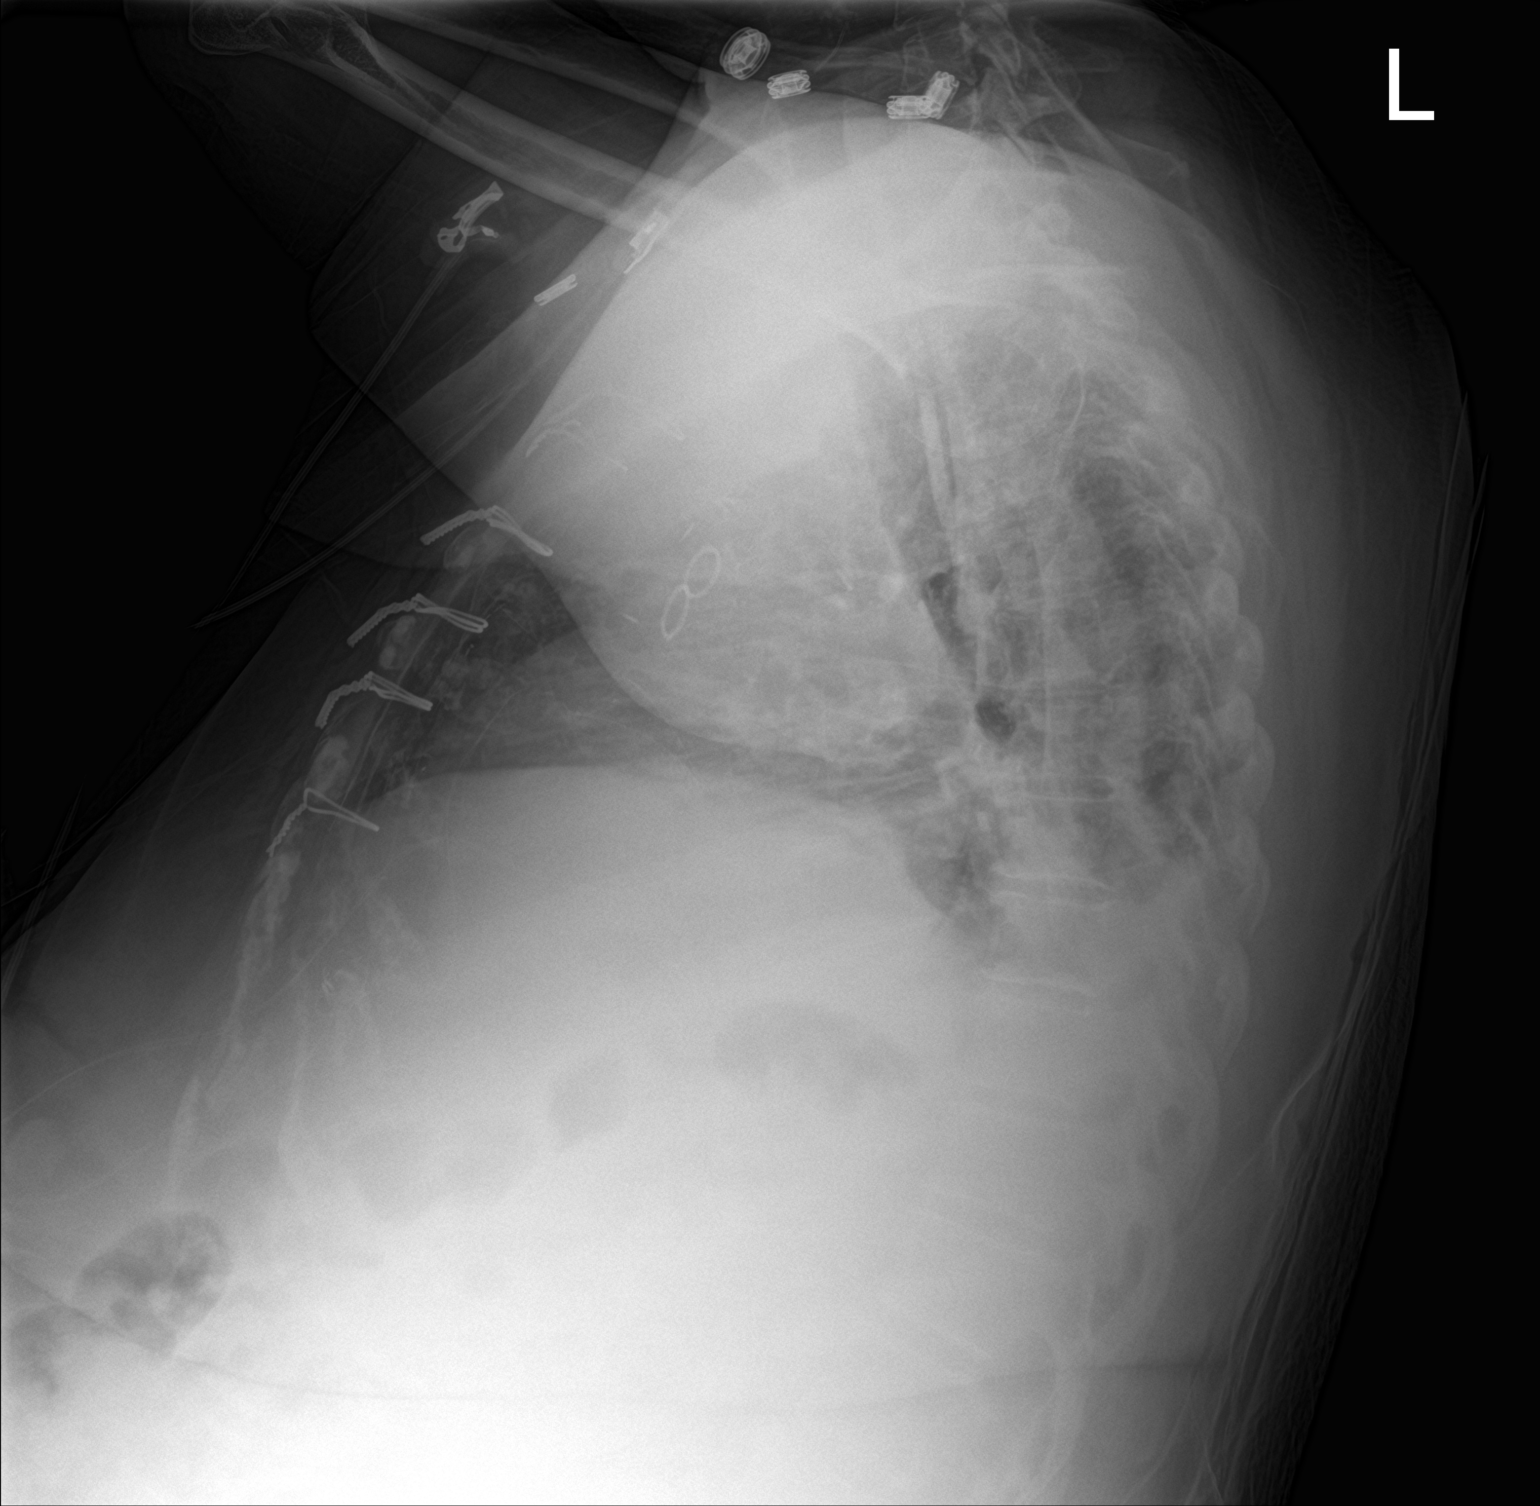

[2 of 2 positions shown; findings below may reference images not displayed]

FINDINGS: There is bilateral posterior lung base opacity consistent with small
effusions and atelectasis. No evidence of pulmonary edema. No
convincing pneumonia.

No pneumothorax.

Cardiac silhouette is mildly enlarged.  No mediastinal widening.

All lines and tubes have been removed.
IMPRESSION: 1. No acute findings or evidence of an operative complication.
2. Persistent lung base atelectasis and small pleural effusions.

## 2018-03-19 DIAGNOSIS — Z736 Limitation of activities due to disability: Secondary | ICD-10-CM

## 2018-04-27 ENCOUNTER — Other Ambulatory Visit (HOSPITAL_COMMUNITY): Payer: Self-pay | Admitting: *Deleted

## 2018-04-27 ENCOUNTER — Telehealth: Payer: Self-pay | Admitting: Cardiology

## 2018-04-27 DIAGNOSIS — Z1231 Encounter for screening mammogram for malignant neoplasm of breast: Secondary | ICD-10-CM

## 2018-04-27 NOTE — Telephone Encounter (Signed)
Pt states she hasn't taken any of her medications today because they are making her feel bad, causing her to be dizzy and have a headache all the time.

## 2018-04-27 NOTE — Telephone Encounter (Signed)
Pt walked into office and c/o being weak and that she has no energy. She reports that she takes all meds at night. When she wakes in the morning she is dizzy and unable to walk. On last night she did not take any meds and was able to walk without difficulty today. Nurse explained the importance of taking medication. Patient voiced understanding.

## 2018-04-28 NOTE — Telephone Encounter (Signed)
Called pt no answer. Left msg to call back.  

## 2018-04-28 NOTE — Telephone Encounter (Signed)
I can see her tomorrow at 140 to discuss in detail and try to figure out whats going on   J BrancH MD

## 2018-04-29 ENCOUNTER — Ambulatory Visit (HOSPITAL_COMMUNITY)
Admission: RE | Admit: 2018-04-29 | Discharge: 2018-04-29 | Disposition: A | Discharge status: Home / Self Care | Payer: Medicaid Other | Source: Ambulatory Visit | Attending: *Deleted | Admitting: *Deleted

## 2018-04-29 ENCOUNTER — Ambulatory Visit (INDEPENDENT_AMBULATORY_CARE_PROVIDER_SITE_OTHER): Payer: Medicaid Other | Admitting: Cardiology

## 2018-04-29 ENCOUNTER — Other Ambulatory Visit (HOSPITAL_COMMUNITY)
Admission: RE | Admit: 2018-04-29 | Discharge: 2018-04-29 | Disposition: A | Discharge status: Home / Self Care | Payer: Medicaid Other | Source: Ambulatory Visit | Attending: Cardiology | Admitting: Cardiology

## 2018-04-29 ENCOUNTER — Encounter (HOSPITAL_COMMUNITY): Payer: Self-pay

## 2018-04-29 ENCOUNTER — Ambulatory Visit (HOSPITAL_COMMUNITY): Payer: Self-pay

## 2018-04-29 ENCOUNTER — Encounter: Payer: Self-pay | Admitting: Cardiology

## 2018-04-29 VITALS — BP 90/62 | HR 80 | Ht 63.0 in | Wt 216.0 lb

## 2018-04-29 DIAGNOSIS — I2510 Atherosclerotic heart disease of native coronary artery without angina pectoris: Secondary | ICD-10-CM | POA: Diagnosis not present

## 2018-04-29 DIAGNOSIS — Z1231 Encounter for screening mammogram for malignant neoplasm of breast: Secondary | ICD-10-CM | POA: Diagnosis not present

## 2018-04-29 DIAGNOSIS — M7989 Other specified soft tissue disorders: Secondary | ICD-10-CM

## 2018-04-29 DIAGNOSIS — I272 Pulmonary hypertension, unspecified: Secondary | ICD-10-CM | POA: Diagnosis not present

## 2018-04-29 LAB — BASIC METABOLIC PANEL
ANION GAP: 9 (ref 5–15)
BUN: 39 mg/dL — ABNORMAL HIGH (ref 6–20)
CO2: 30 mmol/L (ref 22–32)
Calcium: 8.8 mg/dL — ABNORMAL LOW (ref 8.9–10.3)
Chloride: 97 mmol/L — ABNORMAL LOW (ref 98–111)
Creatinine, Ser: 1.77 mg/dL — ABNORMAL HIGH (ref 0.44–1.00)
GFR calc Af Amer: 36 mL/min — ABNORMAL LOW (ref >60)
GFR, EST NON AFRICAN AMERICAN: 31 mL/min — AB (ref >60)
GLUCOSE: 260 mg/dL — AB (ref 70–99)
Potassium: 4.4 mmol/L (ref 3.5–5.1)
SODIUM: 136 mmol/L (ref 135–145)

## 2018-04-29 LAB — CBC
HCT: 37 % (ref 36.0–46.0)
Hemoglobin: 11.5 g/dL — ABNORMAL LOW (ref 12.0–15.0)
MCH: 24.9 pg — ABNORMAL LOW (ref 26.0–34.0)
MCHC: 31.1 g/dL (ref 30.0–36.0)
MCV: 80.1 fL (ref 78.0–100.0)
PLATELETS: 298 10*3/uL (ref 150–400)
RBC: 4.62 MIL/uL (ref 3.87–5.11)
RDW: 15.1 % (ref 11.5–15.5)
WBC: 11.3 10*3/uL — AB (ref 4.0–10.5)

## 2018-04-29 LAB — LIPID PANEL
Cholesterol: 156 mg/dL (ref 0–200)
HDL: 47 mg/dL (ref >40)
LDL CALC: 84 mg/dL (ref 0–99)
Total CHOL/HDL Ratio: 3.3 RATIO
Triglycerides: 123 mg/dL (ref <150)
VLDL: 25 mg/dL (ref 0–40)

## 2018-04-29 LAB — HEMOGLOBIN A1C
HEMOGLOBIN A1C: 14 % — AB (ref 4.8–5.6)
Mean Plasma Glucose: 355.1 mg/dL

## 2018-04-29 LAB — TSH: TSH: 1.984 u[IU]/mL (ref 0.350–4.500)

## 2018-04-29 LAB — MAGNESIUM: Magnesium: 2.3 mg/dL (ref 1.7–2.4)

## 2018-04-29 MED ORDER — MIDODRINE HCL 5 MG PO TABS: 5.0000 mg | ORAL_TABLET | Freq: Three times a day (TID) | ORAL | 1 refills | Status: DC

## 2018-04-29 NOTE — Patient Instructions (Signed)
Your physician recommends that you schedule a follow-up appointment in: 2 MONTHS WITH DR [facility]  Your physician has recommended you make the following change in your medication:   INCREASE MIDODRINE 5 MG THREE TIMES DAILY  WE HAVE GIVEN YOU RX FOR COMPRESSION STOCKINGS  Your physician has requested that you have a lower or upper extremity venous duplex. This test is an ultrasound of the veins in the legs or arms. It looks at venous blood flow that carries blood from the heart to the legs or arms. Allow one hour for a Lower Venous exam. Allow thirty minutes for an Upper Venous exam. There are no restrictions or special instructions.  Your physician recommends that you return for lab work CBC/BMP/TSH/HGBA1C/LIPIDS/MG   Thank you for choosing [facility]!!

## 2018-04-29 NOTE — Progress Notes (Signed)
Clinical Summary Ms. Prentiss is a 57 y.o.female seen today for follow up of the following medical problems.   1. CAD/ICM - admitted 06/2017 with chest pain. Trop to 0.3, EKG with inferior and lateral precordial ST/T changes - 05/2017 echo LVEF 45-50%, grade II diastolic dysfunction - cath 06/2017 - s/p CABG10/2/18  LIMA-LAD, SVG-diag, SVG-OM2, SVG-PDA    - no ACE/ARB due to recurrent issues with hyperkalemia - no recent chest pain or SOB.   2. Pulmonary HTN - 06/2017 mean PA 46, PCWP 31 - combined pre and postcapillary pulmonary HTN, suspect chronically elevated left sided pressures with secondary pulmonary remodeling.   - no recent symptoms.   3. Mitral regurgitation - 06/25/17 TEE mod MR - intraop TEE  07/22/17 mild MR  - no recent symptoms.   4. Right leg swelling - right leg swelling since recent CABG.  - appears she was on lasix for a period by another provider.   5. Hypotension - low bp's after CABG, was discharged on midodrine - at last visit we lowered the dose to 5mg  bid - since that time increased dizziness.    6 . Dizziness - occurs when waking up in the morning. Wakes up 3AM to go to bathrrom, feels very dizy - during the day dizziness with standing. - drinks water daily.Drinks 5-6 glasses daily - gaba 400 8 months ago - on lasix x 3 months bid.      Past Medical History:  Diagnosis Date  . Diabetic neuropathy (HCC)   . Dyspnea   . Dysrhythmia   . Headache   . Hyperlipidemia   . Hypertension   . Type 2 diabetes mellitus (HCC)      No Known Allergies   Current Outpatient Medications  Medication Sig Dispense Refill  . acetaminophen (TYLENOL) 500 MG tablet Take 2 tablets (1,000 mg total) by mouth every 6 (six) hours as needed. 30 tablet 0  . aspirin EC 81 MG tablet Take 81 mg by mouth daily.    . Dulaglutide (TRULICITY) 0.75 MG/0.5ML SOPN Inject 0.75 mg into the skin every 7 (seven) days. tuesdays    . gabapentin (NEURONTIN) 400 MG  capsule Take 400 mg by mouth 3 (three) times daily as needed (foot pain).     . hydrochlorothiazide (HYDRODIURIL) 25 MG tablet Take 25 mg by mouth daily.    . insulin detemir (LEVEMIR) 100 UNIT/ML injection Inject 40 Units into the skin 2 (two) times daily.     Marland Kitchen loratadine (CLARITIN) 10 MG tablet Take 10 mg by mouth daily.    . metoprolol succinate (TOPROL-XL) 25 MG 24 hr tablet Take 0.5 tablets (12.5 mg total) by mouth daily. 30 tablet 1  . midodrine (PROAMATINE) 5 MG tablet Take 5 mg by mouth 2 (two) times daily with a meal.    . rosuvastatin (CRESTOR) 20 MG tablet Take 1 tablet (20 mg total) by mouth at bedtime. 90 tablet 3  . silver sulfADIAZINE (SILVADENE) 1 % cream Apply 1 application topically daily.    . vitamin C (ASCORBIC ACID) 500 MG tablet Take 500 mg by mouth daily.     No current facility-administered medications for this visit.      Past Surgical History:  Procedure Laterality Date  . CORONARY ARTERY BYPASS GRAFT N/A 07/22/2017   Procedure: CORONARY ARTERY BYPASS GRAFTING (CABG) x four, using left internal mammary artery and right leg greater saphenous vein harvested endoscopically;  Surgeon: Loreli Slot, MD;  Location: [facility] OR;  Service:  Open Heart Surgery;  Laterality: N/A;  . RIGHT/LEFT HEART CATH AND CORONARY ANGIOGRAPHY N/A 06/26/2017   Procedure: RIGHT/LEFT HEART CATH AND CORONARY ANGIOGRAPHY;  Surgeon: Marykay Lex, MD;  Location: [facility] INVASIVE CV LAB;  Service: Cardiovascular;  Laterality: N/A;  . TEE WITHOUT CARDIOVERSION N/A 06/25/2017   Procedure: TRANSESOPHAGEAL ECHOCARDIOGRAM (TEE);  Surgeon: Thurmon Fair, MD;  Location: [facility] ENDOSCOPY;  Service: Cardiovascular;  Laterality: N/A;  . TEE WITHOUT CARDIOVERSION N/A 07/22/2017   Procedure: TRANSESOPHAGEAL ECHOCARDIOGRAM (TEE);  Surgeon: Loreli Slot, MD;  Location: [facility] OR;  Service: Open Heart Surgery;  Laterality: N/A;  . TUBAL LIGATION       No Known Allergies    Family History  Problem  Relation Age of Onset  . Diabetes type II Other   . Hypertension Other      Social History Ms. Arai reports that she has never smoked. She has never used smokeless tobacco. Ms. Ramnauth reports that she does not drink alcohol.   Review of Systems CONSTITUTIONAL: No weight loss, fever, chills, weakness or fatigue.  HEENT: Eyes: No visual loss, blurred vision, double vision or yellow sclerae.No hearing loss, sneezing, congestion, runny nose or sore throat.  SKIN: No rash or itching.  CARDIOVASCULAR: per hpi RESPIRATORY: No shortness of breath, cough or sputum.  GASTROINTESTINAL: No anorexia, nausea, vomiting or diarrhea. No abdominal pain or blood.  GENITOURINARY: No burning on urination, no polyuria NEUROLOGICAL:per hpi MUSCULOSKELETAL: No muscle, back pain, joint pain or stiffness.  LYMPHATICS: No enlarged nodes. No history of splenectomy.  PSYCHIATRIC: No history of depression or anxiety.  ENDOCRINOLOGIC: No reports of sweating, cold or heat intolerance. No polyuria or polydipsia.  Marland Kitchen   Physical Examination Vitals:   04/29/18 1322  BP: 90/62  Pulse: 80  SpO2: 95%   Vitals:   04/29/18 1322  Weight: 216 lb (98 kg)  Height: 5\' 3"  (1.6 m)    Gen: resting comfortably, no acute distress HEENT: no scleral icterus, pupils equal round and reactive, no palptable cervical adenopathy,  CV: RRR, no m/r/g, no jvd Resp: Clear to auscultation bilaterally GI: abdomen is soft, non-tender, non-distended, normal bowel sounds, no hepatosplenomegaly MSK: extremities are warm, no edema.  Skin: warm, no rash Neuro:  no focal deficits Psych: appropriate affect   Diagnostic Studies 05/2017 echo  Study Conclusions  - Left ventricle: The cavity size was normal. Wall thickness was at the upper limits of normal. Systolic function was mildly reduced. The estimated ejection fraction was in the range of 45% to 50%. Diffuse hypokinesis. Features are consistent with a  pseudonormal left ventricular filling pattern, with concomitant abnormal relaxation and increased filling pressure (grade 2 diastolic dysfunction). - Mitral valve: There was moderate to severe regurgitation. - Left atrium: The atrium was moderately dilated. - Right atrium: Central venous pressure (est): 3 mm Hg. - Atrial septum: No defect or patent foramen ovale was identified. - Tricuspid valve: There was mild regurgitation. - Pulmonary arteries: Systolic pressure was severely increased. PA peak pressure: 62 mm Hg (S). - Pericardium, extracardiac: There was no pericardial effusion.  Impressions:  - Upper normal LV wall thickness with LVEF approximately 45-50% and diffuse hypokinesis. Grade 2 diastolic dysfunction with increased filling pressures. Moderate left atrial enlargement. There is moderate to severe mitral regurgitation with eccentric jet. Mechanism of mitral regurgitation is not clear, would suggest TEE for further evaluation. Mild tricuspid regurgitation with evidence of severe pulmonary hypertension and PASP 62 mmHg.  06/2017 TEE Study Conclusions  - Left ventricle: Systolic function  was mildly reduced. The estimated ejection fraction was in the range of 45% to 50%. Mild diffuse hypokinesis with no identifiable regional variations. - Aortic valve: No evidence of vegetation. - Mitral valve: Thickening, consistent with rheumatic disease. No evidence of vegetation. There was moderate regurgitation directed centrally, eccentrically, and anteriorly. - Left atrium: The atrium was moderately dilated. No evidence of thrombus in the atrial cavity or appendage. There was spontaneous echo contrast (&quot;smoke&quot;). - Right atrium: No evidence of thrombus in the atrial cavity or appendage. No evidence of thrombus in the atrial cavity or appendage. - Atrial septum: No defect or patent foramen ovale was identified. - Tricuspid valve: No  evidence of vegetation. There was mild-moderate regurgitation directed centrally. - Pulmonic valve: No evidence of vegetation. No evidence of vegetation. - Pulmonary arteries: Systolic pressure was moderately increased. PA peak pressure: 59 mm Hg (S).  Impressions:  - Although the MR jet is eccentric and could be underestimated by color Doppler, there is consistent evidence for moderate severity based on continuity equation-derived regurgitant fraction, PISA-derived effective regurgitant orifice area and absence of pulmonary vein systolic flow reversal.  06/2017 RHC/LHC  Prox RCA lesion, 70 %stenosed. Mid RCA lesion, 40 %stenosed. Dist RCA lesion, 40 %stenosed.  Ost 2nd Diag to 2nd Diag lesion, 90 %stenosed.  Cx-OM1 bifurcation: Prox Cx lesion, 80 %stenosed. Mid Cx lesion, 100 %stenosed. Ost 1st Mrg lesion, 70 %stenosed.  There is moderate left ventricular systolic dysfunction. The left ventricular ejection fraction is 35-45% by visual estimate.  LV end diastolic pressure is severely elevated.  There is moderate (3+) mitral regurgitation by LV Gram, but Lage V wave on PCWP would suggest Severe MR.  There is no aortic valve stenosis.  Hemodynamic findings consistent with severe pulmonary hypertension. Mostly Secondary to MR & elevated LVEDP.  Severe 2 Vessel disease (moderate to severe RCA and one or percent mid circumflex-OM occlusion) along with severe diffusely diseased very small caliber Diag branches and mild diffuse disease throughout the LAD.  Most likely at least moderate-severe mitral regurgitation based on large V wave.  Recommend CV surgery consultation to determine the best course of action would treating the patient's mitral valve and CAD.   Assessment and Plan  1. CAD - no recent symptoms, continue current meds  2. Pulmonary hypertension - evidence of pre and postcapillary pulm HTN by cath, suspect due to chronic left sided heart disease  with secondary pulmonary vasculature remodeling - no recent symptoms. Continue management of left sided heart disease.   3. Hypotension - low bp's after CABG, was discharged on midodrine. We lowered dose last visit, but since that time increased dizziness - increase midodrine back to 5mg  tid.   4. Leg swelling - right leg pain and swelling, order LE venous US to rule out DVT. Negative study last year but worsening pain in this area.  - swelling may primarily be due prior CABG. Has been on diuretics by another provider. She may be getting dry from diuretics, will check labs. Orthostatics negative today.  - order compression stockings.     Check annual labs, she does not have pcp      Antoine Poche, M.D.

## 2018-04-29 NOTE — Telephone Encounter (Signed)
Pt notified and voiced understanding 

## 2018-05-03 ENCOUNTER — Ambulatory Visit (HOSPITAL_COMMUNITY)
Admission: RE | Admit: 2018-05-03 | Discharge: 2018-05-03 | Disposition: A | Discharge status: Home / Self Care | Payer: Medicaid Other | Source: Ambulatory Visit | Attending: Cardiology | Admitting: Cardiology

## 2018-05-03 DIAGNOSIS — M7989 Other specified soft tissue disorders: Secondary | ICD-10-CM

## 2018-05-04 ENCOUNTER — Telehealth: Payer: Self-pay

## 2018-05-04 NOTE — Telephone Encounter (Signed)
-----   Message from Jonathan F Branch, MD sent at 05/04/2018 11:37 AM EDT ----- US is negative for any blood clot  J branch MD 

## 2018-05-04 NOTE — Telephone Encounter (Signed)
Called pt. No answer. Left message for pt to return call.  

## 2018-05-05 ENCOUNTER — Telehealth: Payer: Self-pay

## 2018-05-05 NOTE — Telephone Encounter (Signed)
Called pt. No answer. No voicemail set up. Mailed pt letter.

## 2018-05-05 NOTE — Telephone Encounter (Signed)
-----   Message from Antoine Poche, MD sent at 05/04/2018 11:37 AM EDT ----- [place] is negative for any blood clot  J branch MD

## 2018-05-05 NOTE — Telephone Encounter (Signed)
Spoke with pt. Informed her of test results. She voiced understanding.

## 2018-05-11 ENCOUNTER — Telehealth: Payer: Self-pay

## 2018-05-11 DIAGNOSIS — Z79899 Other long term (current) drug therapy: Secondary | ICD-10-CM

## 2018-05-11 NOTE — Telephone Encounter (Signed)
Please call pt back concerning medication

## 2018-05-11 NOTE — Telephone Encounter (Signed)
-----   Message from Antoine Poche, MD sent at 05/10/2018 12:34 PM EDT ----- Labs show she is dehydrated. I would stop lasix. Her leg swelling is not due to any issues with the total amount of fluid in her body being elevated, and more related to leaky veins which does not respond to lasix. Also diabetes is doing very poorly, do we have any information on pcp's that are accepting medicaid patients, this has been an issue for her findings a pcp. Repeat BMET/Mg in 2 weeks.    J BrancH MD

## 2018-05-11 NOTE — Telephone Encounter (Signed)
Pt wanted to clarify she is not on lasix

## 2018-05-11 NOTE — Telephone Encounter (Signed)
Pt stated she is not on lasix.She has been to the health dept before for care and planned to make an apt with them.I mailed her her lab slip

## 2018-05-12 NOTE — Telephone Encounter (Signed)
If I remember correctly she had a lasix bottle with her at our appointment but mistook it as being something else. Can she have a nursing visit to bring all her pill bottles with her and clarify. Pretty important as her kidney function is quite decreased and want to be sure she is not taking by mistake.   Dominga Ferry MD

## 2018-05-13 ENCOUNTER — Ambulatory Visit: Payer: Self-pay | Admitting: Cardiology

## 2018-05-13 NOTE — Telephone Encounter (Signed)
Patient came into office and she did have a bottle of lasix with her and was taking.I offered to dispose of it at the pharmacy and she agreed

## 2018-05-21 ENCOUNTER — Ambulatory Visit: Payer: Self-pay | Admitting: Cardiology

## 2018-05-25 ENCOUNTER — Other Ambulatory Visit (HOSPITAL_COMMUNITY)
Admission: RE | Admit: 2018-05-25 | Discharge: 2018-05-25 | Disposition: A | Discharge status: Home / Self Care | Payer: Medicaid Other | Source: Ambulatory Visit | Attending: Physician Assistant | Admitting: Physician Assistant

## 2018-05-25 DIAGNOSIS — R69 Illness, unspecified: Secondary | ICD-10-CM | POA: Insufficient documentation

## 2018-05-28 ENCOUNTER — Other Ambulatory Visit: Payer: Self-pay | Admitting: Cardiology

## 2018-05-29 LAB — BASIC METABOLIC PANEL
BUN / CREAT RATIO: 23 (ref 9–23)
BUN: 27 mg/dL — ABNORMAL HIGH (ref 6–24)
CHLORIDE: 100 mmol/L (ref 96–106)
CO2: 23 mmol/L (ref 20–29)
Calcium: 9.7 mg/dL (ref 8.7–10.2)
Creatinine, Ser: 1.17 mg/dL — ABNORMAL HIGH (ref 0.57–1.00)
GFR calc non Af Amer: 52 mL/min/{1.73_m2} — ABNORMAL LOW (ref >59)
GFR, EST AFRICAN AMERICAN: 60 mL/min/{1.73_m2} (ref >59)
GLUCOSE: 269 mg/dL — AB (ref 65–99)
Potassium: 5.4 mmol/L — ABNORMAL HIGH (ref 3.5–5.2)
SODIUM: 137 mmol/L (ref 134–144)

## 2018-05-29 LAB — SPECIMEN STATUS REPORT

## 2018-05-29 LAB — MAGNESIUM: MAGNESIUM: 2.4 mg/dL — AB (ref 1.6–2.3)

## 2018-06-01 ENCOUNTER — Emergency Department (HOSPITAL_COMMUNITY): Payer: Medicaid Other

## 2018-06-01 ENCOUNTER — Emergency Department (HOSPITAL_COMMUNITY)
Admission: EM | Admit: 2018-06-01 | Discharge: 2018-06-01 | Disposition: A | Discharge status: Home / Self Care | Payer: Medicaid Other | Attending: Emergency Medicine | Admitting: Emergency Medicine

## 2018-06-01 ENCOUNTER — Encounter (HOSPITAL_COMMUNITY): Payer: Self-pay | Admitting: *Deleted

## 2018-06-01 ENCOUNTER — Other Ambulatory Visit: Payer: Self-pay

## 2018-06-01 DIAGNOSIS — S99921A Unspecified injury of right foot, initial encounter: Secondary | ICD-10-CM | POA: Diagnosis present

## 2018-06-01 DIAGNOSIS — N3000 Acute cystitis without hematuria: Secondary | ICD-10-CM

## 2018-06-01 DIAGNOSIS — M14671 Charcot's joint, right ankle and foot: Secondary | ICD-10-CM | POA: Insufficient documentation

## 2018-06-01 DIAGNOSIS — I2510 Atherosclerotic heart disease of native coronary artery without angina pectoris: Secondary | ICD-10-CM | POA: Diagnosis not present

## 2018-06-01 DIAGNOSIS — Z7982 Long term (current) use of aspirin: Secondary | ICD-10-CM | POA: Diagnosis not present

## 2018-06-01 DIAGNOSIS — E119 Type 2 diabetes mellitus without complications: Secondary | ICD-10-CM | POA: Diagnosis not present

## 2018-06-01 DIAGNOSIS — Y929 Unspecified place or not applicable: Secondary | ICD-10-CM | POA: Diagnosis not present

## 2018-06-01 DIAGNOSIS — Z794 Long term (current) use of insulin: Secondary | ICD-10-CM | POA: Insufficient documentation

## 2018-06-01 DIAGNOSIS — X58XXXA Exposure to other specified factors, initial encounter: Secondary | ICD-10-CM | POA: Insufficient documentation

## 2018-06-01 DIAGNOSIS — Y999 Unspecified external cause status: Secondary | ICD-10-CM | POA: Diagnosis not present

## 2018-06-01 DIAGNOSIS — S92254A Nondisplaced fracture of navicular [scaphoid] of right foot, initial encounter for closed fracture: Secondary | ICD-10-CM | POA: Diagnosis not present

## 2018-06-01 DIAGNOSIS — N183 Chronic kidney disease, stage 3 (moderate): Secondary | ICD-10-CM | POA: Diagnosis not present

## 2018-06-01 DIAGNOSIS — I129 Hypertensive chronic kidney disease with stage 1 through stage 4 chronic kidney disease, or unspecified chronic kidney disease: Secondary | ICD-10-CM | POA: Insufficient documentation

## 2018-06-01 DIAGNOSIS — Y939 Activity, unspecified: Secondary | ICD-10-CM | POA: Insufficient documentation

## 2018-06-01 LAB — CBC WITH DIFFERENTIAL/PLATELET
Basophils Absolute: 0.1 10*3/uL (ref 0.0–0.1)
Basophils Relative: 1 %
EOS ABS: 0.4 10*3/uL (ref 0.0–0.7)
Eosinophils Relative: 5 %
HCT: 36.3 % (ref 36.0–46.0)
Hemoglobin: 11.4 g/dL — ABNORMAL LOW (ref 12.0–15.0)
LYMPHS ABS: 2.4 10*3/uL (ref 0.7–4.0)
Lymphocytes Relative: 32 %
MCH: 25.2 pg — AB (ref 26.0–34.0)
MCHC: 31.4 g/dL (ref 30.0–36.0)
MCV: 80.1 fL (ref 78.0–100.0)
MONOS PCT: 10 %
Monocytes Absolute: 0.8 10*3/uL (ref 0.1–1.0)
Neutro Abs: 3.9 10*3/uL (ref 1.7–7.7)
Neutrophils Relative %: 52 %
PLATELETS: 296 10*3/uL (ref 150–400)
RBC: 4.53 MIL/uL (ref 3.87–5.11)
RDW: 15.1 % (ref 11.5–15.5)
WBC: 7.5 10*3/uL (ref 4.0–10.5)

## 2018-06-01 LAB — COMPREHENSIVE METABOLIC PANEL
ALT: 16 U/L (ref 0–44)
ANION GAP: 5 (ref 5–15)
AST: 14 U/L — ABNORMAL LOW (ref 15–41)
Albumin: 3.4 g/dL — ABNORMAL LOW (ref 3.5–5.0)
Alkaline Phosphatase: 90 U/L (ref 38–126)
BUN: 38 mg/dL — ABNORMAL HIGH (ref 6–20)
CHLORIDE: 103 mmol/L (ref 98–111)
CO2: 28 mmol/L (ref 22–32)
Calcium: 8.7 mg/dL — ABNORMAL LOW (ref 8.9–10.3)
Creatinine, Ser: 1.46 mg/dL — ABNORMAL HIGH (ref 0.44–1.00)
GFR calc non Af Amer: 39 mL/min — ABNORMAL LOW (ref >60)
GFR, EST AFRICAN AMERICAN: 45 mL/min — AB (ref >60)
Glucose, Bld: 263 mg/dL — ABNORMAL HIGH (ref 70–99)
Potassium: 5 mmol/L (ref 3.5–5.1)
SODIUM: 136 mmol/L (ref 135–145)
Total Bilirubin: 0.5 mg/dL (ref 0.3–1.2)
Total Protein: 7.6 g/dL (ref 6.5–8.1)

## 2018-06-01 LAB — CBG MONITORING, ED: Glucose-Capillary: 252 mg/dL — ABNORMAL HIGH (ref 70–99)

## 2018-06-01 LAB — URINALYSIS, ROUTINE W REFLEX MICROSCOPIC
Bilirubin Urine: NEGATIVE
Hgb urine dipstick: NEGATIVE
KETONES UR: NEGATIVE mg/dL
Nitrite: POSITIVE — AB
PH: 5 (ref 5.0–8.0)
Protein, ur: 100 mg/dL — AB
Specific Gravity, Urine: 1.02 (ref 1.005–1.030)
WBC, UA: >50 WBC/hpf — ABNORMAL HIGH (ref 0–5)

## 2018-06-01 LAB — C-REACTIVE PROTEIN: CRP: 1.7 mg/dL — ABNORMAL HIGH (ref <1.0)

## 2018-06-01 LAB — SEDIMENTATION RATE: Sed Rate: 50 mm/hr — ABNORMAL HIGH (ref 0–22)

## 2018-06-01 LAB — URIC ACID: URIC ACID, SERUM: 5.6 mg/dL (ref 2.5–7.1)

## 2018-06-01 MED ORDER — TRAMADOL HCL 50 MG PO TABS: 50.0000 mg | ORAL_TABLET | Freq: Four times a day (QID) | ORAL | 0 refills | Status: AC | PRN

## 2018-06-01 MED ORDER — IBUPROFEN 600 MG PO TABS: 600.0000 mg | ORAL_TABLET | Freq: Four times a day (QID) | ORAL | 0 refills | Status: DC | PRN

## 2018-06-01 MED ORDER — SULFAMETHOXAZOLE-TRIMETHOPRIM 800-160 MG PO TABS: 1.0000 | ORAL_TABLET | Freq: Two times a day (BID) | ORAL | 0 refills | Status: AC

## 2018-06-01 NOTE — ED Provider Notes (Signed)
[facility] EMERGENCY DEPARTMENT Provider Note   CSN: [id number] Arrival date & time: 06/01/18  1015     History   Chief Complaint Chief Complaint  Patient presents with  . Leg Swelling  . Fatigue    HPI Jill [name] is a 57 y.o. female with a history significant for type 2 diabetes, hypertension, diabetic neuropathy and CAD with CABG times 23 July 2017 with persistent cardiomyopathy and diastolic dysfunction presenting with a 3-week history of worsening right lower extremity swelling.  She denies injuries to the leg foot or ankle.  She has minimal pain, but describes a full pressure sensation in this leg.  She was seen by cardiology last month at which time it was felt her swelling was possibly from her CABG surgery (right saphenous vein used) and an [place] study on 7/15 was negative for dvt.  She was seen at an urgent care center today and was referred here secondary to "abnormal plain film findings" over concern for possible infection vs gout.  She denies fevers or chills, denies shortness of breath, chest pain.  She does report having a severe infection in the heel of her foot over a year ago from a puncture wound for which she underwent wound care by local podiatryand the symptoms have completely resolved.  She denies a history of gout.  She denies fevers or chills, but does endorse localized warmth which worsens the more she is weightbearing.  She has been elevating her foot is much as possible but her symptoms do not completely resolve.  She does endorse generalized vague fatigue.  Per chart, right saphenous vein used for CABG.   The history is provided by the patient and a relative (son). History limited by: Pt speaks [place], prefers [place].    Past Medical History:  Diagnosis Date  . Diabetic neuropathy (HCC)   . Dyspnea   . Dysrhythmia   . Headache   . Hyperlipidemia   . Hypertension   . Type 2 diabetes mellitus [facility])     Patient Active Problem List   Diagnosis Date  Noted  . 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 Dyspnea   . Coronary artery disease   . Severe mitral insufficiency   . Atrial flutter (HCC)   . Mitral regurgitation 06/24/2017  . Cardiomyopathy (HCC) 06/20/2017  . Elevated troponin 06/19/2017  . CKD (chronic kidney disease) stage 3, GFR 30-59 ml/min (HCC) 06/19/2017  . Pressure injury of skin 06/19/2017  . Hyperkalemia 06/18/2017  . Type 2 diabetes mellitus, uncontrolled, with neuropathy (HCC) 06/18/2017  . Hyperlipidemia 06/18/2017  . Chest pain 06/18/2017  . Bronchopneumonia 03/06/2016  . CAP (community acquired pneumonia) 03/05/2016  . Insulin dependent diabetes mellitus (HCC) 03/05/2016  . Essential hypertension 03/05/2016  . Hyponatremia 03/05/2016  . Normocytic anemia 03/05/2016  . Hyperglycemia 03/05/2016  . Dehydration 03/05/2016  . Hypoxia 03/05/2016  . Leukocytosis 03/05/2016  . Acute respiratory failure with hypoxia (HCC) 03/05/2016    Past Surgical History:  Procedure Laterality Date  . CORONARY ARTERY BYPASS GRAFT N/A 07/22/2017   Procedure: CORONARY ARTERY BYPASS GRAFTING (CABG) x four, using left internal mammary artery and right leg greater saphenous vein harvested endoscopically;  Surgeon: Loreli Slot, MD;  Location: [facility] OR;  Service: Open Heart Surgery;  Laterality: N/A;  . RIGHT/LEFT HEART CATH AND CORONARY ANGIOGRAPHY N/A 06/26/2017   Procedure: RIGHT/LEFT HEART CATH AND CORONARY ANGIOGRAPHY;  Surgeon: Marykay Lex, MD;  Location: [facility] INVASIVE CV LAB;  Service: Cardiovascular;  Laterality: N/A;  .  TEE WITHOUT CARDIOVERSION N/A 06/25/2017   Procedure: TRANSESOPHAGEAL ECHOCARDIOGRAM (TEE);  Surgeon: Thurmon Fair, MD;  Location: [facility] ENDOSCOPY;  Service: Cardiovascular;  Laterality: N/A;  . TEE WITHOUT CARDIOVERSION N/A 07/22/2017   Procedure: TRANSESOPHAGEAL ECHOCARDIOGRAM (TEE);  Surgeon: Loreli Slot, MD;  Location: [facility] OR;  Service: Open Heart Surgery;  Laterality: N/A;  . TUBAL LIGATION       OB  History    Gravida  4   Para  4   Term  4   Preterm      AB      Living        SAB      TAB      Ectopic      Multiple      Live Births               Home Medications    Prior to Admission medications   Medication Sig Start Date End Date Taking? Authorizing Provider  acetaminophen (TYLENOL) 500 MG tablet Take 2 tablets (1,000 mg total) by mouth every 6 (six) hours as needed. 07/27/17  Yes Barrett, Rae Roam, PA-C  aspirin EC 81 MG tablet Take 81 mg by mouth daily.   Yes [provider]  Dulaglutide (TRULICITY) 0.75 MG/0.5ML SOPN Inject 0.75 mg into the skin every 7 (seven) days. tuesdays   Yes [provider]  gabapentin (NEURONTIN) 400 MG capsule Take 400 mg by mouth 3 (three) times daily as needed (foot pain).    Yes [provider]  insulin detemir (LEVEMIR) 100 UNIT/ML injection Inject 45 Units into the skin 2 (two) times daily.    Yes [provider]  loratadine (CLARITIN) 10 MG tablet Take 10 mg by mouth daily.   Yes [provider]  metoprolol succinate (TOPROL-XL) 25 MG 24 hr tablet Take 0.5 tablets (12.5 mg total) by mouth daily. 12/31/17 06/01/18 Yes Eber Hong, MD  midodrine (PROAMATINE) 5 MG tablet Take 1 tablet (5 mg total) by mouth 3 (three) times daily with meals. 04/29/18  Yes BranchDorothe Pea, MD  rosuvastatin (CRESTOR) 20 MG tablet Take 1 tablet (20 mg total) by mouth at bedtime. 10/05/17  Yes Jodelle Gross, NP  silver sulfADIAZINE (SILVADENE) 1 % cream Apply 1 application topically daily.   Yes [provider]  vitamin C (ASCORBIC ACID) 500 MG tablet Take 500 mg by mouth daily.   Yes [provider]  ibuprofen (ADVIL,MOTRIN) 600 MG tablet Take 1 tablet (600 mg total) by mouth every 6 (six) hours as needed for moderate pain. 06/01/18   Burgess Amor, PA-C  sulfamethoxazole-trimethoprim (BACTRIM DS,SEPTRA DS) 800-160 MG tablet Take 1 tablet by mouth 2 (two) times daily for 10 days. 06/01/18  06/11/18  Burgess Amor, PA-C  traMADol (ULTRAM) 50 MG tablet Take 1 tablet (50 mg total) by mouth every 6 (six) hours as needed. 06/01/18   Burgess Amor, PA-C    Family History Family History  Problem Relation Age of Onset  . Diabetes type II Other   . Hypertension Other     Social History Social History   Tobacco Use  . Smoking status: Never Smoker  . Smokeless tobacco: Never Used  Substance Use Topics  . Alcohol use: No  . Drug use: No     Allergies   Patient has no known allergies.   Review of Systems Review of Systems  Constitutional: Positive for fatigue. Negative for chills and fever.  Musculoskeletal: Positive for arthralgias. Negative for myalgias.  Negative except as mentioned in HPI.   Skin: Negative for color change and wound.  Neurological: Negative for weakness and numbness.     Physical Exam Updated Vital Signs BP 135/74 (BP Location: Left Arm)   Pulse 81   Temp 98.4 F (36.9 C) (Oral)   Resp 17   Ht 5\' 2"  (1.575 m)   Wt 117.9 kg   SpO2 95%   BMI 47.55 kg/m   Physical Exam  Constitutional: She appears well-developed and well-nourished.  HENT:  Head: Atraumatic.  Neck: Normal range of motion.  Cardiovascular:  Pulses equal bilaterally  Musculoskeletal: She exhibits edema. She exhibits no tenderness.  Pt has mild ttp right mid foot. Generalized edema right foot dorsum through right calf.  Dorsalis pedal pulse intact.  Distal sensation intact.  Negative Homan's sign.  Well healed incisions from vein harvesting present.  Neurological: She is alert. She has normal strength. She displays normal reflexes. No sensory deficit.  Skin: Skin is warm and dry.  Psychiatric: She has a normal mood and affect.     ED Treatments / Results  Labs (all labs ordered are listed, but only abnormal results are displayed) Labs Reviewed  URINALYSIS, ROUTINE W REFLEX MICROSCOPIC - Abnormal; Notable for the following components:      Result Value   APPearance  CLOUDY (*)    Glucose, UA >=500 (*)    Protein, ur 100 (*)    Nitrite POSITIVE (*)    Leukocytes, UA MODERATE (*)    WBC, UA >50 (*)    Bacteria, UA RARE (*)    All other components within normal limits  CBC WITH DIFFERENTIAL/PLATELET - Abnormal; Notable for the following components:   Hemoglobin 11.4 (*)    MCH 25.2 (*)    All other components within normal limits  COMPREHENSIVE METABOLIC PANEL - Abnormal; Notable for the following components:   Glucose, Bld 263 (*)    BUN 38 (*)    Creatinine, Ser 1.46 (*)    Calcium 8.7 (*)    Albumin 3.4 (*)    AST 14 (*)    GFR calc non Af Amer 39 (*)    GFR calc Af Amer 45 (*)    All other components within normal limits  SEDIMENTATION RATE - Abnormal; Notable for the following components:   Sed Rate 50 (*)    All other components within normal limits  CBG MONITORING, ED - Abnormal; Notable for the following components:   Glucose-Capillary 252 (*)    All other components within normal limits  URINE CULTURE  URIC ACID  C-REACTIVE PROTEIN    EKG EKG Interpretation  Date/Time:  Tuesday June 01 2018 10:49:16 EDT Ventricular Rate:  76 PR Interval:    QRS Duration: 92 QT Interval:  403 QTC Calculation: 454 R Axis:   59 Text Interpretation:  Sinus rhythm Borderline T wave abnormalities No significant change since last tracing Confirmed by Vanetta Mulders [phone]) on 06/01/2018 11:06:08 AM   Radiology Mr Foot Right Wo Contrast  Result Date: 06/01/2018 CLINICAL DATA:  Foot and ankle swelling for 2 weeks EXAM: MRI OF THE RIGHT FOREFOOT WITHOUT CONTRAST TECHNIQUE: Multiplanar, multisequence MR imaging of the right foot was performed. No intravenous contrast was administered. COMPARISON:  None. FINDINGS: TENDONS Peroneal: Peroneal longus tendon intact. Peroneal brevis intact. Moderate amount of fluid in the peroneal tendon sheath. Posteromedial: Posterior tibial tendon intact. Flexor hallucis longus tendon intact. Flexor digitorum longus  tendon intact. Anterior: Tibialis anterior tendon intact. Extensor hallucis longus  tendon intact Extensor digitorum longus tendon intact. Achilles:  Intact. Plantar Fascia: Increased signal and thickening of the medial band of the plantar fascia as can be seen with plantar fasciitis with subcortical reactive marrow edema. LIGAMENTS Lateral: Anterior talofibular ligament intact. Calcaneofibular ligament intact. Posterior talofibular ligament intact. Anterior and posterior tibiofibular ligaments intact. Medial: Deltoid ligament intact. Spring ligament intact. CARTILAGE Ankle Joint: Small joint effusion. Normal ankle mortise. No chondral defect. Subtalar Joints/Sinus Tarsi: Normal subtalar joints. No subtalar joint effusion. Normal sinus tarsi. Bones and soft tissues: Marrow edema within the navicular, cuboid, and cuneiforms with mild synovitis and without cortical destruction. Relative pes planus. Mildly displaced comminuted fracture of the medial aspect of the navicular. Soft tissue edema involving the lower leg and foot. IMPRESSION: 1. Marrow edema within the navicular, cuboid, and cuneiforms with mild synovitis and without cortical destruction. Findings are concerning for a Charcot joint versus less likely osteomyelitis. No drainable fluid collection to suggest an abscess. 2. Mildly displaced comminuted fracture of the medial aspect of the navicular. 3. Peroneal tenosynovitis. 4. Plantar fasciitis of the medial band of the plantar fascia. Electronically Signed   By: Elige Ko   On: 06/01/2018 15:02    Procedures Procedures (including critical care time)  Medications Ordered in ED Medications - No data to display   Initial Impression / Assessment and Plan / ED Course  I have reviewed the triage vital signs and the nursing notes.  Pertinent labs & imaging results that were available during my care of the patient were reviewed by me and considered in my medical decision making (see chart for  details).     Radiology review of xrays from [place] and Urgent Care concerning for navicular osteomyelitis, recommending MRI which has been ordered.   4:54 PM MRI reviewed and discussed with pt and son.  Exam and hx, mri most likely from acute fx/charcot foot, less likely osteomyelitis.  This was discussed at great detail and emphasis placed on need for close f/u.  Referral to podiatry given, pt and family aware to call for appt.  Cam boot and walker (prescribed),  Advised to chest foot daily out of walker to ensure to blisters or other skin irritation from this boot.  Elevation, heel walk as much as possible to rest mid and front foot.  Tramadol, ibuprofen.  Also pt has uti with +nitrites, denies sx, but will cover with abx, bactrim, cx ordered.  Strict return precautions discussed.   Final Clinical Impressions(s) / ED Diagnoses   Final diagnoses:  Closed nondisplaced fracture of navicular bone of right foot, initial encounter  Charcot's joint of right foot  Acute cystitis without hematuria    ED Discharge Orders         Ordered    traMADol (ULTRAM) 50 MG tablet  Every 6 hours PRN     06/01/18 1618    ibuprofen (ADVIL,MOTRIN) 600 MG tablet  Every 6 hours PRN     06/01/18 1618    sulfamethoxazole-trimethoprim (BACTRIM DS,SEPTRA DS) 800-160 MG tablet  2 times daily     06/01/18 1627    Walker standard     06/01/18 1627           Burgess Amor, PA-C 06/01/18 1658    Vanetta Mulders, MD 06/03/18 [phone]

## 2018-06-01 NOTE — Discharge Instructions (Signed)
You have been prescribed pain medicine to use as needed and instructed for pain relief.  Elevate your foot as much as possible and use the walker you have been prescribed (will need to pick up at a medical supply like [employer]).  Call Dr. Marylene Land for an office followup of your injury and further management of these symptoms I suspect are related to your diabetes (as discussed, a condition called Charcot foot).  Your MRI suggests this is probably not an infected bone causing your symptoms, however this cannot be ruled 100% at this time, so this will require close followup.    Also, your labs suggest you have a urinary tract infection and are being prescribed antibiotics for this. Take the entire course of these antibiotics.

## 2018-06-01 NOTE — ED Triage Notes (Addendum)
Pt c/o swelling and pain to RLE x 3 weeks. Denies injury. Pt was seen at urgent care today and was sent to ER for further evaluation. Pt had xrays taken at urgent care and brought the CD with her.   Pt also c/o feeling very tired for the past month.

## 2018-06-03 ENCOUNTER — Telehealth: Payer: Self-pay

## 2018-06-03 DIAGNOSIS — Z79899 Other long term (current) drug therapy: Secondary | ICD-10-CM

## 2018-06-03 NOTE — Telephone Encounter (Signed)
Called pt. No answer. Left message for pt to return call.  Will mail lab slip

## 2018-06-03 NOTE — Telephone Encounter (Signed)
-----   Message from Antoine Poche, MD sent at 06/03/2018 12:48 PM EDT ----- Renal function is improving, potassium is a little elevated. The repeat she had in the ER her potassium was normal.  Repeat BMET/Mg in 2 weeks  Dominga Ferry MD

## 2018-06-04 LAB — URINE CULTURE: Special Requests: NORMAL

## 2018-06-05 ENCOUNTER — Telehealth: Payer: Self-pay

## 2018-06-05 NOTE — Telephone Encounter (Signed)
Post ED Visit - Positive Culture Follow-up: Successful Patient Follow-Up  Culture assessed and recommendations reviewed by:  []  Enzo Bi, Pharm.D. []  Celedonio Miyamoto, Pharm.D., BCPS AQ-ID []  Garvin Fila, Pharm.D., BCPS []  Georgina Pillion, Pharm.D., BCPS []  [place], [address].D., BCPS, AAHIVP []  Estella Husk, Pharm.D., BCPS, AAHIVP []  Lysle Pearl, PharmD, BCPS []  Phillips Climes, PharmD, BCPS []  Agapito Games, PharmD, BCPS []  Verlan Friends, PharmD Velva Harman Pharm D Positive urine culture  []  Patient discharged without antimicrobial prescription and treatment is now indicated [x]  Organism is resistant to prescribed ED discharge antimicrobial []  Patient with positive blood cultures  Changes discussed with ED provider: Elpidio Anis [facility] New antibiotic prescription Keflex 500 mg BID x 5 days Called to [facility]  [phone]  Contacted patient, date 06/05/18, time 1108   Ky Rumple, Linnell Fulling 06/05/2018, 11:06 AM

## 2018-06-05 NOTE — Progress Notes (Signed)
ED Antimicrobial Stewardship Positive Culture Follow Up   Jill [name] is an 57 y.o. female who presented to [facility] on 06/01/2018 with a chief complaint of  Chief Complaint  Patient presents with  . Leg Swelling  . Fatigue    Recent Results (from the past 720 hour(s))  Urine Culture     Status: Abnormal   Collection Time: 06/01/18  3:30 PM  Result Value Ref Range Status   Specimen Description   Final    URINE, CLEAN CATCH Performed at [facility], [address] [place], [place] [address]    Special Requests   Final    Normal Performed at [facility], [address], [place], [place] [address]    Culture >=100,000 COLONIES/mL ESCHERICHIA COLI (A)  Final   Report Status 06/04/2018 FINAL  Final   Organism ID, Bacteria ESCHERICHIA COLI (A)  Final      Susceptibility   Escherichia coli - MIC*    AMPICILLIN >=32 RESISTANT Resistant     CEFAZOLIN <=4 SENSITIVE Sensitive     CEFTRIAXONE <=1 SENSITIVE Sensitive     CIPROFLOXACIN <=0.25 SENSITIVE Sensitive     GENTAMICIN <=1 SENSITIVE Sensitive     IMIPENEM <=0.25 SENSITIVE Sensitive     NITROFURANTOIN 64 INTERMEDIATE Intermediate     TRIMETH/SULFA >=320 RESISTANT Resistant     AMPICILLIN/SULBACTAM 16 INTERMEDIATE Intermediate     PIP/TAZO <=4 SENSITIVE Sensitive     Extended ESBL NEGATIVE Sensitive     * >=100,000 COLONIES/mL ESCHERICHIA COLI    [x]  Treated with Bactrim, organism resistant to prescribed antimicrobial  New antibiotic prescription: Stop Bactrim. Treat with Keflex 500mg  PO Q12h x 5 days  ED Provider: Elpidio Anis, PA-C   Almon Hercules 06/05/2018, 10:45 AM Clinical Pharmacist Monday - Friday phone -  [phone] Saturday - Sunday phone - [phone]

## 2018-06-14 ENCOUNTER — Ambulatory Visit: Payer: Medicaid Other | Admitting: Podiatry

## 2018-06-14 VITALS — BP 115/63 | HR 79

## 2018-06-14 DIAGNOSIS — M14671 Charcot's joint, right ankle and foot: Secondary | ICD-10-CM

## 2018-06-16 NOTE — Progress Notes (Signed)
   HPI: 57 year old female with a PMHx of T2DM presenting today as a new patient with a chief complaint of right foot pain. She was seen in the ED on 06/01/18 and an MRI was ordered. Patient was then referred here for follow up evaluation of Charcot's foot and was prescribed Tramadol and Ibuprofen for pain. There are no modifying factors noted. Patient is here for further evaluation and treatment.   Past Medical History:  Diagnosis Date  . Diabetic neuropathy (HCC)   . Dyspnea   . Dysrhythmia   . Headache   . Hyperlipidemia   . Hypertension   . Type 2 diabetes mellitus (HCC)      Physical Exam: General: The patient is alert and oriented x3 in no acute distress.  Dermatology: Skin is warm, dry and supple bilateral lower extremities. Negative for open lesions or macerations.  Vascular: Mild erythema and edema of the right foot. Palpable pedal pulses bilaterally. Capillary refill within normal limits.  Neurological: Epicritic and protective threshold diminished bilaterally.   Musculoskeletal Exam: Range of motion within normal limits to all pedal and ankle joints bilateral. Muscle strength 5/5 in all groups bilateral.   MRI Impression:   Assessment: 1. Marrow edema within the navicular, cuboid, and cuneiforms with mild synovitis and without cortical destruction. Findings are concerning for a Charcot joint versus less likely osteomyelitis. No drainable fluid collection to suggest an abscess. 2. Mildly displaced comminuted fracture of the medial aspect of the navicular. 3. Peroneal tenosynovitis. 4. Plantar fasciitis of the medial band of the plantar fascia.   Plan of Care:  1. Patient evaluated. MRI reviewed.  2. Continue nonweightbearing in CAM boot and walker.  3. Has appointment with PCP on 06/30/18 to address her diabetes.  4. Return to clinic in 4 weeks for follow up X-Ray.       Felecia Shelling, DPM [facility]  Dr. Felecia Shelling, DPM    [address]  [place], [place] [address]                Office [phone]  Fax [phone]

## 2018-07-06 ENCOUNTER — Other Ambulatory Visit: Payer: Self-pay | Admitting: Cardiology

## 2018-07-07 ENCOUNTER — Other Ambulatory Visit: Payer: Self-pay

## 2018-07-07 ENCOUNTER — Encounter (HOSPITAL_COMMUNITY): Payer: Self-pay | Admitting: Emergency Medicine

## 2018-07-07 ENCOUNTER — Encounter: Payer: Self-pay | Admitting: Cardiology

## 2018-07-07 ENCOUNTER — Emergency Department (HOSPITAL_COMMUNITY)
Admission: EM | Admit: 2018-07-07 | Discharge: 2018-07-07 | Disposition: A | Discharge status: Home / Self Care | Payer: Medicaid Other | Attending: Emergency Medicine | Admitting: Emergency Medicine

## 2018-07-07 ENCOUNTER — Emergency Department (HOSPITAL_COMMUNITY): Payer: Medicaid Other

## 2018-07-07 ENCOUNTER — Ambulatory Visit (INDEPENDENT_AMBULATORY_CARE_PROVIDER_SITE_OTHER): Payer: Medicaid Other | Admitting: Cardiology

## 2018-07-07 VITALS — BP 124/80 | HR 77 | Ht 63.0 in | Wt 219.0 lb

## 2018-07-07 DIAGNOSIS — I959 Hypotension, unspecified: Secondary | ICD-10-CM | POA: Diagnosis not present

## 2018-07-07 DIAGNOSIS — Z79899 Other long term (current) drug therapy: Secondary | ICD-10-CM | POA: Diagnosis not present

## 2018-07-07 DIAGNOSIS — Z7982 Long term (current) use of aspirin: Secondary | ICD-10-CM | POA: Diagnosis not present

## 2018-07-07 DIAGNOSIS — Z794 Long term (current) use of insulin: Secondary | ICD-10-CM | POA: Diagnosis not present

## 2018-07-07 DIAGNOSIS — E875 Hyperkalemia: Secondary | ICD-10-CM | POA: Insufficient documentation

## 2018-07-07 DIAGNOSIS — E1122 Type 2 diabetes mellitus with diabetic chronic kidney disease: Secondary | ICD-10-CM | POA: Diagnosis not present

## 2018-07-07 DIAGNOSIS — I2510 Atherosclerotic heart disease of native coronary artery without angina pectoris: Secondary | ICD-10-CM

## 2018-07-07 DIAGNOSIS — I129 Hypertensive chronic kidney disease with stage 1 through stage 4 chronic kidney disease, or unspecified chronic kidney disease: Secondary | ICD-10-CM | POA: Insufficient documentation

## 2018-07-07 DIAGNOSIS — N183 Chronic kidney disease, stage 3 (moderate): Secondary | ICD-10-CM | POA: Insufficient documentation

## 2018-07-07 DIAGNOSIS — Z951 Presence of aortocoronary bypass graft: Secondary | ICD-10-CM | POA: Diagnosis not present

## 2018-07-07 HISTORY — DX: Hyperkalemia: E87.5

## 2018-07-07 LAB — POCT I-STAT, CHEM 8
BUN: 28 mg/dL — ABNORMAL HIGH (ref 6–20)
CALCIUM ION: 1.12 mmol/L — AB (ref 1.15–1.40)
Chloride: 110 mmol/L (ref 98–111)
Creatinine, Ser: 1.1 mg/dL — ABNORMAL HIGH (ref 0.44–1.00)
GLUCOSE: 196 mg/dL — AB (ref 70–99)
HCT: 32 % — ABNORMAL LOW (ref 36.0–46.0)
HEMOGLOBIN: 10.9 g/dL — AB (ref 12.0–15.0)
Potassium: 4.3 mmol/L (ref 3.5–5.1)
Sodium: 143 mmol/L (ref 135–145)
TCO2: 24 mmol/L (ref 22–32)

## 2018-07-07 LAB — BASIC METABOLIC PANEL
ANION GAP: 4 — AB (ref 5–15)
BUN/Creatinine Ratio: 31 — ABNORMAL HIGH (ref 9–23)
BUN: 36 mg/dL — ABNORMAL HIGH (ref 6–24)
BUN: 30 mg/dL — AB (ref 6–20)
CALCIUM: 9.1 mg/dL (ref 8.7–10.2)
CALCIUM: 8.4 mg/dL — AB (ref 8.9–10.3)
CO2: 24 mmol/L (ref 20–29)
CO2: 28 mmol/L (ref 22–32)
CREATININE: 1.15 mg/dL — AB (ref 0.57–1.00)
CREATININE: 1.27 mg/dL — AB (ref 0.44–1.00)
Chloride: 108 mmol/L — ABNORMAL HIGH (ref 96–106)
Chloride: 106 mmol/L (ref 98–111)
GFR calc Af Amer: 61 mL/min/{1.73_m2} (ref >59)
GFR calc Af Amer: 53 mL/min — ABNORMAL LOW (ref >60)
GFR calc non Af Amer: 53 mL/min/{1.73_m2} — ABNORMAL LOW (ref >59)
GFR calc non Af Amer: 46 mL/min — ABNORMAL LOW (ref >60)
GLUCOSE: 90 mg/dL (ref 65–99)
GLUCOSE: 367 mg/dL — AB (ref 70–99)
POTASSIUM: 6.2 mmol/L — AB (ref 3.5–5.2)
Potassium: 6.5 mmol/L (ref 3.5–5.1)
Sodium: 143 mmol/L (ref 134–144)
Sodium: 138 mmol/L (ref 135–145)

## 2018-07-07 LAB — CBC
HCT: 34.3 % — ABNORMAL LOW (ref 36.0–46.0)
Hemoglobin: 10.4 g/dL — ABNORMAL LOW (ref 12.0–15.0)
MCH: 25.3 pg — ABNORMAL LOW (ref 26.0–34.0)
MCHC: 30.3 g/dL (ref 30.0–36.0)
MCV: 83.5 fL (ref 78.0–100.0)
PLATELETS: 278 10*3/uL (ref 150–400)
RBC: 4.11 MIL/uL (ref 3.87–5.11)
RDW: 14.9 % (ref 11.5–15.5)
WBC: 8.5 10*3/uL (ref 4.0–10.5)

## 2018-07-07 LAB — GLUCOSE, CAPILLARY: GLUCOSE-CAPILLARY: 201 mg/dL — AB (ref 70–99)

## 2018-07-07 LAB — MAGNESIUM: MAGNESIUM: 2.3 mg/dL (ref 1.6–2.3)

## 2018-07-07 LAB — TROPONIN I

## 2018-07-07 LAB — SPECIMEN STATUS REPORT

## 2018-07-07 MED ORDER — DEXTROSE 50 % IV SOLN
1.0000 | Freq: Once | INTRAVENOUS | Status: AC
  Administered 2018-07-07: 50 mL via INTRAVENOUS

## 2018-07-07 MED ORDER — SODIUM CHLORIDE 0.9 % IV BOLUS
1000.0000 mL | Freq: Once | INTRAVENOUS | Status: AC
Start: 2018-07-07 — End: 2018-07-07
  Administered 2018-07-07: 1000 mL via INTRAVENOUS

## 2018-07-07 MED ORDER — DEXTROSE 50 % IV SOLN
INTRAVENOUS | Status: AC
  Filled 2018-07-07: qty 50

## 2018-07-07 MED ORDER — DEXTROSE 50 % IV SOLN: 1.0000 | Freq: Once | INTRAVENOUS | Status: DC

## 2018-07-07 MED ORDER — INSULIN ASPART 100 UNIT/ML ~~LOC~~ SOLN
SUBCUTANEOUS | Status: AC
  Filled 2018-07-07: qty 1

## 2018-07-07 MED ORDER — ALBUTEROL SULFATE (2.5 MG/3ML) 0.083% IN NEBU
10.0000 mg | INHALATION_SOLUTION | Freq: Once | RESPIRATORY_TRACT | Status: AC
  Administered 2018-07-07: 10 mg via RESPIRATORY_TRACT
  Filled 2018-07-07: qty 12

## 2018-07-07 MED ORDER — INSULIN ASPART 100 UNIT/ML IV SOLN: 5.0000 [IU] | Freq: Once | INTRAVENOUS | Status: DC

## 2018-07-07 MED ORDER — INSULIN ASPART 100 UNIT/ML IV SOLN
10.0000 [IU] | Freq: Once | INTRAVENOUS | Status: AC
  Administered 2018-07-07: 10 [IU] via INTRAVENOUS

## 2018-07-07 MED ORDER — SODIUM BICARBONATE 8.4 % IV SOLN
50.0000 meq | Freq: Once | INTRAVENOUS | Status: AC
  Administered 2018-07-07: 50 meq via INTRAVENOUS
  Filled 2018-07-07: qty 50

## 2018-07-07 NOTE — ED Provider Notes (Signed)
[facility] EMERGENCY DEPARTMENT Provider Note   CSN: [id number] Arrival date & time: 07/07/18  1103     History   Chief Complaint Chief Complaint  Patient presents with  . Abnormal Lab    hyperkalemia    HPI Jill [name] is a 57 y.o. female.  HPI Patient with multiple medical issues including diabetes, hypertension, CAD presents with concern of hyperkalemia. Patient notes that she went for a follow-up visit yesterday with her cardiologist, and blood values were notable for hyperkalemia. She was sent here for evaluation this morning. She denies weakness, pain, dyspnea. She denies recent medication change, diet change, activity change. She does have a recently diagnosed stress fracture in her right foot, but has been wearing a cam walker for 3 weeks, notes that her pain has improved.    Past Medical History:  Diagnosis Date  . Diabetic neuropathy (HCC)   . Dyspnea   . Dysrhythmia   . Headache   . Hyperkalemia   . Hyperlipidemia   . Hypertension   . Type 2 diabetes mellitus [facility])     Patient Active Problem List   Diagnosis Date Noted  . 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 Dyspnea   . Coronary artery disease   . Severe mitral insufficiency   . Atrial flutter (HCC)   . Mitral regurgitation 06/24/2017  . Cardiomyopathy (HCC) 06/20/2017  . Elevated troponin 06/19/2017  . CKD (chronic kidney disease) stage 3, GFR 30-59 ml/min (HCC) 06/19/2017  . Pressure injury of skin 06/19/2017  . Hyperkalemia 06/18/2017  . Type 2 diabetes mellitus, uncontrolled, with neuropathy (HCC) 06/18/2017  . Hyperlipidemia 06/18/2017  . Chest pain 06/18/2017  . Bronchopneumonia 03/06/2016  . CAP (community acquired pneumonia) 03/05/2016  . Insulin dependent diabetes mellitus (HCC) 03/05/2016  . Essential hypertension 03/05/2016  . Hyponatremia 03/05/2016  . Normocytic anemia 03/05/2016  . Hyperglycemia 03/05/2016  . Dehydration 03/05/2016  . Hypoxia 03/05/2016  . Leukocytosis 03/05/2016    . Acute respiratory failure with hypoxia (HCC) 03/05/2016    Past Surgical History:  Procedure Laterality Date  . CORONARY ARTERY BYPASS GRAFT N/A 07/22/2017   Procedure: CORONARY ARTERY BYPASS GRAFTING (CABG) x four, using left internal mammary artery and right leg greater saphenous vein harvested endoscopically;  Surgeon: Loreli Slot, MD;  Location: [facility] OR;  Service: Open Heart Surgery;  Laterality: N/A;  . RIGHT/LEFT HEART CATH AND CORONARY ANGIOGRAPHY N/A 06/26/2017   Procedure: RIGHT/LEFT HEART CATH AND CORONARY ANGIOGRAPHY;  Surgeon: Marykay Lex, MD;  Location: [facility] INVASIVE CV LAB;  Service: Cardiovascular;  Laterality: N/A;  . TEE WITHOUT CARDIOVERSION N/A 06/25/2017   Procedure: TRANSESOPHAGEAL ECHOCARDIOGRAM (TEE);  Surgeon: Thurmon Fair, MD;  Location: [facility] ENDOSCOPY;  Service: Cardiovascular;  Laterality: N/A;  . TEE WITHOUT CARDIOVERSION N/A 07/22/2017   Procedure: TRANSESOPHAGEAL ECHOCARDIOGRAM (TEE);  Surgeon: Loreli Slot, MD;  Location: [facility] OR;  Service: Open Heart Surgery;  Laterality: N/A;  . TUBAL LIGATION       OB History    Gravida  4   Para  4   Term  4   Preterm      AB      Living        SAB      TAB      Ectopic      Multiple      Live Births               Home Medications    Prior to Admission medications  Medication Sig Start Date End Date Taking? Authorizing Provider  acetaminophen (TYLENOL) 500 MG tablet Take 2 tablets (1,000 mg total) by mouth every 6 (six) hours as needed. 07/27/17  Yes Barrett, Rae Roam, PA-C  aspirin EC 81 MG tablet Take 81 mg by mouth daily.   Yes [provider]  Dulaglutide (TRULICITY) 0.75 MG/0.5ML SOPN Inject 0.75 mg into the skin every 7 (seven) days. tuesdays   Yes [provider]  gabapentin (NEURONTIN) 400 MG capsule Take 400 mg by mouth 3 (three) times daily as needed (foot pain).    Yes [provider]  ibuprofen (ADVIL,MOTRIN) 600 MG tablet Take 1 tablet (600  mg total) by mouth every 6 (six) hours as needed for moderate pain. 06/01/18  Yes Idol, Raynelle Fanning, PA-C  insulin detemir (LEVEMIR) 100 UNIT/ML injection Inject 45 Units into the skin 2 (two) times daily.    Yes [provider]  loratadine (CLARITIN) 10 MG tablet Take 10 mg by mouth daily.   Yes [provider]  metoprolol succinate (TOPROL-XL) 25 MG 24 hr tablet Take 0.5 tablets (12.5 mg total) by mouth daily. 12/31/17 07/07/18 Yes Eber Hong, MD  midodrine (PROAMATINE) 5 MG tablet Take 1 tablet (5 mg total) by mouth 3 (three) times daily with meals. 04/29/18  Yes BranchDorothe Pea, MD  rosuvastatin (CRESTOR) 20 MG tablet Take 1 tablet (20 mg total) by mouth at bedtime. 10/05/17  Yes Jodelle Gross, NP  traMADol (ULTRAM) 50 MG tablet Take 1 tablet (50 mg total) by mouth every 6 (six) hours as needed. 06/01/18  Yes Idol, Raynelle Fanning, PA-C  vitamin C (ASCORBIC ACID) 500 MG tablet Take 500 mg by mouth daily.   Yes [provider]  silver sulfADIAZINE (SILVADENE) 1 % cream Apply 1 application topically daily.    [provider]    Family History Family History  Problem Relation Age of Onset  . Diabetes type II Other   . Hypertension Other     Social History Social History   Tobacco Use  . Smoking status: Never Smoker  . Smokeless tobacco: Never Used  Substance Use Topics  . Alcohol use: No  . Drug use: No     Allergies   Patient has no known allergies.   Review of Systems Review of Systems  Constitutional:       Per HPI, otherwise negative  HENT:       Per HPI, otherwise negative  Respiratory:       Per HPI, otherwise negative  Cardiovascular:       Per HPI, otherwise negative  Gastrointestinal: Negative for vomiting.  Endocrine:       Negative aside from HPI  Genitourinary:       Neg aside from HPI   Musculoskeletal:       Per HPI, otherwise negative  Skin: Negative.   Neurological: Negative for syncope.     Physical Exam Updated  Vital Signs BP (!) 141/67 (BP Location: Left Arm)   Pulse (!) 107   Temp 98 F (36.7 C) (Oral)   Resp 16   Ht 5\' 2"  (1.575 m)   Wt 117.9 kg   SpO2 98%   BMI 47.55 kg/m   Physical Exam  Constitutional: She is oriented to person, place, and time. She appears well-developed and well-nourished. No distress.  HENT:  Head: Normocephalic and atraumatic.  Eyes: Conjunctivae and EOM are normal.  Cardiovascular: Normal rate and regular rhythm.  Pulmonary/Chest: Effort normal and breath sounds normal. No  stridor. No respiratory distress.  Abdominal: She exhibits no distension.  Musculoskeletal:       Feet:  Neurological: She is alert and oriented to person, place, and time. No cranial nerve deficit.  Skin: Skin is warm and dry.  Psychiatric: She has a normal mood and affect.  Nursing note and vitals reviewed.    ED Treatments / Results  Labs (all labs ordered are listed, but only abnormal results are displayed) Labs Reviewed  BASIC METABOLIC PANEL - Abnormal; Notable for the following components:      Result Value   Potassium 6.5 (*)    Glucose, Bld 367 (*)    BUN 30 (*)    Creatinine, Ser 1.27 (*)    Calcium 8.4 (*)    GFR calc non Af Amer 46 (*)    GFR calc Af Amer 53 (*)    Anion gap 4 (*)    All other components within normal limits  CBC - Abnormal; Notable for the following components:   Hemoglobin 10.4 (*)    HCT 34.3 (*)    MCH 25.3 (*)    All other components within normal limits  GLUCOSE, CAPILLARY - Abnormal; Notable for the following components:   Glucose-Capillary 201 (*)    All other components within normal limits  POCT I-STAT, CHEM 8 - Abnormal; Notable for the following components:   BUN 28 (*)    Creatinine, Ser 1.10 (*)    Glucose, Bld 196 (*)    Calcium, Ion 1.12 (*)    Hemoglobin 10.9 (*)    HCT 32.0 (*)    All other components within normal limits  TROPONIN I  I-STAT CHEM 8, ED    EKG EKG Interpretation  Date/Time:  Wednesday July 07 2018 15:37:49 EDT Ventricular Rate:  107 PR Interval:    QRS Duration: 90 QT Interval:  366 QTC Calculation: 489 R Axis:   88 Text Interpretation:  Sinus tachycardia Borderline T wave abnormalities Borderline prolonged QT interval No significant change since last tracing Abnormal ekg Confirmed by Gerhard Munch [phone]) on 07/07/2018 3:46:39 PM   Radiology Dg Chest 2 View  Result Date: 07/07/2018 CLINICAL DATA:  Hyperkalemia EXAM: CHEST - 2 VIEW COMPARISON:  08/25/2017 FINDINGS: Prior CABG. Heart is upper limits normal in size. No confluent airspace opacities or effusions. No acute bony abnormality. IMPRESSION: No active cardiopulmonary disease. Electronically Signed   By: Charlett Nose M.D.   On: 07/07/2018 11:34    Procedures Procedures (including critical care time)  Medications Ordered in ED Medications  sodium chloride 0.9 % bolus 1,000 mL (1,000 mLs Intravenous New Bag/Given 07/07/18 1316)  albuterol (PROVENTIL) (2.5 MG/3ML) 0.083% nebulizer solution 10 mg (10 mg Nebulization Given 07/07/18 1402)  sodium bicarbonate injection 50 mEq (50 mEq Intravenous Given 07/07/18 1317)  insulin aspart (novoLOG) injection 10 Units (10 Units Intravenous Given 07/07/18 1316)    And  dextrose 50 % solution 50 mL (50 mLs Intravenous Given 07/07/18 1317)     Initial Impression / Assessment and Plan / ED Course  I have reviewed the triage vital signs and the nursing notes.  Pertinent labs & imaging results that were available during my care of the patient were reviewed by me and considered in my medical decision making (see chart for details).    Evaluation notable for potassium value of 6.5. Patient was found to have elevated creatinine, and given concern for her medical condition, patient received fluids, dextrose, insulin, bicarbonate.   3:33 PM Patient's potassium  now normalized, after after mentioned medication. She remains without focal complaints. She remains hemodynamically stable as  well. Some suspicion for the patient's medications and comorbidities contributing to her episode, with known renal dysfunction. With reassuring EKG, repeat labs, absence of hemodynamic instability, she is appropriate for discharge with close outpatient follow-up.   Final Clinical Impressions(s) / ED Diagnoses  Hyperkalemia  CRITICAL CARE Performed by: Gerhard Munch Total critical care time: 35 minutes Critical care time was exclusive of separately billable procedures and treating other patients. Critical care was necessary to treat or prevent imminent or life-threatening deterioration. Critical care was time spent personally by me on the following activities: development of treatment plan with patient and/or surrogate as well as nursing, discussions with consultants, evaluation of patient's response to treatment, examination of patient, obtaining history from patient or surrogate, ordering and performing treatments and interventions, ordering and review of laboratory studies, ordering and review of radiographic studies, pulse oximetry and re-evaluation of patient's condition.    Gerhard Munch, MD 07/07/18 [phone]

## 2018-07-07 NOTE — Patient Instructions (Signed)
Medication Instructions:  Your physician recommends that you continue on your current medications as directed. Please refer to the Current Medication list given to you today.   Labwork: none  Testing/Procedures: none  Follow-Up: Your physician recommends that you schedule a follow-up appointment in: pending as you will need to go to ED for hyperkalemia   Any Other Special Instructions Will Be Listed Below (If Applicable).  I will request labs from DR. Befekadu   If you need a refill on your cardiac medications before your next appointment, please call your pharmacy.

## 2018-07-07 NOTE — ED Notes (Signed)
Pt would like something to drink at this time.

## 2018-07-07 NOTE — ED Triage Notes (Signed)
Pt saw dr branch yesterday and had blood work. Reports told her today to come to ED for increased K+. Denies symptoms.

## 2018-07-07 NOTE — ED Notes (Signed)
Pt given water at this time per MD approval. 

## 2018-07-07 NOTE — ED Notes (Signed)
ED Provider at bedside. 

## 2018-07-07 NOTE — Progress Notes (Signed)
Clinical Summary Jill [name] is a 57 y.o.female seentoday for follow up of the following medical problems.  1. CAD/ICM - admitted 06/2017 with chest pain. Trop to 0.3, EKG with inferior and lateral precordial ST/T changes - 05/2017 echo LVEF 45-50%, grade II diastolic dysfunction - cath 06/2017 - s/p CABG 07/21/17 LIMA-LAD, SVG-diag, SVG-OM2, SVG-PDA    - no ACE/ARB due to recurrent issues with hyperkalemia  - no recent chest pain, no SOB - compliant with meds  2. Pulmonary HTN - 06/2017 mean PA 46, PCWP 31 - combined pre and postcapillary pulmonary HTN, suspect chronically elevated left sided pressures with secondary pulmonary remodeling.  - denies any symptoms.   3. Mitral regurgitation - 06/25/17 TEE mod MR - intraop TEE 07/22/17 mild MR   4. Right leg swelling - right leg swelling since recent CABG. - appears she was on lasix for a period by another provider.   04/2018 venous US negative for DVT - significant AKI on lasix with Cr all the way up to 1.77 without much improvement in her leg swelling.  - MRI showed Charcots joint right leg with surrounding inflammation.    5. Hypotension - low bp's after CABG, was discharged on midodrine - we had tried lowering midodrine to bid, however had recurrent hypotension, though this also was around the time she was on lasix - bp's have normalized.    6 . Dizziness - occurs when waking up in the morning. Wakes up 3AM to go to bathrrom, feels very dizy - during the day dizziness with standing. - drinks water daily.Drinks 5-6 glasses daily - gaba 400 8 months ago - on lasix x 3 months bid.   - significant issues with dehydration in 04/2018 due to lasix.  - no recent symptoms since stopping lasix and increasing midodrine back to 5mg  tid. .    7. DM2 - HgbA1c was 14 04/2018 - in process of getting  Past Medical History:  Diagnosis Date  . Diabetic neuropathy (HCC)   . Dyspnea   . Dysrhythmia   .  Headache   . Hyperlipidemia   . Hypertension   . Type 2 diabetes mellitus (HCC)      No Known Allergies   Current Outpatient Medications  Medication Sig Dispense Refill  . acetaminophen (TYLENOL) 500 MG tablet Take 2 tablets (1,000 mg total) by mouth every 6 (six) hours as needed. 30 tablet 0  . aspirin EC 81 MG tablet Take 81 mg by mouth daily.    . Dulaglutide (TRULICITY) 0.75 MG/0.5ML SOPN Inject 0.75 mg into the skin every 7 (seven) days. tuesdays    . gabapentin (NEURONTIN) 400 MG capsule Take 400 mg by mouth 3 (three) times daily as needed (foot pain).     Marland Kitchen ibuprofen (ADVIL,MOTRIN) 600 MG tablet Take 1 tablet (600 mg total) by mouth every 6 (six) hours as needed for moderate pain. 20 tablet 0  . insulin detemir (LEVEMIR) 100 UNIT/ML injection Inject 45 Units into the skin 2 (two) times daily.     Marland Kitchen loratadine (CLARITIN) 10 MG tablet Take 10 mg by mouth daily.    . metoprolol succinate (TOPROL-XL) 25 MG 24 hr tablet Take 0.5 tablets (12.5 mg total) by mouth daily. 30 tablet 1  . midodrine (PROAMATINE) 5 MG tablet Take 1 tablet (5 mg total) by mouth 3 (three) times daily with meals. 270 tablet 1  . rosuvastatin (CRESTOR) 20 MG tablet Take 1 tablet (20 mg total) by mouth at bedtime. 90  tablet 3  . silver sulfADIAZINE (SILVADENE) 1 % cream Apply 1 application topically daily.    . traMADol (ULTRAM) 50 MG tablet Take 1 tablet (50 mg total) by mouth every 6 (six) hours as needed. 20 tablet 0  . vitamin C (ASCORBIC ACID) 500 MG tablet Take 500 mg by mouth daily.     No current facility-administered medications for this visit.      Past Surgical History:  Procedure Laterality Date  . CORONARY ARTERY BYPASS GRAFT N/A 07/22/2017   Procedure: CORONARY ARTERY BYPASS GRAFTING (CABG) x four, using left internal mammary artery and right leg greater saphenous vein harvested endoscopically;  Surgeon: Loreli Slot, MD;  Location: [facility] OR;  Service: Open Heart Surgery;  Laterality: N/A;    . RIGHT/LEFT HEART CATH AND CORONARY ANGIOGRAPHY N/A 06/26/2017   Procedure: RIGHT/LEFT HEART CATH AND CORONARY ANGIOGRAPHY;  Surgeon: Marykay Lex, MD;  Location: [facility] INVASIVE CV LAB;  Service: Cardiovascular;  Laterality: N/A;  . TEE WITHOUT CARDIOVERSION N/A 06/25/2017   Procedure: TRANSESOPHAGEAL ECHOCARDIOGRAM (TEE);  Surgeon: Thurmon Fair, MD;  Location: [facility] ENDOSCOPY;  Service: Cardiovascular;  Laterality: N/A;  . TEE WITHOUT CARDIOVERSION N/A 07/22/2017   Procedure: TRANSESOPHAGEAL ECHOCARDIOGRAM (TEE);  Surgeon: Loreli Slot, MD;  Location: [facility] OR;  Service: Open Heart Surgery;  Laterality: N/A;  . TUBAL LIGATION       No Known Allergies    Family History  Problem Relation Age of Onset  . Diabetes type II Other   . Hypertension Other      Social History Ms. Counihan reports that she has never smoked. She has never used smokeless tobacco. Ms. Lubben reports that she does not drink alcohol.   Review of Systems CONSTITUTIONAL: No weight loss, fever, chills, weakness or fatigue.  HEENT: Eyes: No visual loss, blurred vision, double vision or yellow sclerae.No hearing loss, sneezing, congestion, runny nose or sore throat.  SKIN: No rash or itching.  CARDIOVASCULAR: per hpi RESPIRATORY: No shortness of breath, cough or sputum.  GASTROINTESTINAL: No anorexia, nausea, vomiting or diarrhea. No abdominal pain or blood.  GENITOURINARY: No burning on urination, no polyuria NEUROLOGICAL: No headache, dizziness, syncope, paralysis, ataxia, numbness or tingling in the extremities. No change in bowel or bladder control.  MUSCULOSKELETAL: No muscle, back pain, joint pain or stiffness.  LYMPHATICS: No enlarged nodes. No history of splenectomy.  PSYCHIATRIC: No history of depression or anxiety.  ENDOCRINOLOGIC: No reports of sweating, cold or heat intolerance. No polyuria or polydipsia.  Marland Kitchen   Physical Examination Vitals:   07/07/18 1005  BP: 124/80  Pulse: 77  SpO2: 97%    Vitals:   07/07/18 1005  Weight: 219 lb (99.3 kg)  Height: 5\' 3"  (1.6 m)    Gen: resting comfortably, no acute distress HEENT: no scleral icterus, pupils equal round and reactive, no palptable cervical adenopathy,  CV: RRR, no m/r/g, no jvd Resp: Clear to auscultation bilaterally GI: abdomen is soft, non-tender, non-distended, normal bowel sounds, no hepatosplenomegaly MSK: extremities are warm, no edema.  Skin: warm, no rash Neuro:  no focal deficits Psych: appropriate affect   Diagnostic Studies 05/2017 echo  Study Conclusions  - Left ventricle: The cavity size was normal. Wall thickness was at the upper limits of normal. Systolic function was mildly reduced. The estimated ejection fraction was in the range of 45% to 50%. Diffuse hypokinesis. Features are consistent with a pseudonormal left ventricular filling pattern, with concomitant abnormal relaxation and increased filling pressure (grade 2 diastolic dysfunction). -  Mitral valve: There was moderate to severe regurgitation. - Left atrium: The atrium was moderately dilated. - Right atrium: Central venous pressure (est): 3 mm Hg. - Atrial septum: No defect or patent foramen ovale was identified. - Tricuspid valve: There was mild regurgitation. - Pulmonary arteries: Systolic pressure was severely increased. PA peak pressure: 62 mm Hg (S). - Pericardium, extracardiac: There was no pericardial effusion.  Impressions:  - Upper normal LV wall thickness with LVEF approximately 45-50% and diffuse hypokinesis. Grade 2 diastolic dysfunction with increased filling pressures. Moderate left atrial enlargement. There is moderate to severe mitral regurgitation with eccentric jet. Mechanism of mitral regurgitation is not clear, would suggest TEE for further evaluation. Mild tricuspid regurgitation with evidence of severe pulmonary hypertension and PASP 62 mmHg.  06/2017 TEE Study Conclusions  -  Left ventricle: Systolic function was mildly reduced. The estimated ejection fraction was in the range of 45% to 50%. Mild diffuse hypokinesis with no identifiable regional variations. - Aortic valve: No evidence of vegetation. - Mitral valve: Thickening, consistent with rheumatic disease. No evidence of vegetation. There was moderate regurgitation directed centrally, eccentrically, and anteriorly. - Left atrium: The atrium was moderately dilated. No evidence of thrombus in the atrial cavity or appendage. There was spontaneous echo contrast (&quot;smoke&quot;). - Right atrium: No evidence of thrombus in the atrial cavity or appendage. No evidence of thrombus in the atrial cavity or appendage. - Atrial septum: No defect or patent foramen ovale was identified. - Tricuspid valve: No evidence of vegetation. There was mild-moderate regurgitation directed centrally. - Pulmonic valve: No evidence of vegetation. No evidence of vegetation. - Pulmonary arteries: Systolic pressure was moderately increased. PA peak pressure: 59 mm Hg (S).  Impressions:  - Although the MR jet is eccentric and could be underestimated by color Doppler, there is consistent evidence for moderate severity based on continuity equation-derived regurgitant fraction, PISA-derived effective regurgitant orifice area and absence of pulmonary vein systolic flow reversal.  06/2017 RHC/LHC  Prox RCA lesion, 70 %stenosed. Mid RCA lesion, 40 %stenosed. Dist RCA lesion, 40 %stenosed.  Ost 2nd Diag to 2nd Diag lesion, 90 %stenosed.  Cx-OM1 bifurcation: Prox Cx lesion, 80 %stenosed. Mid Cx lesion, 100 %stenosed. Ost 1st Mrg lesion, 70 %stenosed.  There is moderate left ventricular systolic dysfunction. The left ventricular ejection fraction is 35-45% by visual estimate.  LV end diastolic pressure is severely elevated.  There is moderate (3+) mitral regurgitation by LV Gram, but Lage V wave on  PCWP would suggest Severe MR.  There is no aortic valve stenosis.  Hemodynamic findings consistent with severe pulmonary hypertension. Mostly Secondary to MR & elevated LVEDP.  Severe 2 Vessel disease (moderate to severe RCA and one or percent mid circumflex-OM occlusion) along with severe diffusely diseased very small caliber Diag branches and mild diffuse disease throughout the LAD.  Most likely at least moderate-severe mitral regurgitation based on large V wave.  Recommend CV surgery consultation to determine the best course of action would treating the patient's mitral valve and CAD.      Assessment and Plan   1. CAD - no symptoms, continue current meds  2. Pulmonary hypertension - evidence of pre and postcapillary pulm HTN by cath, suspect due to chronic left sided heart disease with secondary pulmonary vasculature remodeling No symptoms, continue to monitor.   3. Hypotension - improved. Continue midodrine. May have some orthostatic dysfunction due to her uncontrolled DM2.  - may try to lower midodrine again in the future, last trial was also  in the setting of being on lasix and becoming hypovolemic.    4. Hyperkalemia - noted on labs from yesterday, EKG without acute changes. We will refer her to ER for repeat and management. - previously followed by nephorlogy for hyperkalemia.      Antoine Poche, M.D.

## 2018-07-07 NOTE — Discharge Instructions (Addendum)
As discussed, it is important that you follow up with your physician for continued management of your condition. ° °If you develop any new, or concerning changes in your condition, please return to the emergency department immediately. °

## 2018-07-07 NOTE — ED Notes (Signed)
Pt does not need anything at this time. 

## 2018-07-08 ENCOUNTER — Telehealth: Payer: Self-pay

## 2018-07-08 DIAGNOSIS — E118 Type 2 diabetes mellitus with unspecified complications: Secondary | ICD-10-CM

## 2018-07-08 DIAGNOSIS — Z79899 Other long term (current) drug therapy: Secondary | ICD-10-CM

## 2018-07-08 NOTE — Telephone Encounter (Signed)
Referral placed. Will mail lab slip. Called pt. No answer.

## 2018-07-08 NOTE — Telephone Encounter (Signed)
-----   Message from Antoine Poche, MD sent at 07/08/2018  8:44 AM EDT ----- Patient needs to be referred to Dr Fransico Him endocrine for diabetes, can't remember if we did yesterday when she was in clinic. Also needs a BMET in 2 weeks.  Dominga Ferry MD

## 2018-07-12 ENCOUNTER — Ambulatory Visit (INDEPENDENT_AMBULATORY_CARE_PROVIDER_SITE_OTHER): Payer: Medicaid Other

## 2018-07-12 ENCOUNTER — Other Ambulatory Visit: Payer: Self-pay | Admitting: Podiatry

## 2018-07-12 ENCOUNTER — Ambulatory Visit: Payer: Medicaid Other | Admitting: Podiatry

## 2018-07-12 ENCOUNTER — Encounter: Payer: Self-pay | Admitting: Podiatry

## 2018-07-12 DIAGNOSIS — M14671 Charcot's joint, right ankle and foot: Secondary | ICD-10-CM

## 2018-07-12 DIAGNOSIS — E0843 Diabetes mellitus due to underlying condition with diabetic autonomic (poly)neuropathy: Secondary | ICD-10-CM

## 2018-07-12 DIAGNOSIS — B351 Tinea unguium: Secondary | ICD-10-CM | POA: Diagnosis not present

## 2018-07-12 DIAGNOSIS — Z79899 Other long term (current) drug therapy: Secondary | ICD-10-CM

## 2018-07-12 LAB — HEPATIC FUNCTION PANEL
AG Ratio: 1.1 (calc) (ref 1.0–2.5)
ALT: 18 U/L (ref 6–29)
AST: 19 U/L (ref 10–35)
Albumin: 3.7 g/dL (ref 3.6–5.1)
Alkaline phosphatase (APISO): 93 U/L (ref 33–130)
Bilirubin, Direct: 0.1 mg/dL (ref 0.0–0.2)
Globulin: 3.5 g/dL (calc) (ref 1.9–3.7)
Indirect Bilirubin: 0.2 mg/dL (calc) (ref 0.2–1.2)
TOTAL PROTEIN: 7.2 g/dL (ref 6.1–8.1)
Total Bilirubin: 0.3 mg/dL (ref 0.2–1.2)

## 2018-07-12 MED ORDER — TERBINAFINE HCL 250 MG PO TABS: 250.0000 mg | ORAL_TABLET | Freq: Every day | ORAL | 0 refills | Status: DC

## 2018-07-13 NOTE — Progress Notes (Signed)
   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  Patient states that she is feeling better.  She is establish care with a primary care physician and is managing her diabetes.  Patient was seen in the ED on 06/01/2018 and MRI was ordered consistent with Charcot neuroarthropathy.  She has been wearing the immobilization cam boot however she has been weightbearing.  She presents for follow-up treatment evaluation  Patient also has a new complaint regarding her toenails.  Patient is very concerned about the thickness and discoloration of her toenails.  Patient is unable to trim her own nails because of the thickness.  She states they are very unsightly and somewhat symptomatic and different shoe gear.  Patient would like to address her toenails and discuss treatment options.  Past Medical History:  Diagnosis Date  . Diabetic neuropathy (HCC)   . Dyspnea   . Dysrhythmia   . Headache   . Hyperkalemia   . Hyperlipidemia   . Hypertension   . Type 2 diabetes mellitus (HCC)      Physical Exam: General: The patient is alert and oriented x3 in no acute distress.  Dermatology: Hyperkeratotic, dystrophic nails noted 1-5 bilateral consistent with onychomycosis.  Skin is warm, dry and supple bilateral lower extremities. Negative for open lesions or macerations.  Vascular: Mild erythema and edema of the right foot. Palpable pedal pulses bilaterally. Capillary refill within normal limits.  Neurological: Epicritic and protective threshold diminished bilaterally.   Musculoskeletal Exam: Range of motion within normal limits to all pedal and ankle joints bilateral. Muscle strength 5/5 in all groups bilateral.   Radiographic exam: Mostly unchanged compared to prior x-rays.  Mildly displaced comminuted fracture medial aspect of the navicular noted.  Mild cortical destruction throughout the midtarsal  joint.  MRI Impression taken 06/01/18: 1. Marrow edema within the navicular, cuboid, and cuneiforms with mild synovitis and without cortical destruction. Findings are concerning for a Charcot joint versus less likely osteomyelitis. No drainable fluid collection to suggest an abscess. 2. Mildly displaced comminuted fracture of the medial aspect of the navicular. 3. Peroneal tenosynovitis. 4. Plantar fasciitis of the medial band of the plantar fascia.  Assessment: 1.  Charcot neuroarthropathy right lower extremity 2.  Onychomycosis 1-5 bilateral  Plan of Care:  1. Patient evaluated.  X-rays reviewed.  2. Continue nonweightbearing in CAM boot and walker.  Today I stressed the importance of strict nonweightbearing to the lower extremity. 3.  Continue management with primary care physician to get her diabetes under control 4.  Today we discussed different treatment options regarding her toenails.  Patient would like to begin oral antifungal medication to address the onychomycosis.  Order for liver function test placed today 5.  Prescription for Lamisil 250 mg #90 6.  Return to clinic in 6 weeks for follow-up evaluation and x-rays for the Charcot neuroarthropathy      Felecia Shelling, DPM [facility]  Dr. Felecia Shelling, DPM    [address] [place], [place] [address]                Office [phone]  Fax [phone]

## 2018-07-14 ENCOUNTER — Encounter: Payer: Medicaid Other | Attending: *Deleted | Admitting: Nutrition

## 2018-07-14 ENCOUNTER — Encounter: Payer: Self-pay | Admitting: Nutrition

## 2018-07-14 VITALS — Ht 62.0 in | Wt 222.0 lb

## 2018-07-14 DIAGNOSIS — I119 Hypertensive heart disease without heart failure: Secondary | ICD-10-CM | POA: Diagnosis not present

## 2018-07-14 DIAGNOSIS — E1165 Type 2 diabetes mellitus with hyperglycemia: Secondary | ICD-10-CM

## 2018-07-14 DIAGNOSIS — E119 Type 2 diabetes mellitus without complications: Secondary | ICD-10-CM | POA: Diagnosis not present

## 2018-07-14 DIAGNOSIS — E669 Obesity, unspecified: Secondary | ICD-10-CM

## 2018-07-14 DIAGNOSIS — Z713 Dietary counseling and surveillance: Secondary | ICD-10-CM | POA: Diagnosis not present

## 2018-07-14 DIAGNOSIS — Z951 Presence of aortocoronary bypass graft: Secondary | ICD-10-CM | POA: Diagnosis not present

## 2018-07-14 DIAGNOSIS — N289 Disorder of kidney and ureter, unspecified: Secondary | ICD-10-CM | POA: Insufficient documentation

## 2018-07-14 DIAGNOSIS — E782 Mixed hyperlipidemia: Secondary | ICD-10-CM | POA: Diagnosis not present

## 2018-07-14 DIAGNOSIS — IMO0002 Reserved for concepts with insufficient information to code with codable children: Secondary | ICD-10-CM

## 2018-07-14 DIAGNOSIS — E118 Type 2 diabetes mellitus with unspecified complications: Secondary | ICD-10-CM

## 2018-07-14 NOTE — Patient Instructions (Signed)
Goals  Follow My Plate  Eat three meals a day at times discussed Don't skip meals Eat 2-3 carb choices per meal  Take insulin as discussed. Inject air into vial first before drawing out 45 units of insulin. Drink only water  Don't eat past 7 pm Call if BS below 70 more than 1 a week. Keep appt with Dr.Niad 08-24-18 at 130 pm. Bring meter and blood sugar logs to visit.

## 2018-07-14 NOTE — Progress Notes (Addendum)
Was seeing provider at [facility]. Medical Nutrition Therapy:  Appt start time: 0800 end time:  0900.   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  CKD Stg 3. She notes she is doing well from that. Lives with her husband. She does the cooking and shopping.  Never went to school but her siblings taught her how to write some and speak english. Understands english well enough without an interpreter she reports.  Has Charcot Foot.-R R Foot is in a boot.   Eats 3 meals and occasionally snack.  A1C 6/19 was 13.3%. Potassium has been high in the past but is WNL now after being seen in the ER. Has CKD STG 3 and scheduled to see Dr. Fausto Skillern soon for that.  Levermir  45 units BID. She is currently using the vial an syringe. She would benefit from using the insulin pens for greater accuracy and vision. Has blurry vision from high BS. Trulicty weekly. Was going to HD and now will be seeing Dr. Otilio Miu in [place], [place] for PCP. Dr. Wyline Mood is her cardiologist who referred her to [place]. Has been referred to see Dr. Fransico Him, Endocrinologist Appt 08-24-18. FBS  190-207 mg/dl, HS 646-803' mg/dl.   Has had low blood sugars in middle of night a month ago. But not having  having any issues right now in the last month or so. Motivated to work on changing lifestyle, food choices and exercise to improve weight, BS and reduce complications from DM. Expect very positive outcomes.     Preferred Learning Style:  Auditory  Visual  Hands on  Learning Readiness:  Ready  Change in progress   MEDICATIONS:    DIETARY INTAKE:  24-hr recall:  B ( AM): Bisuit, ham and egg, water, coffee splenda  Snk ( AM):   L ( PM): mashed potatoe, gravy an chicken, green beans, water Snk ( PM):  D ( PM): skipped;  Sandwich ham/cheese with mayo, water Snk ( PM):  Beverages: water  Usual physical activity: ADL due to foot in boot for charcot foot.  Estimated energy needs: 1200 calories 135 g carbohydrates 90 g  protein 33 g fat  Progress Towards Goal(s):  In progress.   Nutritional Diagnosis:  NB-1.1 Food and nutrition-related knowledge deficit As related to Diabetes.  As evidenced by A1C 13.3%.    Intervention:  Nutrition and Diabetes education provided on My Plate, CHO counting, meal planning, portion sizes, timing of meals, avoiding snacks between meals unless having a low blood sugar, target ranges for A1C and blood sugars, signs/symptoms and treatment of hyper/hypoglycemia, monitoring blood sugars, taking medications as prescribed, benefits of exercising 30 minutes per day and prevention of complications of DM. Marland Kitchen Goals  Follow My Plate  Eat three meals a day at times discussed Don't skip meals Eat 2-3 carb choices per meal  Take insulin as discussed. Inject air into vial first before drawing out 45 units of insulin. Drink only water  Don't eat past 7 pm Call if BS below 70 more than 1 a week. Keep appt with Dr.Niad 08-24-18 at 130 pm. Bring meter and blood sugar logs to visit.   Teaching Method Utilized:  Visual Auditory Hands on  Handouts given during visit include:  The Plate Method -spanish  Meal Pan Card Spanish   Barriers to learning/adherence to lifestyle change:  Language barrier - a little  Demonstrated degree of understanding via:  Teach Back   Monitoring/Evaluation:  Dietary intake, exercise, meal plannign, and  body weight in 1 month(s).

## 2018-08-23 ENCOUNTER — Ambulatory Visit: Payer: Medicaid Other | Admitting: Podiatry

## 2018-08-23 ENCOUNTER — Ambulatory Visit (INDEPENDENT_AMBULATORY_CARE_PROVIDER_SITE_OTHER): Payer: Medicaid Other

## 2018-08-23 DIAGNOSIS — M14671 Charcot's joint, right ankle and foot: Secondary | ICD-10-CM | POA: Diagnosis not present

## 2018-08-24 ENCOUNTER — Encounter: Payer: Medicaid Other | Attending: "Endocrinology | Admitting: Nutrition

## 2018-08-24 ENCOUNTER — Ambulatory Visit (INDEPENDENT_AMBULATORY_CARE_PROVIDER_SITE_OTHER): Payer: Medicaid Other | Admitting: "Endocrinology

## 2018-08-24 ENCOUNTER — Encounter: Payer: Self-pay | Admitting: "Endocrinology

## 2018-08-24 VITALS — BP 128/71 | HR 72 | Ht 63.0 in | Wt 237.0 lb

## 2018-08-24 DIAGNOSIS — E782 Mixed hyperlipidemia: Secondary | ICD-10-CM | POA: Diagnosis not present

## 2018-08-24 DIAGNOSIS — I10 Essential (primary) hypertension: Secondary | ICD-10-CM

## 2018-08-24 DIAGNOSIS — E1165 Type 2 diabetes mellitus with hyperglycemia: Secondary | ICD-10-CM

## 2018-08-24 DIAGNOSIS — E785 Hyperlipidemia, unspecified: Secondary | ICD-10-CM | POA: Insufficient documentation

## 2018-08-24 DIAGNOSIS — IMO0002 Reserved for concepts with insufficient information to code with codable children: Secondary | ICD-10-CM

## 2018-08-24 DIAGNOSIS — E1122 Type 2 diabetes mellitus with diabetic chronic kidney disease: Secondary | ICD-10-CM | POA: Diagnosis not present

## 2018-08-24 DIAGNOSIS — I129 Hypertensive chronic kidney disease with stage 1 through stage 4 chronic kidney disease, or unspecified chronic kidney disease: Secondary | ICD-10-CM | POA: Insufficient documentation

## 2018-08-24 DIAGNOSIS — E1159 Type 2 diabetes mellitus with other circulatory complications: Secondary | ICD-10-CM | POA: Diagnosis not present

## 2018-08-24 DIAGNOSIS — Z713 Dietary counseling and surveillance: Secondary | ICD-10-CM | POA: Insufficient documentation

## 2018-08-24 DIAGNOSIS — Z951 Presence of aortocoronary bypass graft: Secondary | ICD-10-CM | POA: Diagnosis not present

## 2018-08-24 DIAGNOSIS — N183 Chronic kidney disease, stage 3 (moderate): Secondary | ICD-10-CM | POA: Insufficient documentation

## 2018-08-24 DIAGNOSIS — I519 Heart disease, unspecified: Secondary | ICD-10-CM | POA: Insufficient documentation

## 2018-08-24 DIAGNOSIS — E118 Type 2 diabetes mellitus with unspecified complications: Secondary | ICD-10-CM

## 2018-08-24 DIAGNOSIS — E669 Obesity, unspecified: Secondary | ICD-10-CM

## 2018-08-24 MED ORDER — FUROSEMIDE 20 MG PO TABS: 20.0000 mg | ORAL_TABLET | Freq: Every day | ORAL | 3 refills | Status: AC

## 2018-08-24 NOTE — Progress Notes (Signed)
   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 She states she is doing well overall. She has been using the CAM boot as directed. There are no modifying factors noted. She denies any new complaints at this time. Patient is here for further evaluation and treatment.   Past Medical History:  Diagnosis Date  . Diabetic neuropathy (HCC)   . Dyspnea   . Dysrhythmia   . Headache   . Hyperkalemia   . Hyperlipidemia   . Hypertension   . Type 2 diabetes mellitus (HCC)      Physical Exam: General: The patient is alert and oriented x3 in no acute distress.  Dermatology: Hyperkeratotic, dystrophic nails noted 1-5 bilateral consistent with onychomycosis.  Skin is warm, dry and supple bilateral lower extremities. Negative for open lesions or macerations.  Vascular: Mild erythema and edema of the right foot. Palpable pedal pulses bilaterally. Capillary refill within normal limits.  Neurological: Epicritic and protective threshold diminished bilaterally.   Musculoskeletal Exam: Range of motion within normal limits to all pedal and ankle joints bilateral. Muscle strength 5/5 in all groups bilateral.   Radiographic exam: Mildly displaced comminuted fracture medial aspect of the navicular noted.  Mild cortical destruction throughout the midtarsal joint. Minimal change from prior X-Ray.   Assessment: 1.  Charcot neuroarthropathy right lower extremity - stable  2.  Onychomycosis 1-5 bilateral  Plan of Care:  1. Patient evaluated.  X-rays reviewed.  2. Continue wearing CAM boot on right lower extremity.  3. Prescription for DM shoes and insoles and [place] type brace to take to [facility].  4. Continue taking oral Lamisil daily.  5. Return to clinic in March 2020. Patient is leaving for [place] and [place] for four months.         Felecia Shelling, DPM [facility]  Dr.  Felecia Shelling, DPM    [address] [place], [place] [address]                Office [phone]  Fax [phone]

## 2018-08-24 NOTE — Progress Notes (Signed)
Endocrinology Consult Note       08/24/2018, 4:33 PM   Subjective:    Patient ID: Jill [name], female    DOB: July 27, 1961.  Brita Jurgensen is being seen in consultation for management of currently uncontrolled symptomatic diabetes requested by  [facility].   Past Medical History:  Diagnosis Date  . Diabetic neuropathy (HCC)   . Dyspnea   . Dysrhythmia   . Headache   . Hyperkalemia   . Hyperlipidemia   . Hypertension   . Type 2 diabetes mellitus (HCC)    Past Surgical History:  Procedure Laterality Date  . CORONARY ARTERY BYPASS GRAFT N/A 07/22/2017   Procedure: CORONARY ARTERY BYPASS GRAFTING (CABG) x four, using left internal mammary artery and right leg greater saphenous vein harvested endoscopically;  Surgeon: Loreli Slot, MD;  Location: [facility] OR;  Service: Open Heart Surgery;  Laterality: N/A;  . RIGHT/LEFT HEART CATH AND CORONARY ANGIOGRAPHY N/A 06/26/2017   Procedure: RIGHT/LEFT HEART CATH AND CORONARY ANGIOGRAPHY;  Surgeon: Marykay Lex, MD;  Location: [facility] INVASIVE CV LAB;  Service: Cardiovascular;  Laterality: N/A;  . TEE WITHOUT CARDIOVERSION N/A 06/25/2017   Procedure: TRANSESOPHAGEAL ECHOCARDIOGRAM (TEE);  Surgeon: Thurmon Fair, MD;  Location: [facility] ENDOSCOPY;  Service: Cardiovascular;  Laterality: N/A;  . TEE WITHOUT CARDIOVERSION N/A 07/22/2017   Procedure: TRANSESOPHAGEAL ECHOCARDIOGRAM (TEE);  Surgeon: Loreli Slot, MD;  Location: [facility] OR;  Service: Open Heart Surgery;  Laterality: N/A;  . TUBAL LIGATION     Social History   Socioeconomic History  . Marital status: Married    Spouse name: Not on file  . Number of children: Not on file  . Years of education: Not on file  . Highest education level: Not on file  Occupational History  . Not on file  Social Needs  . Financial resource strain: Not on file  . Food insecurity:    Worry: Not on file   Inability: Not on file  . Transportation needs:    Medical: Not on file    Non-medical: Not on file  Tobacco Use  . Smoking status: Never Smoker  . Smokeless tobacco: Never Used  Substance and Sexual Activity  . Alcohol use: No  . Drug use: No  . Sexual activity: Yes    Birth control/protection: Surgical  Lifestyle  . Physical activity:    Days per week: Not on file    Minutes per session: Not on file  . Stress: Not on file  Relationships  . Social connections:    Talks on phone: Not on file    Gets together: Not on file    Attends religious service: Not on file    Active member of club or organization: Not on file    Attends meetings of clubs or organizations: Not on file    Relationship status: Not on file  Other Topics Concern  . Not on file  Social History Narrative  . Not on file   Outpatient Encounter Medications as of 08/24/2018  Medication Sig  . acetaminophen (TYLENOL) 500 MG tablet Take 2 tablets (1,000 mg total) by mouth every 6 (six) hours  as needed.  Marland Kitchen aspirin EC 81 MG tablet Take 81 mg by mouth daily.  . Dulaglutide (TRULICITY) 0.75 MG/0.5ML SOPN Inject 0.75 mg into the skin every 7 (seven) days. tuesdays  . furosemide (LASIX) 20 MG tablet Take 1 tablet (20 mg total) by mouth daily with breakfast.  . gabapentin (NEURONTIN) 400 MG capsule Take 400 mg by mouth 3 (three) times daily as needed (foot pain).   Marland Kitchen ibuprofen (ADVIL,MOTRIN) 600 MG tablet Take 1 tablet (600 mg total) by mouth every 6 (six) hours as needed for moderate pain.  Marland Kitchen insulin detemir (LEVEMIR) 100 UNIT/ML injection Inject 40 Units into the skin at bedtime.  Marland Kitchen loratadine (CLARITIN) 10 MG tablet Take 10 mg by mouth daily.  . metoprolol succinate (TOPROL-XL) 25 MG 24 hr tablet Take 0.5 tablets (12.5 mg total) by mouth daily.  . midodrine (PROAMATINE) 5 MG tablet Take 1 tablet (5 mg total) by mouth 3 (three) times daily with meals.  . rosuvastatin (CRESTOR) 20 MG tablet Take 1 tablet (20 mg total) by  mouth at bedtime.  . silver sulfADIAZINE (SILVADENE) 1 % cream Apply 1 application topically daily.  Marland Kitchen terbinafine (LAMISIL) 250 MG tablet Take 1 tablet (250 mg total) by mouth daily.  . traMADol (ULTRAM) 50 MG tablet Take 1 tablet (50 mg total) by mouth every 6 (six) hours as needed.  . vitamin C (ASCORBIC ACID) 500 MG tablet Take 500 mg by mouth daily.   No facility-administered encounter medications on file as of 08/24/2018.     ALLERGIES: No Known Allergies  VACCINATION STATUS: Immunization History  Administered Date(s) Administered  . Pneumococcal Polysaccharide-23 03/07/2016    Diabetes  She presents for her initial diabetic visit. She has type 2 diabetes mellitus. Onset time: She was diagnosed at approximate age of 57 years. Her disease course has been worsening. Hypoglycemia symptoms include headaches. Pertinent negatives for hypoglycemia include no confusion, pallor or seizures. Associated symptoms include blurred vision, fatigue, foot paresthesias, polydipsia and polyphagia. Pertinent negatives for diabetes include no chest pain and no polyuria. There are no hypoglycemic complications. Symptoms are worsening. Diabetic complications include heart disease, peripheral neuropathy and PVD. Risk factors for coronary artery disease include diabetes mellitus, dyslipidemia, obesity, hypertension, sedentary lifestyle and post-menopausal. Current diabetic treatment includes insulin injections. Her weight is increasing steadily. She is following a generally unhealthy diet. When asked about meal planning, she reported none. She has had a previous visit with a dietitian. She never participates in exercise. (She did not bring any meter nor logs to review.  Her most recent A1c was 14%.) She sees a podiatrist. Hyperlipidemia  This is a chronic problem. The current episode started more than 1 year ago. Exacerbating diseases include diabetes and obesity. Pertinent negatives include no chest pain, myalgias  or shortness of breath. Risk factors for coronary artery disease include diabetes mellitus, dyslipidemia, hypertension, obesity, post-menopausal and a sedentary lifestyle.  Hypertension  This is a chronic problem. The problem is controlled. Associated symptoms include blurred vision and headaches. Pertinent negatives include no chest pain, palpitations or shortness of breath. Risk factors for coronary artery disease include dyslipidemia, diabetes mellitus and sedentary lifestyle. Past treatments include beta blockers. Compliance problems include psychosocial issues.  Hypertensive end-organ damage includes heart failure and PVD.      Review of Systems  Constitutional: Positive for fatigue. Negative for chills, fever and unexpected weight change.  HENT: Negative for trouble swallowing and voice change.   Eyes: Positive for blurred vision. Negative for visual disturbance.  Respiratory: Negative for cough, shortness of breath and wheezing.   Cardiovascular: Negative for chest pain, palpitations and leg swelling.  Gastrointestinal: Negative for diarrhea, nausea and vomiting.  Endocrine: Positive for polydipsia and polyphagia. Negative for cold intolerance, heat intolerance and polyuria.  Musculoskeletal: Positive for gait problem. Negative for arthralgias and myalgias.       She has Charcot's foot on the right.  Skin: Negative for color change, pallor, rash and wound.  Neurological: Positive for headaches. Negative for seizures.  Psychiatric/Behavioral: Negative for confusion and suicidal ideas.    Objective:    BP 128/71   Pulse 72   Ht 5\' 3"  (1.6 m)   Wt 237 lb (107.5 kg)   SpO2 97%   BMI 41.98 kg/m   Wt Readings from Last 3 Encounters:  08/24/18 237 lb (107.5 kg)  07/14/18 222 lb (100.7 kg)  07/07/18 260 lb (117.9 kg)     Physical Exam  Constitutional: She is oriented to person, place, and time. She appears well-developed.  HENT:  Head: Normocephalic and atraumatic.  Eyes: EOM  are normal.  Neck: Normal range of motion. Neck supple. No tracheal deviation present. No thyromegaly present.  Cardiovascular: Normal rate and regular rhythm.  Pulmonary/Chest: Effort normal and breath sounds normal.  Abdominal: Soft. Bowel sounds are normal. There is no tenderness. There is no guarding.  Musculoskeletal: She exhibits edema and deformity.  She has bilateral pedal and tibial edema and Charcot deformity on the right lower extremity.  Neurological: She is alert and oriented to person, place, and time. She has normal reflexes. No cranial nerve deficit. Coordination normal.  Skin: Skin is warm and dry. No rash noted. No erythema. No pallor.  Psychiatric: She has a normal mood and affect. Judgment normal.  She has language barrier.  She speaks [place], some [place].    CMP     Component Value Date/Time   NA 143 07/07/2018 1520   NA 143 07/06/2018 0924   K 4.3 07/07/2018 1520   CL 110 07/07/2018 1520   CO2 28 07/07/2018 1207   GLUCOSE 196 (H) 07/07/2018 1520   BUN 28 (H) 07/07/2018 1520   BUN 36 (H) 07/06/2018 0924   CREATININE 1.10 (H) 07/07/2018 1520   CALCIUM 8.4 (L) 07/07/2018 1207   PROT 7.2 07/12/2018 0938   ALBUMIN 3.4 (L) 06/01/2018 1100   AST 19 07/12/2018 0938   ALT 18 07/12/2018 0938   ALKPHOS 90 06/01/2018 1100   BILITOT 0.3 07/12/2018 0938   GFRNONAA 46 (L) 07/07/2018 1207   GFRAA 53 (L) 07/07/2018 1207   Diabetic Labs (most recent): Lab Results  Component Value Date   HGBA1C 14.0 (H) 04/29/2018   HGBA1C 9.1 (H) 07/20/2017   HGBA1C 10.4 (H) 06/18/2017     Lipid Panel ( most recent) Lipid Panel     Component Value Date/Time   CHOL 156 04/29/2018 1509   TRIG 123 04/29/2018 1509   HDL 47 04/29/2018 1509   CHOLHDL 3.3 04/29/2018 1509   VLDL 25 04/29/2018 1509   LDLCALC 84 04/29/2018 1509      Lab Results  Component Value Date   TSH 1.984 04/29/2018      Assessment & Plan:   1. DM type 2 causing vascular disease (HCC)  - Janaiya Beauchesne has currently uncontrolled symptomatic type 2 DM since 57 years of age,  with most recent A1c of 14 %.   She will be sent for new labs before her next visit.  Recent labs reviewed.  -  her diabetes is complicated by CHF, Charcot's deformity on the right lower extremity, obesity/sedentary life, and she remains at extremely high risk for more acute and chronic complications which include CAD, CVA, CKD, retinopathy, and neuropathy. These are all discussed in detail with her.  - I have counseled her on diet management and weight loss, by adopting a carbohydrate restricted/protein rich diet.  - Suggestion is made for her to avoid simple carbohydrates  from her diet including Cakes, Sweet Desserts, Ice Cream, Soda (diet and regular), Sweet Tea, Candies, Chips, Cookies, Store Bought Juices, Alcohol in Excess of  1-2 drinks a day, Artificial Sweeteners,  Coffee Creamer, and "Sugar-free" Products. This will help patient to have more stable blood glucose profile and potentially avoid unintended weight gain.  - I encouraged her to switch to  unprocessed or minimally processed complex starch and increased protein intake (animal or plant source), fruits, and vegetables.  - she is advised to stick to a routine mealtimes to eat 3 meals  a day and avoid unnecessary snacks ( to snack only to correct hypoglycemia).   - she will be scheduled with Norm Salt, RDN, CDE for individualized diabetes education.  - I have approached her with the following individualized plan to manage diabetes and patient agrees:   -She may need intensive treatment with basal/bolus insulin in order for her to achieve and maintain control of diabetes to target.   -In preparation, I approached her to start monitoring blood glucose 4 times a day-daily before breakfast, lunch, supper, and at bedtime and return in 2 weeks with her meter and logs for reevaluation and treatment adjustment.    -She complains of random dropping of blood  glucose, in the meantime I advised her to lower her Levemir to 40 units only at bedtime.   - she is warned not to take insulin without proper monitoring per orders. - she is encouraged to call clinic for blood glucose levels less than 70 or above 300 mg /dl. - I will continue Trulicity 0.75 mg subcutaneously, therapeutically suitable for patient .  - Patient specific target  A1c;  LDL, HDL, Triglycerides, and  Waist Circumference were discussed in detail.  2) BP/HTN:  her blood pressure is controlled to target.   she is advised to continue her current medications including metoprolol 12.5 mg p.o. twice daily. -Given her bilateral lower extremity edema, I discussed and initiated furosemide 20 mg p.o. every morning. 3) Lipids/HPL:   Review of her recent lipid panel showed  controlled  LDL at 84 .  she  is advised to continue    Crestor 20 mg daily at bedtime.  Side effects and precautions discussed with her.  4)  Weight/Diet:  Body mass index is 41.98 kg/m.  - clearly complicating her diabetes care.  I discussed with her the fact that loss of 5 - 10% of her  current body weight will have the most impact on her diabetes management.  CDE Consult will be initiated . Exercise, and detailed carbohydrates information provided  -  detailed on discharge instructions.  5) Chronic Care/Health Maintenance:  -she  Is on Statin medications and  is encouraged to initiate and continue to follow up with Ophthalmology, Dentist,  Podiatrist at least yearly or according to recommendations, and advised to  stay away from smoking. I have recommended yearly flu vaccine and pneumonia vaccine at least every 5 years; moderate intensity exercise for up to 150 minutes weekly; and  sleep for at least 7 hours a  day.  - I advised patient to maintain close follow up with [facility] for primary care needs.  - Time spent with the patient: 45 minutes, of which >50% was spent in obtaining information about her  symptoms, reviewing her previous labs, evaluations, and treatments, counseling her about her chronically uncontrolled type 2 diabetes, hyperlipidemia, hypertension, and developing plans for long term treatment based on the latest recommendations.  Drue Dun participated in the discussions, expressed understanding, and voiced agreement with the above plans.  However, she will need language interpreter from [place] to Spanish during her subsequent visits.   she is encouraged to contact clinic should she have any questions or concerns prior to her return visit.  Follow up plan: - Return in about 2 weeks (around 09/07/2018) for Follow up with Meter and Logs Only - no Labs.  Marquis Lunch, MD [facility] [address] [place], [place] [address] Phone: [phone]  Fax: [phone]    08/24/2018, 4:33 PM  This note was partially dictated with voice recognition software. Similar sounding words can be transcribed inadequately or may not  be corrected upon review.

## 2018-08-24 NOTE — Progress Notes (Signed)
Was seeing provider at [facility]. 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  CKD Stg 3. She notes she is doing well from that. Lives with her husband. She does the cooking and shopping.  Never went to school but her siblings taught her how to write some and speak english. Understands english well enough without an interpreter she reports.  Levemir 55 units at night and 45 units in am.  Janumet 1 at night and 1 in am.  Trulicity 1 once a week. Has swelling in right foot with charco foot. Gained 15 lbs since last visit in Sept. Has swelling in right foot with foot brace. Doesn't have a PCP. To see Dr. Fransico Him today.  FBS range 180-220's in am .  BS 180-200's at bedtime. Had BS In 60's twice last week in middle night.  Working on eating better balanced meals and eating at times recommended. She notes she is going to [place] for a month and needs prescriptions filled and needs diabetic shoes. She notes her right foot is fractured. Sees Ortho.  Her current intake is insuffient to meet her needs and contribute to poorly controlled blood sugars. ONly eating 1-2 meals per day. Wt Readings from Last 3 Encounters:  08/24/18 237 lb (107.5 kg)  07/14/18 222 lb (100.7 kg)  07/07/18 260 lb (117.9 kg)   Ht Readings from Last 3 Encounters:  08/24/18 5\' 3"  (1.6 m)  07/14/18 5\' 2"  (1.575 m)  07/07/18 5\' 2"  (1.575 m)   There is no height or weight on file to calculate BMI. @BMIFA @ Facility age limit for growth percentiles is 20 years. Facility age limit for growth percentiles is 20 years.  ------------   Has Charcot Foot.-R R Foot is in a boot.   Eats 3 meals and occasionally snack.  A1C 6/19 was 13.3%. Potassium has been high in the past but is WNL now after being seen in the ER. Has CKD STG 3 and scheduled to see Dr. Fausto Skillern soon for that.  Levermir  45 units BID. She is currently using the vial an syringe. She would  benefit from using the insulin pens for greater accuracy and vision. Has blurry vision from high BS. Trulicty weekly. Was going to [place] and now will be seeing Dr. Otilio Miu in [place], [place] for PCP. Dr. Wyline Mood is her cardiologist who referred her to [place]. Has been referred to see Dr. Fransico Him, Endocrinologist Appt 08-24-18. FBS  190-207 mg/dl, HS 997-741' mg/dl.   Has had low blood sugars in middle of night a month ago. But not having  having any issues right now in the last month or so. Motivated to work on changing lifestyle, food choices and exercise to improve weight, BS and reduce complications from DM. Expect very positive outcomes.     Preferred Learning Style:  Auditory  Visual  Hands on  Learning Readiness:  Ready  Change in progress   MEDICATIONS:    DIETARY INTAKE:  24-hr recall:  B ( AM):  English muffin with butter and jelly, coffee- splenda Snk ( AM):   L ( PM):  Hasn't eaten lunch today:  Chicken grilled chips and salsa and water Snk ( PM):  D ( PM):skipped.  Snk ( PM): peach Beverages: water  Usual physical activity: ADL due to foot in boot for charcot foot.  Estimated energy needs: 1200 calories 135 g carbohydrates 90 g protein 33 g fat  Progress Towards Goal(s):  In progress.   Nutritional Diagnosis:  NB-1.1 Food and nutrition-related knowledge deficit As related to Diabetes.  As evidenced by A1C 13.3%.    Intervention:  Nutrition and Diabetes education provided on My Plate, CHO counting, meal planning, portion sizes, timing of meals, avoiding snacks between meals unless having a low blood sugar, target ranges for A1C and blood sugars, signs/symptoms and treatment of hyper/hypoglycemia, monitoring blood sugars, taking medications as prescribed, benefits of exercising 30 minutes per day and prevention of complications of DM. Marland Kitchen Eat 3 meals per day Take insulin as prescribed Increase lower carb vegetables Eat 2-3 carb choices per meal. Drink water.    Teaching  Method Utilized:  Visual Auditory Hands on  Handouts given during visit include:  The Plate Method -spanish  Meal Pan Card Spanish   Barriers to learning/adherence to lifestyle change:  Language barrier - a little  Demonstrated degree of understanding via:  Teach Back   Monitoring/Evaluation:  Dietary intake, exercise, meal plannign, and body weight in 3 month(s).

## 2018-08-24 NOTE — Patient Instructions (Signed)

## 2018-08-25 ENCOUNTER — Encounter: Payer: Self-pay | Admitting: Nutrition

## 2018-08-25 NOTE — Patient Instructions (Signed)
Eat 3 meals per day Take insulin as prescribed Increase lower carb vegetables Eat 2-3 carb choices per meal. Drink water.

## 2018-08-31 ENCOUNTER — Ambulatory Visit: Payer: Self-pay | Admitting: Nutrition

## 2018-09-03 ENCOUNTER — Other Ambulatory Visit: Payer: Self-pay | Admitting: "Endocrinology

## 2018-09-04 LAB — COMPREHENSIVE METABOLIC PANEL
ALT: 39 IU/L — AB (ref 0–32)
AST: 26 IU/L (ref 0–40)
Albumin/Globulin Ratio: 1.1 — ABNORMAL LOW (ref 1.2–2.2)
Albumin: 3.7 g/dL (ref 3.5–5.5)
Alkaline Phosphatase: 104 IU/L (ref 39–117)
BILIRUBIN TOTAL: 0.3 mg/dL (ref 0.0–1.2)
BUN/Creatinine Ratio: 24 — ABNORMAL HIGH (ref 9–23)
BUN: 29 mg/dL — AB (ref 6–24)
CALCIUM: 8.4 mg/dL — AB (ref 8.7–10.2)
CHLORIDE: 106 mmol/L (ref 96–106)
CO2: 21 mmol/L (ref 20–29)
Creatinine, Ser: 1.2 mg/dL — ABNORMAL HIGH (ref 0.57–1.00)
GFR calc Af Amer: 58 mL/min/{1.73_m2} — ABNORMAL LOW (ref >59)
GFR calc non Af Amer: 50 mL/min/{1.73_m2} — ABNORMAL LOW (ref >59)
Globulin, Total: 3.4 g/dL (ref 1.5–4.5)
Glucose: 256 mg/dL — ABNORMAL HIGH (ref 65–99)
Potassium: 5.4 mmol/L — ABNORMAL HIGH (ref 3.5–5.2)
Sodium: 140 mmol/L (ref 134–144)
TOTAL PROTEIN: 7.1 g/dL (ref 6.0–8.5)

## 2018-09-04 LAB — MICROALBUMIN / CREATININE URINE RATIO
Creatinine, Urine: 119 mg/dL
MICROALB/CREAT RATIO: 379.8 mg/g{creat} — AB (ref 0.0–30.0)
MICROALBUM., U, RANDOM: 452 ug/mL

## 2018-09-04 LAB — SPECIMEN STATUS REPORT

## 2018-09-04 LAB — VITAMIN D 25 HYDROXY (VIT D DEFICIENCY, FRACTURES): Vit D, 25-Hydroxy: 15 ng/mL — ABNORMAL LOW (ref 30.0–100.0)

## 2018-09-04 LAB — T4, FREE: Free T4: 1.39 ng/dL (ref 0.82–1.77)

## 2018-09-04 LAB — HGB A1C W/O EAG: HEMOGLOBIN A1C: 9.5 % — AB (ref 4.8–5.6)

## 2018-09-04 LAB — TSH: TSH: 3.88 u[IU]/mL (ref 0.450–4.500)

## 2018-09-06 ENCOUNTER — Encounter: Payer: Self-pay | Admitting: Nutrition

## 2018-09-06 ENCOUNTER — Ambulatory Visit (INDEPENDENT_AMBULATORY_CARE_PROVIDER_SITE_OTHER): Payer: Medicaid Other | Admitting: "Endocrinology

## 2018-09-06 ENCOUNTER — Encounter: Payer: Medicaid Other | Attending: "Endocrinology | Admitting: Nutrition

## 2018-09-06 VITALS — Ht 62.0 in | Wt 237.0 lb

## 2018-09-06 VITALS — BP 132/70 | HR 82 | Ht 63.0 in | Wt 237.0 lb

## 2018-09-06 DIAGNOSIS — I10 Essential (primary) hypertension: Secondary | ICD-10-CM | POA: Diagnosis not present

## 2018-09-06 DIAGNOSIS — I130 Hypertensive heart and chronic kidney disease with heart failure and stage 1 through stage 4 chronic kidney disease, or unspecified chronic kidney disease: Secondary | ICD-10-CM | POA: Insufficient documentation

## 2018-09-06 DIAGNOSIS — Z713 Dietary counseling and surveillance: Secondary | ICD-10-CM | POA: Diagnosis present

## 2018-09-06 DIAGNOSIS — E559 Vitamin D deficiency, unspecified: Secondary | ICD-10-CM | POA: Diagnosis not present

## 2018-09-06 DIAGNOSIS — Z6841 Body Mass Index (BMI) 40.0 and over, adult: Secondary | ICD-10-CM | POA: Insufficient documentation

## 2018-09-06 DIAGNOSIS — Z951 Presence of aortocoronary bypass graft: Secondary | ICD-10-CM | POA: Diagnosis not present

## 2018-09-06 DIAGNOSIS — E782 Mixed hyperlipidemia: Secondary | ICD-10-CM | POA: Insufficient documentation

## 2018-09-06 DIAGNOSIS — E1165 Type 2 diabetes mellitus with hyperglycemia: Secondary | ICD-10-CM

## 2018-09-06 DIAGNOSIS — N183 Chronic kidney disease, stage 3 (moderate): Secondary | ICD-10-CM | POA: Insufficient documentation

## 2018-09-06 DIAGNOSIS — E1122 Type 2 diabetes mellitus with diabetic chronic kidney disease: Secondary | ICD-10-CM | POA: Diagnosis not present

## 2018-09-06 DIAGNOSIS — E1159 Type 2 diabetes mellitus with other circulatory complications: Secondary | ICD-10-CM | POA: Diagnosis not present

## 2018-09-06 DIAGNOSIS — E118 Type 2 diabetes mellitus with unspecified complications: Secondary | ICD-10-CM

## 2018-09-06 DIAGNOSIS — I509 Heart failure, unspecified: Secondary | ICD-10-CM | POA: Diagnosis not present

## 2018-09-06 DIAGNOSIS — IMO0002 Reserved for concepts with insufficient information to code with codable children: Secondary | ICD-10-CM

## 2018-09-06 MED ORDER — VITAMIN D3 125 MCG (5000 UT) PO CAPS: 5000.0000 [IU] | ORAL_CAPSULE | Freq: Every day | ORAL | 0 refills | Status: AC

## 2018-09-06 MED ORDER — DULAGLUTIDE 1.5 MG/0.5ML ~~LOC~~ SOAJ: 1.5000 mg | SUBCUTANEOUS | 2 refills | Status: DC

## 2018-09-06 NOTE — Progress Notes (Signed)
Was seeing provider at [facility]. 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  CKD Stg 3. She notes she is doing well from that. Lives with her husband. She does the cooking and shopping. Wt stable. Eating more vegetables, drinking more water.  Has been following meal planning recommendations. FBS 58-90's  Mg/dl. Evenings 200-300's Swelling better in feet. Feels better. Has had episodes of low blood sugars and treats it and feels better afterwards. To see Dr. Fransico Him today. Got lab work done on Friday. Working on eating meals on time. Skips some meals due to not being hungry.  Has Charcot Foot.-R. Needs diabetic shoes. Saw Ortho. Sees Dr. Otilio Miu in [place]. Speaks english some.  A1C down down to 9.5%. BS improving well. Unable to exerise due to foot issues.  Lab Results  Component Value Date   HGBA1C 9.5 (H) 09/03/2018   CMP Latest Ref Rng & Units 09/03/2018 07/12/2018 07/07/2018  Glucose 65 - 99 mg/dL 093(O) - 671(I)  BUN 6 - 24 mg/dL 45(Y) - 09(X)  Creatinine 0.57 - 1.00 mg/dL 8.33(A) - 2.50(N)  Sodium 134 - 144 mmol/L 140 - 143  Potassium 3.5 - 5.2 mmol/L 5.4(H) - 4.3  Chloride 96 - 106 mmol/L 106 - 110  CO2 20 - 29 mmol/L 21 - -  Calcium 8.7 - 10.2 mg/dL 3.9(J) - -  Total Protein 6.0 - 8.5 g/dL 7.1 7.2 -  Total Bilirubin 0.0 - 1.2 mg/dL 0.3 0.3 -  Alkaline Phos 39 - 117 IU/L 104 - -  AST 0 - 40 IU/L 26 19 -  ALT 0 - 32 IU/L 39(H) 18 -   Lipid Panel     Component Value Date/Time   CHOL 156 04/29/2018 1509   TRIG 123 04/29/2018 1509   HDL 47 04/29/2018 1509   CHOLHDL 3.3 04/29/2018 1509   VLDL 25 04/29/2018 1509   LDLCALC 84 04/29/2018 1509      Her current intake is insuffient to meet her needs and contribute to poorly controlled blood sugars. Wt Readings from Last 3 Encounters:  08/24/18 237 lb (107.5 kg)  07/14/18 222 lb (100.7 kg)  07/07/18 260 lb (117.9 kg)   Ht Readings from  Last 3 Encounters:  08/24/18 5\' 3"  (1.6 m)  07/14/18 5\' 2"  (1.575 m)  07/07/18 5\' 2"  (1.575 m)   There is no height or weight on file to calculate BMI. @BMIFA @ Facility age limit for growth percentiles is 20 years. Facility age limit for growth percentiles is 20 years.  ------------    Preferred Learning Style:  Auditory  Visual  Hands on  Learning Readiness:  Ready  Change in progress   MEDICATIONS:    DIETARY INTAKE:  24-hr recall:  B ( AM): pancake and coffee, or eggs and toast, water Snk ( AM):   L ( PM): beef stew,  Green beans, caulflowerc,  Snk ( PM):  D ( PM):skipped.  Snk ( PM): peach Beverages: water  Usual physical activity: ADL due to foot in boot for charcot foot.  Estimated energy needs: 1200 calories 135 g carbohydrates 90 g protein 33 g fat  Progress Towards Goal(s):  In progress.   Nutritional Diagnosis:  NB-1.1 Food and nutrition-related knowledge deficit As related to Diabetes.  As evidenced by A1C 13.3%.    Intervention:  Nutrition and Diabetes education provided on My Plate, CHO counting, meal planning, portion sizes, timing of meals, avoiding  snacks between meals unless having a low blood sugar, target ranges for A1C and blood sugars, signs/symptoms and treatment of hyper/hypoglycemia, monitoring blood sugars, taking medications as prescribed, benefits of exercising 30 minutes per day and prevention of complications of DM. Marland Kitchen  Goals 1. Don't skip meals 2. Eat breakfast by 8 am, lunch 12-2, Dinner 5-7 pm 3. Increase vegetables with lunch and dinner  Eat 2 pieces of fruit a day.   Teaching Method Utilized:  Visual Auditory Hands on  Handouts given during visit include:  The Plate Method -spanish  Meal Pan Card Spanish   Barriers to learning/adherence to lifestyle change:  Language barrier - a little  Demonstrated degree of understanding via:  Teach Back   Monitoring/Evaluation:  Dietary intake, exercise, meal plannign,  and body weight in 3 month(s).

## 2018-09-06 NOTE — Patient Instructions (Signed)

## 2018-09-06 NOTE — Progress Notes (Signed)
Endocrinology follow-up  Note       09/06/2018, 6:33 PM   Subjective:    Patient ID: Jill [name], female    DOB: 1960/12/08.  Jill [name] is being seen in follow-up  for management of currently uncontrolled symptomatic diabetes requested by  [facility].   Past Medical History:  Diagnosis Date  . Diabetic neuropathy (HCC)   . Dyspnea   . Dysrhythmia   . Headache   . Hyperkalemia   . Hyperlipidemia   . Hypertension   . Type 2 diabetes mellitus (HCC)    Past Surgical History:  Procedure Laterality Date  . CORONARY ARTERY BYPASS GRAFT N/A 07/22/2017   Procedure: CORONARY ARTERY BYPASS GRAFTING (CABG) x four, using left internal mammary artery and right leg greater saphenous vein harvested endoscopically;  Surgeon: Loreli Slot, MD;  Location: [facility] OR;  Service: Open Heart Surgery;  Laterality: N/A;  . RIGHT/LEFT HEART CATH AND CORONARY ANGIOGRAPHY N/A 06/26/2017   Procedure: RIGHT/LEFT HEART CATH AND CORONARY ANGIOGRAPHY;  Surgeon: Marykay Lex, MD;  Location: [facility] INVASIVE CV LAB;  Service: Cardiovascular;  Laterality: N/A;  . TEE WITHOUT CARDIOVERSION N/A 06/25/2017   Procedure: TRANSESOPHAGEAL ECHOCARDIOGRAM (TEE);  Surgeon: Thurmon Fair, MD;  Location: [facility] ENDOSCOPY;  Service: Cardiovascular;  Laterality: N/A;  . TEE WITHOUT CARDIOVERSION N/A 07/22/2017   Procedure: TRANSESOPHAGEAL ECHOCARDIOGRAM (TEE);  Surgeon: Loreli Slot, MD;  Location: [facility] OR;  Service: Open Heart Surgery;  Laterality: N/A;  . TUBAL LIGATION     Social History   Socioeconomic History  . Marital status: Married    Spouse name: Not on file  . Number of children: Not on file  . Years of education: Not on file  . Highest education level: Not on file  Occupational History  . Not on file  Social Needs  . Financial resource strain: Not on file  . Food insecurity:    Worry: Not on file     Inability: Not on file  . Transportation needs:    Medical: Not on file    Non-medical: Not on file  Tobacco Use  . Smoking status: Never Smoker  . Smokeless tobacco: Never Used  Substance and Sexual Activity  . Alcohol use: No  . Drug use: No  . Sexual activity: Yes    Birth control/protection: Surgical  Lifestyle  . Physical activity:    Days per week: Not on file    Minutes per session: Not on file  . Stress: Not on file  Relationships  . Social connections:    Talks on phone: Not on file    Gets together: Not on file    Attends religious service: Not on file    Active member of club or organization: Not on file    Attends meetings of clubs or organizations: Not on file    Relationship status: Not on file  Other Topics Concern  . Not on file  Social History Narrative  . Not on file   Outpatient Encounter Medications as of 09/06/2018  Medication Sig  . acetaminophen (TYLENOL) 500 MG tablet Take 2 tablets (1,000 mg total) by mouth  every 6 (six) hours as needed.  Marland Kitchen aspirin EC 81 MG tablet Take 81 mg by mouth daily.  . Cholecalciferol (VITAMIN D3) 125 MCG (5000 UT) CAPS Take 1 capsule (5,000 Units total) by mouth daily.  . Dulaglutide (TRULICITY) 0.75 MG/0.5ML SOPN Inject 0.75 mg into the skin every 7 (seven) days. tuesdays  . furosemide (LASIX) 20 MG tablet Take 1 tablet (20 mg total) by mouth daily with breakfast.  . gabapentin (NEURONTIN) 400 MG capsule Take 400 mg by mouth 3 (three) times daily as needed (foot pain).   Marland Kitchen ibuprofen (ADVIL,MOTRIN) 600 MG tablet Take 1 tablet (600 mg total) by mouth every 6 (six) hours as needed for moderate pain.  Marland Kitchen insulin detemir (LEVEMIR) 100 UNIT/ML injection Inject 45 Units into the skin at bedtime.   Marland Kitchen loratadine (CLARITIN) 10 MG tablet Take 10 mg by mouth daily.  . metoprolol succinate (TOPROL-XL) 25 MG 24 hr tablet Take 0.5 tablets (12.5 mg total) by mouth daily.  . midodrine (PROAMATINE) 5 MG tablet Take 1 tablet (5 mg total)  by mouth 3 (three) times daily with meals.  . rosuvastatin (CRESTOR) 20 MG tablet Take 1 tablet (20 mg total) by mouth at bedtime.  . silver sulfADIAZINE (SILVADENE) 1 % cream Apply 1 application topically daily.  Marland Kitchen terbinafine (LAMISIL) 250 MG tablet Take 1 tablet (250 mg total) by mouth daily.  . traMADol (ULTRAM) 50 MG tablet Take 1 tablet (50 mg total) by mouth every 6 (six) hours as needed.  . vitamin C (ASCORBIC ACID) 500 MG tablet Take 500 mg by mouth daily.   No facility-administered encounter medications on file as of 09/06/2018.     ALLERGIES: No Known Allergies  VACCINATION STATUS: Immunization History  Administered Date(s) Administered  . Pneumococcal Polysaccharide-23 03/07/2016    Diabetes  She presents for her follow-up diabetic visit. She has type 2 diabetes mellitus. Onset time: She was diagnosed at approximate age of 35 years. Her disease course has been improving. Hypoglycemia symptoms include headaches. Pertinent negatives for hypoglycemia include no confusion, pallor or seizures. Associated symptoms include blurred vision, fatigue, foot paresthesias, polydipsia and polyphagia. Pertinent negatives for diabetes include no chest pain and no polyuria. There are no hypoglycemic complications. Symptoms are improving. Diabetic complications include heart disease, peripheral neuropathy and PVD. Risk factors for coronary artery disease include diabetes mellitus, dyslipidemia, obesity, hypertension, sedentary lifestyle and post-menopausal. Current diabetic treatment includes insulin injections. Her weight is increasing steadily. She is following a generally unhealthy diet. When asked about meal planning, she reported none. She has had a previous visit with a dietitian. She never participates in exercise. Her breakfast blood glucose range is generally 70-90 mg/dl. Her bedtime blood glucose range is generally >200 mg/dl. Her overall blood glucose range is >200 mg/dl. (She returns with a  A1c of 9.5% improving from 14%.  She did not follow the insulin recommendations given to her during her last visit.  ) She sees a podiatrist. Hyperlipidemia  This is a chronic problem. The current episode started more than 1 year ago. Exacerbating diseases include diabetes and obesity. Pertinent negatives include no chest pain, myalgias or shortness of breath. Risk factors for coronary artery disease include diabetes mellitus, dyslipidemia, hypertension, obesity, post-menopausal and a sedentary lifestyle.  Hypertension  This is a chronic problem. The problem is controlled. Associated symptoms include blurred vision and headaches. Pertinent negatives include no chest pain, palpitations or shortness of breath. Risk factors for coronary artery disease include dyslipidemia, diabetes mellitus and sedentary lifestyle.  Past treatments include beta blockers. Compliance problems include psychosocial issues.  Hypertensive end-organ damage includes heart failure and PVD.    Review of Systems  Constitutional: Positive for fatigue. Negative for chills, fever and unexpected weight change.  HENT: Negative for trouble swallowing and voice change.   Eyes: Positive for blurred vision. Negative for visual disturbance.  Respiratory: Negative for cough, shortness of breath and wheezing.   Cardiovascular: Negative for chest pain, palpitations and leg swelling.  Gastrointestinal: Negative for diarrhea, nausea and vomiting.  Endocrine: Positive for polydipsia and polyphagia. Negative for cold intolerance, heat intolerance and polyuria.  Musculoskeletal: Positive for gait problem. Negative for arthralgias and myalgias.       She has Charcot's foot on the right.  Skin: Negative for color change, pallor, rash and wound.  Neurological: Positive for headaches. Negative for seizures.  Psychiatric/Behavioral: Negative for confusion and suicidal ideas.    Objective:    BP 132/70   Pulse 82   Ht 5\' 3"  (1.6 m)   Wt 237 lb  (107.5 kg)   BMI 41.98 kg/m   Wt Readings from Last 3 Encounters:  09/06/18 237 lb (107.5 kg)  09/06/18 237 lb (107.5 kg)  08/24/18 237 lb (107.5 kg)     Physical Exam  Constitutional: She is oriented to person, place, and time. She appears well-developed.  HENT:  Head: Normocephalic and atraumatic.  Eyes: EOM are normal.  Neck: Normal range of motion. Neck supple. No tracheal deviation present. No thyromegaly present.  Cardiovascular: Normal rate and regular rhythm.  Pulmonary/Chest: Effort normal and breath sounds normal.  Abdominal: Soft. Bowel sounds are normal. There is no tenderness. There is no guarding.  Musculoskeletal: She exhibits edema and deformity.  She has bilateral pedal and tibial edema and Charcot deformity on the right lower extremity.  Neurological: She is alert and oriented to person, place, and time. She has normal reflexes. No cranial nerve deficit. Coordination normal.  Skin: Skin is warm and dry. No rash noted. No erythema. No pallor.  Psychiatric: She has a normal mood and affect. Judgment normal.  She has language barrier.  She speaks [place], some [place].    CMP     Component Value Date/Time   NA 140 09/03/2018 1037   K 5.4 (H) 09/03/2018 1037   CL 106 09/03/2018 1037   CO2 21 09/03/2018 1037   GLUCOSE 256 (H) 09/03/2018 1037   GLUCOSE 196 (H) 07/07/2018 1520   BUN 29 (H) 09/03/2018 1037   CREATININE 1.20 (H) 09/03/2018 1037   CALCIUM 8.4 (L) 09/03/2018 1037   PROT 7.1 09/03/2018 1037   ALBUMIN 3.7 09/03/2018 1037   AST 26 09/03/2018 1037   ALT 39 (H) 09/03/2018 1037   ALKPHOS 104 09/03/2018 1037   BILITOT 0.3 09/03/2018 1037   GFRNONAA 50 (L) 09/03/2018 1037   GFRAA 58 (L) 09/03/2018 1037   Diabetic Labs (most recent): Lab Results  Component Value Date   HGBA1C 9.5 (H) 09/03/2018   HGBA1C 14.0 (H) 04/29/2018   HGBA1C 9.1 (H) 07/20/2017     Lipid Panel ( most recent) Lipid Panel     Component Value Date/Time   CHOL 156 04/29/2018  1509   TRIG 123 04/29/2018 1509   HDL 47 04/29/2018 1509   CHOLHDL 3.3 04/29/2018 1509   VLDL 25 04/29/2018 1509   LDLCALC 84 04/29/2018 1509      Lab Results  Component Value Date   TSH 3.880 09/03/2018   TSH 1.984 04/29/2018   FREET4 1.39  09/03/2018      Assessment & Plan:   1. DM type 2 causing vascular disease (HCC)  - Jill [name] has currently uncontrolled symptomatic type 2 DM since 57 years of age.  She returns with tightly controlled fasting glycemic profile, however significantly above target nightly readings.  She did not monitor blood glucose 4 times a day as recommended.    Her most recent labs show A1c of 9.5% improving from 14%.    Recent labs reviewed.  -her diabetes is complicated by CHF, Charcot's deformity on the right lower extremity, obesity/sedentary life, and she remains at extremely high risk for more acute and chronic complications which include CAD, CVA, CKD, retinopathy, and neuropathy. These are all discussed in detail with her.  - I have counseled her on diet management and weight loss, by adopting a carbohydrate restricted/protein rich diet. -  Suggestion is made for her to avoid simple carbohydrates  from her diet including Cakes, Sweet Desserts / Pastries, Ice Cream, Soda (diet and regular), Sweet Tea, Candies, Chips, Cookies, Store Bought Juices, Alcohol in Excess of  1-2 drinks a day, Artificial Sweeteners, and "Sugar-free" Products. This will help patient to have stable blood glucose profile and potentially avoid unintended weight gain.   - I encouraged her to switch to  unprocessed or minimally processed complex starch and increased protein intake (animal or plant source), fruits, and vegetables.  - she is advised to stick to a routine mealtimes to eat 3 meals  a day and avoid unnecessary snacks ( to snack only to correct hypoglycemia).   - she will be scheduled with Norm Salt, RDN, CDE for individualized diabetes education.  - I have  approached her with the following individualized plan to manage diabetes and patient agrees:   -I saw her with an interpreter today, Mardene Celeste, to convey the important message of  continue only on recommended insulin dosage and regimen.   -She is advised to lower her Levemir to 40 units only at bedtime, start monitoring blood glucose 4 times a day- before meals and at bedtime and return in 2 weeks with her meter and logs for reevaluation.    -If she needs more insulin, it would be prandial insulin which she will be taking with breakfast, lunch, and supper.    - she is warned not to take insulin without proper monitoring per orders. - she is encouraged to call clinic for blood glucose levels less than 70 or above 300 mg /dl. - I discussed and increase her Trulicity to 1.5  mg subcutaneously weekly, therapeutically suitable for patient .  - Patient specific target  A1c;  LDL, HDL, Triglycerides, and  Waist Circumference were discussed in detail.  2) BP/HTN:  her blood pressure is controlled to target.   she is advised to continue her current medications including metoprolol 12.5 mg p.o. twice daily. -Given her bilateral lower extremity edema, I discussed and initiated furosemide 20 mg p.o. every morning.  3) Lipids/HPL:   Review of her recent lipid panel showed  controlled  LDL at 84 .  she  is advised to continue    Crestor 20 mg daily at bedtime.  Side effects and precautions discussed with her.  4)  Weight/Diet:  Body mass index is 41.98 kg/m.  - clearly complicating her diabetes care.  I discussed with her the fact that loss of 5 - 10% of her  current body weight will have the most impact on her diabetes management.  CDE Consult will be  initiated . Exercise, and detailed carbohydrates information provided  -  detailed on discharge instructions.  5) Chronic Care/Health Maintenance:  -she  Is on Statin medications and  is encouraged to initiate and continue to follow up with Ophthalmology,  Dentist,  Podiatrist at least yearly or according to recommendations, and advised to  stay away from smoking. I have recommended yearly flu vaccine and pneumonia vaccine at least every 5 years; moderate intensity exercise for up to 150 minutes weekly; and  sleep for at least 7 hours a day.  - I advised patient to maintain close follow up with [facility] for primary care needs.  -She has bilateral Charcot its feet, will benefit from diabetic shoes.  I gave her a prescription for diabetic shoes.  - Time spent with the patient: 25 min, of which >50% was spent in reviewing her blood glucose logs , discussing her hypo- and hyper-glycemic episodes, reviewing her current and  previous labs and insulin doses and developing a plan to avoid hypo- and hyper-glycemia. Please refer to Patient Instructions for Blood Glucose Monitoring and Insulin/Medications Dosing Guide"  in media tab for additional information. Drue Dun participated in the discussions, expressed understanding, and voiced agreement with the above plans.  All questions were answered to her satisfaction. she is encouraged to contact clinic should she have any questions or concerns prior to her return visit.  Follow up plan: - Return in about 2 weeks (around 09/20/2018) for Follow up with Meter and Logs Only - no Labs.  Marquis Lunch, MD [facility] [address] [place], [place] [address] Phone: [phone]  Fax: [phone]    09/06/2018, 6:33 PM  This note was partially dictated with voice recognition software. Similar sounding words can be transcribed inadequately or may not  be corrected upon review.

## 2018-09-06 NOTE — Patient Instructions (Signed)
Goals 1. Don't skip meals 2. Eat breakfast by 8 am, lunch 12-2, Dinner 5-7 pm 3. Increase vegetables with lunch and dinner  Eat 2 pieces of fruit a day.

## 2018-09-07 ENCOUNTER — Encounter: Payer: Self-pay | Admitting: Nutrition

## 2018-09-10 ENCOUNTER — Encounter: Payer: Self-pay | Admitting: Student

## 2018-09-10 ENCOUNTER — Ambulatory Visit (INDEPENDENT_AMBULATORY_CARE_PROVIDER_SITE_OTHER): Payer: Medicaid Other | Admitting: Student

## 2018-09-10 VITALS — BP 138/80 | HR 77 | Ht 62.0 in | Wt 238.8 lb

## 2018-09-10 DIAGNOSIS — E875 Hyperkalemia: Secondary | ICD-10-CM

## 2018-09-10 DIAGNOSIS — E119 Type 2 diabetes mellitus without complications: Secondary | ICD-10-CM

## 2018-09-10 DIAGNOSIS — E785 Hyperlipidemia, unspecified: Secondary | ICD-10-CM | POA: Diagnosis not present

## 2018-09-10 DIAGNOSIS — Z794 Long term (current) use of insulin: Secondary | ICD-10-CM

## 2018-09-10 DIAGNOSIS — I951 Orthostatic hypotension: Secondary | ICD-10-CM | POA: Diagnosis not present

## 2018-09-10 DIAGNOSIS — I340 Nonrheumatic mitral (valve) insufficiency: Secondary | ICD-10-CM | POA: Diagnosis not present

## 2018-09-10 DIAGNOSIS — I2510 Atherosclerotic heart disease of native coronary artery without angina pectoris: Secondary | ICD-10-CM | POA: Diagnosis not present

## 2018-09-10 DIAGNOSIS — IMO0001 Reserved for inherently not codable concepts without codable children: Secondary | ICD-10-CM

## 2018-09-10 MED ORDER — ROSUVASTATIN CALCIUM 20 MG PO TABS: 20.0000 mg | ORAL_TABLET | Freq: Every day | ORAL | 3 refills | Status: AC

## 2018-09-10 MED ORDER — METOPROLOL SUCCINATE ER 25 MG PO TB24: 12.5000 mg | ORAL_TABLET | Freq: Every day | ORAL | 3 refills | Status: AC

## 2018-09-10 MED ORDER — MIDODRINE HCL 5 MG PO TABS: 5.0000 mg | ORAL_TABLET | Freq: Three times a day (TID) | ORAL | 3 refills | Status: AC

## 2018-09-10 NOTE — Patient Instructions (Addendum)
Medication Instructions:  Your physician recommends that you continue on your current medications as directed. Please refer to the Current Medication list given to you today.  If you need a refill on your cardiac medications before your next appointment, please call your pharmacy.   Lab work: NONE  If you have labs (blood work) drawn today and your tests are completely normal, you will receive your results only by: Marland Kitchen MyChart Message (if you have MyChart) OR . A paper copy in the mail If you have any lab test that is abnormal or we need to change your treatment, we will call you to review the results.  Testing/Procedures: NONE   Follow-Up: At [facility], you and your health needs are our priority.  As part of our continuing mission to provide you with exceptional heart care, we have created designated Provider Care Teams.  These Care Teams include your primary Cardiologist (physician) and Advanced Practice Providers (APPs -  Physician Assistants and Nurse Practitioners) who all work together to provide you with the care you need, when you need it. You will need a follow up appointment in 6 months.  Please call our office 2 months in advance to schedule this appointment.  You may see Dina Rich, MD or one of the following Advanced Practice Providers on your designated Care Team:   Randall An, PA-C [facility]) . Jacolyn Reedy, PA-C [facility])  Any Other Special Instructions Will Be Listed Below (If Applicable). Thank you for choosing [facility]!  Can take plain Mucinex (BLUE BOX) for sinus congestion.

## 2018-09-10 NOTE — Progress Notes (Signed)
Cardiology Office Note    Date:  09/10/2018   ID:  Jill [name], DOB 12/03/1960, MRN [id number]  PCP:  [facility]  Cardiologist: Dina Rich, MD    Chief Complaint  Patient presents with  . Follow-up    2 month visit    History of Present Illness:    Jill [name] is a 57 y.o. female with past medical history of CAD (s/p CABG in 07/2017 with LIMA-LAD, SVG-D1, SVG-OM2, and SVG-PDA), mitral regurgitation, pulmonary hypertension, HLD, Type II DM, and orthostatic hypotension who presents to the office today for 76-month follow-up.  She was last examined by Dr. Wyline Mood in 06/2018 and denied any recent chest pain or dyspnea on exertion at that time. Labs had recently been obtained and showed that she was significantly hyperkalemic with K+ at 6.5, therefore she was referred to the ED for further treatment. She received IV fluids, dextrose, insulin, and bicarbonate with repeat labs showing her K+ had normalized to 4.3.  Most recent labs obtained by Endocrinology on 09/03/2018 showed creatinine was overall stable at 1.20 with K+ at 5.4. Hemoglobin A1c was elevated at 9.5 but this had significantly improved from 14.0 in 04/2018. She was started on Lasix 20 mg daily at the time of her office visit on 09/06/2018.  In talking with the patient today via the assistance of an interpreter, she reports overall doing well from a cardiac perspective since her last office visit. Denies any recent chest pain or dyspnea on exertion. Does have fatigue which has been present for quite some time and she reports having recently broke a bone in her right foot which has limited her activity.  Reports having sinus congestion over the past several days and has only been taking Tylenol. She reported having a subjective fever and temperature was checked during today's visit and at 98.8. Did receive her flu shot approximately two months ago. She denies any recent orthopnea, PND, palpitations,  dizziness, or presyncope.  Was recently started on Lasix and reports improvement in her lower extremity edema.  She is having frequent urination with this.  Past Medical History:  Diagnosis Date  . CAD (coronary artery disease)    a. s/p CABG in 07/2017 with LIMA-LAD, SVG-D1, SVG-OM2, and SVG-PDA  . Diabetic neuropathy (HCC)   . Dyspnea   . Dysrhythmia   . Headache   . Hyperkalemia   . Hyperlipidemia   . Hypertension   . Type 2 diabetes mellitus (HCC)     Past Surgical History:  Procedure Laterality Date  . CORONARY ARTERY BYPASS GRAFT N/A 07/22/2017   Procedure: CORONARY ARTERY BYPASS GRAFTING (CABG) x four, using left internal mammary artery and right leg greater saphenous vein harvested endoscopically;  Surgeon: Loreli Slot, MD;  Location: [facility] OR;  Service: Open Heart Surgery;  Laterality: N/A;  . RIGHT/LEFT HEART CATH AND CORONARY ANGIOGRAPHY N/A 06/26/2017   Procedure: RIGHT/LEFT HEART CATH AND CORONARY ANGIOGRAPHY;  Surgeon: Marykay Lex, MD;  Location: [facility] INVASIVE CV LAB;  Service: Cardiovascular;  Laterality: N/A;  . TEE WITHOUT CARDIOVERSION N/A 06/25/2017   Procedure: TRANSESOPHAGEAL ECHOCARDIOGRAM (TEE);  Surgeon: Thurmon Fair, MD;  Location: [facility] ENDOSCOPY;  Service: Cardiovascular;  Laterality: N/A;  . TEE WITHOUT CARDIOVERSION N/A 07/22/2017   Procedure: TRANSESOPHAGEAL ECHOCARDIOGRAM (TEE);  Surgeon: Loreli Slot, MD;  Location: [facility] OR;  Service: Open Heart Surgery;  Laterality: N/A;  . TUBAL LIGATION      Current Medications: Outpatient Medications Prior to Visit  Medication Sig Dispense  Refill  . acetaminophen (TYLENOL) 500 MG tablet Take 2 tablets (1,000 mg total) by mouth every 6 (six) hours as needed. 30 tablet 0  . aspirin EC 81 MG tablet Take 81 mg by mouth daily.    . Cholecalciferol (VITAMIN D3) 125 MCG (5000 UT) CAPS Take 1 capsule (5,000 Units total) by mouth daily. 90 capsule 0  . Dulaglutide (TRULICITY) 1.5 MG/0.5ML SOPN Inject 1.5 mg  into the skin once a week. 4 pen 2  . furosemide (LASIX) 20 MG tablet Take 1 tablet (20 mg total) by mouth daily with breakfast. 30 tablet 3  . gabapentin (NEURONTIN) 400 MG capsule Take 400 mg by mouth 3 (three) times daily as needed (foot pain).     Marland Kitchen ibuprofen (ADVIL,MOTRIN) 600 MG tablet Take 1 tablet (600 mg total) by mouth every 6 (six) hours as needed for moderate pain. 20 tablet 0  . insulin detemir (LEVEMIR) 100 UNIT/ML injection Inject 45 Units into the skin at bedtime.     Marland Kitchen loratadine (CLARITIN) 10 MG tablet Take 10 mg by mouth daily.    . silver sulfADIAZINE (SILVADENE) 1 % cream Apply 1 application topically daily.    Marland Kitchen terbinafine (LAMISIL) 250 MG tablet Take 1 tablet (250 mg total) by mouth daily. 90 tablet 0  . traMADol (ULTRAM) 50 MG tablet Take 1 tablet (50 mg total) by mouth every 6 (six) hours as needed. 20 tablet 0  . vitamin C (ASCORBIC ACID) 500 MG tablet Take 500 mg by mouth daily.    . metoprolol succinate (TOPROL-XL) 25 MG 24 hr tablet Take 0.5 tablets (12.5 mg total) by mouth daily. 30 tablet 1  . midodrine (PROAMATINE) 5 MG tablet Take 1 tablet (5 mg total) by mouth 3 (three) times daily with meals. 270 tablet 1  . rosuvastatin (CRESTOR) 20 MG tablet Take 1 tablet (20 mg total) by mouth at bedtime. 90 tablet 3   No facility-administered medications prior to visit.      Allergies:   Patient has no known allergies.   Social History   Socioeconomic History  . Marital status: Married    Spouse name: Not on file  . Number of children: Not on file  . Years of education: Not on file  . Highest education level: Not on file  Occupational History  . Not on file  Social Needs  . Financial resource strain: Not on file  . Food insecurity:    Worry: Not on file    Inability: Not on file  . Transportation needs:    Medical: Not on file    Non-medical: Not on file  Tobacco Use  . Smoking status: Never Smoker  . Smokeless tobacco: Never Used  Substance and Sexual  Activity  . Alcohol use: No  . Drug use: No  . Sexual activity: Yes    Birth control/protection: Surgical  Lifestyle  . Physical activity:    Days per week: Not on file    Minutes per session: Not on file  . Stress: Not on file  Relationships  . Social connections:    Talks on phone: Not on file    Gets together: Not on file    Attends religious service: Not on file    Active member of club or organization: Not on file    Attends meetings of clubs or organizations: Not on file    Relationship status: Not on file  Other Topics Concern  . Not on file  Social History Narrative  .  Not on file     Family History:  The patient's family history includes Diabetes type II in her other; Hypertension in her other.   Review of Systems:   Please see the history of present illness.     General:  No chills, fever, night sweats or weight changes. Positive for fatigue.  Cardiovascular:  No chest pain, dyspnea on exertion, edema, orthopnea, palpitations, paroxysmal nocturnal dyspnea. Dermatological: No rash, lesions/masses Respiratory: No cough or dyspnea. Positive for sinus congestion.  Urologic: No hematuria, dysuria Abdominal:   No nausea, vomiting, diarrhea, bright red blood per rectum, melena, or hematemesis Neurologic:  No visual changes, wkns, changes in mental status. All other systems reviewed and are otherwise negative except as noted above.   Physical Exam:    VS:  BP 138/80   Pulse 77   Ht 5\' 2"  (1.575 m)   Wt 238 lb 12.8 oz (108.3 kg)   SpO2 90%   BMI 43.68 kg/m    General: Well developed, obese Hispanic female appearing in no acute distress. Head: Normocephalic, atraumatic, sclera non-icteric, no xanthomas, nares are without discharge.  Neck: No carotid bruits. JVD not elevated.  Lungs: Respirations regular and unlabored, without rales. Occasional expiratory wheeze.  Heart: Regular rate and rhythm. No S3 or S4.  No murmur, no rubs, or gallops appreciated. Sternal scar  noted.  Abdomen: Soft, non-tender, non-distended with normoactive bowel sounds. No hepatomegaly. No rebound/guarding. No obvious abdominal masses. Msk:  Strength and tone appear normal for age. No joint deformities or effusions. Extremities: No clubbing or cyanosis. Trace lower extremity edema.  Wrap around right ankle. Distal pedal pulses are 2+ bilaterally. Neuro: Alert and oriented X 3. Moves all extremities spontaneously. No focal deficits noted. Psych:  Responds to questions appropriately with a normal affect. Skin: No rashes or lesions noted  Wt Readings from Last 3 Encounters:  09/10/18 238 lb 12.8 oz (108.3 kg)  09/06/18 237 lb (107.5 kg)  09/06/18 237 lb (107.5 kg)    Studies/Labs Reviewed:   EKG:  EKG is not ordered today.   Recent Labs: 07/06/2018: Magnesium 2.3 07/07/2018: Hemoglobin 10.9; Platelets 278 09/03/2018: ALT 39; BUN 29; Creatinine, Ser 1.20; Potassium 5.4; Sodium 140; TSH 3.880   Lipid Panel    Component Value Date/Time   CHOL 156 04/29/2018 1509   TRIG 123 04/29/2018 1509   HDL 47 04/29/2018 1509   CHOLHDL 3.3 04/29/2018 1509   VLDL 25 04/29/2018 1509   LDLCALC 84 04/29/2018 1509    Additional studies/ records that were reviewed today include:   Cardiac Catheterization: 06/2017  Prox RCA lesion, 70 %stenosed. Mid RCA lesion, 40 %stenosed. Dist RCA lesion, 40 %stenosed.  Ost 2nd Diag to 2nd Diag lesion, 90 %stenosed.  Cx-OM1 bifurcation: Prox Cx lesion, 80 %stenosed. Mid Cx lesion, 100 %stenosed. Ost 1st Mrg lesion, 70 %stenosed.  There is moderate left ventricular systolic dysfunction. The left ventricular ejection fraction is 35-45% by visual estimate.  LV end diastolic pressure is severely elevated.  There is moderate (3+) mitral regurgitation by LV Gram, but Lage V wave on PCWP would suggest Severe MR.  There is no aortic valve stenosis.  Hemodynamic findings consistent with severe pulmonary hypertension. Mostly Secondary to MR & elevated  LVEDP.   Severe 2 Vessel disease (moderate to severe RCA and one or percent mid circumflex-OM occlusion) along with severe diffusely diseased very small caliber Diag branches and mild diffuse disease throughout the LAD.  Most likely at least moderate-severe mitral regurgitation based on  large V wave.  Recommend CV surgery consultation to determine the best course of action would treating the patient's mitral valve and CAD.  She will return to nursing unit for ongoing care.  TEE: 07/2017  Left ventricle: Concentric hypertrophy of mild severity. LV systolic function is low normal with an EF of 50-55%. There are no obvious wall motion abnormalities.  Aortic valve: The valve is trileaflet. No stenosis. No regurgitation.  Mitral valve: Non-specific thickening. Mild regurgitation.  Right ventricle: Normal cavity size, wall thickness and ejection fraction.  Tricuspid valve: Trace regurgitation. The tricuspid valve regurgitation jet is central.  Assessment:    1. Coronary artery disease involving native coronary artery of native heart without angina pectoris   2. Mitral valve insufficiency, unspecified etiology   3. Orthostatic hypotension   4. Hyperlipidemia LDL goal <70   5. Insulin dependent diabetes mellitus (HCC)   6. Hyperkalemia      Plan:   In order of problems listed above:  1. CAD - s/p CABG in 07/2017 with LIMA-LAD, SVG-D1, SVG-OM2, and SVG-PDA. EF 50-55% by TEE at time of CABG. She does report worsening fatigue over the past several months in the setting of less activity due to a foot injury but denies any associated chest discomfort or dyspnea on exertion. - continue ASA, low-dose BB, and statin therapy. Not on ACE-I or ARB given hyperkalemia.   2. Mitral Regurgitation - mild by TEE in 07/2017. Continue to follow.  3. Orthostatic Hypotension - By review of notes, this started to occur following CABG and she has experienced symptoms since.  Midodrine was  previously reduced to 5 mg twice daily but her symptoms increased and this was titrated back to TID dosing. She denies any recent lightheadedness or dizziness and BP is well controlled at 138/80 during today's visit.  - continue Midodrine 5mg  TID.   4. HLD - Followed by PCP. Goal LDL is less than 70 in the setting of known CAD. She remains on Crestor 20 mg daily.  5. Type 2 DM - Hgb A1c elevated to 14.0 in 04/2018, improved to 9.5 in 08/2018. Followed by Endocrinology.   6. Hyperkalemia -  K+ elevated to 6.5 in 06/2018 which led to ED evaluation. Stable at 5.4 on most recent labs on 09/03/2018 and started on Lasix 20mg  daily on 09/06/2018 which should help with her K+ level. Due for repeat labs next week. Continue to avoid ACE-I and ARB.     Medication Adjustments/Labs and Tests Ordered: Current medicines are reviewed at length with the patient today.  Concerns regarding medicines are outlined above.  Medication changes, Labs and Tests ordered today are listed in the Patient Instructions below. Patient Instructions  Medication Instructions:  Your physician recommends that you continue on your current medications as directed. Please refer to the Current Medication list given to you today.  If you need a refill on your cardiac medications before your next appointment, please call your pharmacy.   Lab work: NONE  If you have labs (blood work) drawn today and your tests are completely normal, you will receive your results only by: Marland Kitchen MyChart Message (if you have MyChart) OR . A paper copy in the mail If you have any lab test that is abnormal or we need to change your treatment, we will call you to review the results.  Testing/Procedures: NONE   Follow-Up: At [facility], you and your health needs are our priority.  As part of our continuing mission to provide you with  exceptional heart care, we have created designated Provider Care Teams.  These Care Teams include your primary  Cardiologist (physician) and Advanced Practice Providers (APPs -  Physician Assistants and Nurse Practitioners) who all work together to provide you with the care you need, when you need it. You will need a follow up appointment in 6 months.  Please call our office 2 months in advance to schedule this appointment.  You may see Dina Rich, MD or one of the following Advanced Practice Providers on your designated Care Team:   Randall An, PA-C [facility]) . Jacolyn Reedy, PA-C [facility] Office)  Any Other Special Instructions Will Be Listed Below (If Applicable). Thank you for choosing [facility]!  Can take plain Mucinex (BLUE BOX) for sinus congestion.   Signed, Ellsworth Lennox, PA-C  09/10/2018 1:42 PM    [facility] [address] [place], [place] [address] Phone: [phone]

## 2018-09-21 ENCOUNTER — Encounter: Payer: Self-pay | Admitting: "Endocrinology

## 2018-09-21 ENCOUNTER — Ambulatory Visit: Payer: Medicaid Other | Admitting: "Endocrinology

## 2018-09-21 ENCOUNTER — Ambulatory Visit: Payer: Self-pay | Admitting: Nutrition

## 2019-01-10 ENCOUNTER — Telehealth: Payer: Self-pay

## 2019-01-10 ENCOUNTER — Ambulatory Visit: Payer: Medicaid Other | Admitting: Podiatry

## 2019-01-10 DIAGNOSIS — E559 Vitamin D deficiency, unspecified: Secondary | ICD-10-CM

## 2019-01-10 DIAGNOSIS — E782 Mixed hyperlipidemia: Secondary | ICD-10-CM

## 2019-01-10 DIAGNOSIS — R5382 Chronic fatigue, unspecified: Secondary | ICD-10-CM

## 2019-01-10 DIAGNOSIS — I10 Essential (primary) hypertension: Secondary | ICD-10-CM

## 2019-01-10 DIAGNOSIS — E1159 Type 2 diabetes mellitus with other circulatory complications: Secondary | ICD-10-CM

## 2019-01-10 NOTE — Telephone Encounter (Signed)
Jill [name], CMA  

## 2019-01-11 ENCOUNTER — Encounter: Payer: Self-pay | Admitting: Nutrition

## 2019-01-11 ENCOUNTER — Other Ambulatory Visit: Payer: Self-pay

## 2019-01-11 ENCOUNTER — Encounter: Payer: Medicaid Other | Attending: "Endocrinology | Admitting: Nutrition

## 2019-01-11 DIAGNOSIS — E669 Obesity, unspecified: Secondary | ICD-10-CM | POA: Insufficient documentation

## 2019-01-11 DIAGNOSIS — E1165 Type 2 diabetes mellitus with hyperglycemia: Secondary | ICD-10-CM | POA: Insufficient documentation

## 2019-01-11 DIAGNOSIS — IMO0002 Reserved for concepts with insufficient information to code with codable children: Secondary | ICD-10-CM

## 2019-01-11 DIAGNOSIS — E118 Type 2 diabetes mellitus with unspecified complications: Secondary | ICD-10-CM | POA: Insufficient documentation

## 2019-01-11 NOTE — Progress Notes (Signed)
Telephone visit follow up for Dm due to Covid !9.  Just got back from [place] for death infamily. She notes her foot is better.taking the gabapenin is heping her foot.    Didn't test blood sugars in [place]. Gong to get blood work today. Sees Dr. Fransico Him, Endocrinology. Not testing blood sugars. Willing to start. Appetite is good.  Levermir 45 units daily and Truclicity weekly  B) Tortilla ad meat,coffee, L) Homiy soup and tostatoes 2 and water D) didn't eat much  Goals  Dont skip meals Testing blood sugar twice a day Try to do chairs exercises when able daily  Get A1C down to 7% Get lab work done today Make appt with Dr. Fransico Him   Follow up in 2 months

## 2019-01-11 NOTE — Patient Instructions (Signed)
Goals  Dont skip meals Testing blood sugar twice a day Try to do chairs exercises when able daily  Get A1C down to 7% Get lab work done today Make appt with Dr. Fransico Him

## 2019-01-13 ENCOUNTER — Other Ambulatory Visit: Payer: Self-pay | Admitting: "Endocrinology

## 2019-01-13 LAB — COMPREHENSIVE METABOLIC PANEL
AG RATIO: 0.9 (calc) — AB (ref 1.0–2.5)
ALT: 20 U/L (ref 6–29)
AST: 22 U/L (ref 10–35)
Albumin: 3.8 g/dL (ref 3.6–5.1)
Alkaline phosphatase (APISO): 89 U/L (ref 37–153)
BILIRUBIN TOTAL: 0.3 mg/dL (ref 0.2–1.2)
BUN / CREAT RATIO: 22 (calc) (ref 6–22)
BUN: 31 mg/dL — ABNORMAL HIGH (ref 7–25)
CALCIUM: 9 mg/dL (ref 8.6–10.4)
CHLORIDE: 105 mmol/L (ref 98–110)
CO2: 27 mmol/L (ref 20–32)
Creat: 1.44 mg/dL — ABNORMAL HIGH (ref 0.50–1.05)
GLOBULIN: 4.1 g/dL — AB (ref 1.9–3.7)
GLUCOSE: 272 mg/dL — AB (ref 65–139)
Potassium: 5.3 mmol/L (ref 3.5–5.3)
SODIUM: 139 mmol/L (ref 135–146)
Total Protein: 7.9 g/dL (ref 6.1–8.1)

## 2019-01-13 LAB — VITAMIN D 25 HYDROXY (VIT D DEFICIENCY, FRACTURES): VIT D 25 HYDROXY: 28 ng/mL — AB (ref 30–100)

## 2019-01-13 LAB — MICROALBUMIN / CREATININE URINE RATIO
Creatinine, Urine: 116 mg/dL (ref 20–275)
Microalb Creat Ratio: 811 mcg/mg creat — ABNORMAL HIGH (ref <30)
Microalb, Ur: 94.1 mg/dL

## 2019-01-13 LAB — HEMOGLOBIN A1C
HEMOGLOBIN A1C: 12.4 %{Hb} — AB (ref <5.7)
Mean Plasma Glucose: 309 (calc)
eAG (mmol/L): 17.1 (calc)

## 2019-01-13 LAB — TSH: TSH: 0.17 mIU/L — ABNORMAL LOW (ref 0.40–4.50)

## 2019-01-13 LAB — T4, FREE: Free T4: 1.2 ng/dL (ref 0.8–1.8)

## 2019-01-27 ENCOUNTER — Observation Stay (HOSPITAL_COMMUNITY)
Admission: EM | Admit: 2019-01-27 | Discharge: 2019-01-30 | Disposition: A | Discharge status: Home / Self Care | Payer: Medicaid Other | Attending: Internal Medicine | Admitting: Internal Medicine

## 2019-01-27 ENCOUNTER — Other Ambulatory Visit: Payer: Self-pay

## 2019-01-27 DIAGNOSIS — Z79899 Other long term (current) drug therapy: Secondary | ICD-10-CM | POA: Insufficient documentation

## 2019-01-27 DIAGNOSIS — E875 Hyperkalemia: Principal | ICD-10-CM | POA: Diagnosis present

## 2019-01-27 DIAGNOSIS — N183 Chronic kidney disease, stage 3 unspecified: Secondary | ICD-10-CM | POA: Diagnosis present

## 2019-01-27 DIAGNOSIS — E1140 Type 2 diabetes mellitus with diabetic neuropathy, unspecified: Secondary | ICD-10-CM | POA: Insufficient documentation

## 2019-01-27 DIAGNOSIS — Z7982 Long term (current) use of aspirin: Secondary | ICD-10-CM | POA: Diagnosis not present

## 2019-01-27 DIAGNOSIS — I129 Hypertensive chronic kidney disease with stage 1 through stage 4 chronic kidney disease, or unspecified chronic kidney disease: Secondary | ICD-10-CM | POA: Insufficient documentation

## 2019-01-27 DIAGNOSIS — E1122 Type 2 diabetes mellitus with diabetic chronic kidney disease: Secondary | ICD-10-CM | POA: Insufficient documentation

## 2019-01-27 DIAGNOSIS — E1169 Type 2 diabetes mellitus with other specified complication: Secondary | ICD-10-CM | POA: Diagnosis not present

## 2019-01-27 DIAGNOSIS — Z794 Long term (current) use of insulin: Secondary | ICD-10-CM | POA: Diagnosis not present

## 2019-01-27 DIAGNOSIS — Z951 Presence of aortocoronary bypass graft: Secondary | ICD-10-CM | POA: Diagnosis not present

## 2019-01-27 DIAGNOSIS — R739 Hyperglycemia, unspecified: Secondary | ICD-10-CM | POA: Diagnosis present

## 2019-01-27 DIAGNOSIS — E1100 Type 2 diabetes mellitus with hyperosmolarity without nonketotic hyperglycemic-hyperosmolar coma (NKHHC): Secondary | ICD-10-CM

## 2019-01-27 DIAGNOSIS — I2510 Atherosclerotic heart disease of native coronary artery without angina pectoris: Secondary | ICD-10-CM | POA: Diagnosis not present

## 2019-01-27 DIAGNOSIS — N390 Urinary tract infection, site not specified: Secondary | ICD-10-CM | POA: Diagnosis not present

## 2019-01-27 DIAGNOSIS — R51 Headache: Secondary | ICD-10-CM | POA: Diagnosis present

## 2019-01-27 DIAGNOSIS — N179 Acute kidney failure, unspecified: Secondary | ICD-10-CM

## 2019-01-27 DIAGNOSIS — R112 Nausea with vomiting, unspecified: Secondary | ICD-10-CM

## 2019-01-27 LAB — URINALYSIS, ROUTINE W REFLEX MICROSCOPIC
Bilirubin Urine: NEGATIVE
Glucose, UA: >=500 mg/dL — AB
Ketones, ur: NEGATIVE mg/dL
Nitrite: POSITIVE — AB
Protein, ur: 30 mg/dL — AB
Specific Gravity, Urine: 1.018 (ref 1.005–1.030)
WBC, UA: >50 WBC/hpf — ABNORMAL HIGH (ref 0–5)
pH: 6 (ref 5.0–8.0)

## 2019-01-27 LAB — COMPREHENSIVE METABOLIC PANEL
ALT: 31 U/L (ref 0–44)
AST: 20 U/L (ref 15–41)
Albumin: 4.1 g/dL (ref 3.5–5.0)
Alkaline Phosphatase: 98 U/L (ref 38–126)
Anion gap: 10 (ref 5–15)
BUN: 48 mg/dL — ABNORMAL HIGH (ref 6–20)
CO2: 26 mmol/L (ref 22–32)
Calcium: 9.2 mg/dL (ref 8.9–10.3)
Chloride: 91 mmol/L — ABNORMAL LOW (ref 98–111)
Creatinine, Ser: 1.74 mg/dL — ABNORMAL HIGH (ref 0.44–1.00)
GFR calc Af Amer: 37 mL/min — ABNORMAL LOW (ref >60)
GFR calc non Af Amer: 32 mL/min — ABNORMAL LOW (ref >60)
Glucose, Bld: 571 mg/dL (ref 70–99)
Potassium: 6.5 mmol/L (ref 3.5–5.1)
Sodium: 127 mmol/L — ABNORMAL LOW (ref 135–145)
Total Bilirubin: 0.6 mg/dL (ref 0.3–1.2)
Total Protein: 9.1 g/dL — ABNORMAL HIGH (ref 6.5–8.1)

## 2019-01-27 LAB — TROPONIN I: Troponin I: <0.03 ng/mL (ref <0.03)

## 2019-01-27 LAB — CBC WITH DIFFERENTIAL/PLATELET
Abs Immature Granulocytes: 0.02 10*3/uL (ref 0.00–0.07)
Basophils Absolute: 0.1 10*3/uL (ref 0.0–0.1)
Basophils Relative: 1 %
Eosinophils Absolute: 0.1 10*3/uL (ref 0.0–0.5)
Eosinophils Relative: 1 %
HCT: 40 % (ref 36.0–46.0)
Hemoglobin: 12.2 g/dL (ref 12.0–15.0)
Immature Granulocytes: 0 %
Lymphocytes Relative: 17 %
Lymphs Abs: 1.5 10*3/uL (ref 0.7–4.0)
MCH: 24 pg — ABNORMAL LOW (ref 26.0–34.0)
MCHC: 30.5 g/dL (ref 30.0–36.0)
MCV: 78.7 fL — ABNORMAL LOW (ref 80.0–100.0)
Monocytes Absolute: 0.6 10*3/uL (ref 0.1–1.0)
Monocytes Relative: 6 %
Neutro Abs: 6.5 10*3/uL (ref 1.7–7.7)
Neutrophils Relative %: 75 %
Platelets: 282 10*3/uL (ref 150–400)
RBC: 5.08 MIL/uL (ref 3.87–5.11)
RDW: 15.3 % (ref 11.5–15.5)
WBC: 8.8 10*3/uL (ref 4.0–10.5)
nRBC: 0 % (ref 0.0–0.2)

## 2019-01-27 LAB — CBG MONITORING, ED
Glucose-Capillary: 561 mg/dL (ref 70–99)
Glucose-Capillary: 450 mg/dL — ABNORMAL HIGH (ref 70–99)

## 2019-01-27 MED ORDER — DIPHENHYDRAMINE HCL 50 MG/ML IJ SOLN
25.0000 mg | Freq: Once | INTRAMUSCULAR | Status: AC
  Administered 2019-01-27: 25 mg via INTRAVENOUS
  Filled 2019-01-27: qty 1

## 2019-01-27 MED ORDER — SODIUM CHLORIDE 0.9 % IV SOLN
1.0000 g | Freq: Once | INTRAVENOUS | Status: AC
  Administered 2019-01-27: 1 g via INTRAVENOUS
  Filled 2019-01-27: qty 10

## 2019-01-27 MED ORDER — METOCLOPRAMIDE HCL 5 MG/ML IJ SOLN
10.0000 mg | Freq: Once | INTRAMUSCULAR | Status: AC
  Administered 2019-01-27: 10 mg via INTRAVENOUS
  Filled 2019-01-27: qty 2

## 2019-01-27 MED ORDER — INSULIN ASPART 100 UNIT/ML ~~LOC~~ SOLN
10.0000 [IU] | Freq: Once | SUBCUTANEOUS | Status: AC
  Administered 2019-01-27: 10 [IU] via INTRAVENOUS
  Filled 2019-01-27: qty 1

## 2019-01-27 MED ORDER — SODIUM CHLORIDE 0.9 % IV BOLUS
1000.0000 mL | Freq: Once | INTRAVENOUS | Status: AC
  Administered 2019-01-27: 21:00:00 1000 mL via INTRAVENOUS

## 2019-01-27 NOTE — ED Notes (Signed)
Date and time results received: 01/27/19 2155 (use smartphrase ".now" to insert current time)  Test: glucose Critical Value: 571  Name of Provider Notified: Henderson Baltimore PA  Orders Received? Or Actions Taken?: none at this time

## 2019-01-27 NOTE — ED Provider Notes (Signed)
[facility] EMERGENCY DEPARTMENT Provider Note   CSN: [id number] Arrival date & time: 01/27/19  2007    History   Chief Complaint No chief complaint on file.   HPI Jill [name] is a 58 y.o. female medical history significant for with history of CAD, cardiomyopathy, type 2 diabetes, hypertension presented to emergency department today with multiple medical complaint. Pt has had a headache x3 days. It has been constant. Patient has a history of headaches and states this feels similar.  The pain is located throughout her head and she describes as a pounding.  The headache has progressively worsened, she denies sudden onset.  Patient took Motrin with minimal symptom relief. Denies visual changes, neck pain, rash.  Pt is also reporting generalized weakness and states she has felt "off" x3 days. She has generalized weakness and increased urinary frequency. Also reports associated nausea and vomiting. Pt estimates 4 episodes of non bloody and non bilious emesis in the last 24 hours. She is unable to tolerate PO intake. Pt also states she had a near syncopal episode tonight when using the bathroom because she felt so weak. Admits to subjective fevers at home but did not measure temperature.  Patient has not taken her insulin for the last 3 days.  She states when she checked her sugar prior to arrival the meter read high. She recently got a new insulin pen and is unsure how to use it.   Pt traveled to [place], stayed for 1 month and returned on 01/05/19. She denies any sick contacts or known contact with anyone positive for covid-19. Also denies chest pain, shortness of breath, cough ,visual changes, abdominal pain, diarrhea, rash, neck pain, flank pain.  History obtained with video translator.  Past Medical History:  Diagnosis Date   CAD (coronary artery disease)    a. 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   Dyspnea    Coronary artery disease    Severe mitral insufficiency    Atrial flutter (HCC)    Mitral regurgitation 06/24/2017   Cardiomyopathy (HCC) 06/20/2017   Elevated troponin 06/19/2017   CKD (chronic kidney disease) stage 3, GFR 30-59 ml/min (HCC) 06/19/2017   Pressure injury of skin 06/19/2017   Hyperkalemia 06/18/2017   DM type 2 causing vascular disease (HCC) 06/18/2017   Mixed hyperlipidemia 06/18/2017   Chest pain 06/18/2017   Bronchopneumonia 03/06/2016   CAP (community acquired pneumonia) 03/05/2016   Insulin dependent diabetes mellitus (HCC) 03/05/2016   Essential hypertension, benign 03/05/2016   Hyponatremia 03/05/2016   Normocytic anemia 03/05/2016   Hyperglycemia 03/05/2016   Dehydration 03/05/2016   Hypoxia 03/05/2016   Leukocytosis 03/05/2016   Acute respiratory failure with hypoxia (HCC) 03/05/2016    Past Surgical History:  Procedure Laterality Date   CORONARY ARTERY BYPASS GRAFT N/A 07/22/2017   Procedure: CORONARY ARTERY BYPASS GRAFTING (CABG) x four, using left internal mammary artery and right leg greater saphenous vein harvested endoscopically;  Surgeon: Loreli Slot, MD;  Location: [facility] OR;  Service: Open Heart Surgery;  Laterality: N/A;   RIGHT/LEFT HEART CATH AND CORONARY ANGIOGRAPHY  N/A 06/26/2017   Procedure: RIGHT/LEFT HEART CATH AND CORONARY ANGIOGRAPHY;  Surgeon: Marykay Lex, MD;  Location: [facility] INVASIVE CV LAB;  Service: Cardiovascular;  Laterality: N/A;   TEE WITHOUT CARDIOVERSION N/A 06/25/2017   Procedure: TRANSESOPHAGEAL ECHOCARDIOGRAM (TEE);  Surgeon: Thurmon Fair, MD;  Location: [facility] ENDOSCOPY;  Service: Cardiovascular;  Laterality: N/A;   TEE WITHOUT  CARDIOVERSION N/A 07/22/2017   Procedure: TRANSESOPHAGEAL ECHOCARDIOGRAM (TEE);  Surgeon: Loreli Slot, MD;  Location: [facility] OR;  Service: Open Heart Surgery;  Laterality: N/A;   TUBAL LIGATION       OB History    Gravida  4   Para  4   Term  4   Preterm      AB      Living        SAB      TAB      Ectopic      Multiple      Live Births               Home Medications    Prior to Admission medications   Medication Sig Start Date End Date Taking? Authorizing Provider  acetaminophen (TYLENOL) 500 MG tablet Take 2 tablets (1,000 mg total) by mouth every 6 (six) hours as needed. 07/27/17   Barrett, Rae Roam, PA-C  aspirin EC 81 MG tablet Take 81 mg by mouth daily.    [provider]  Cholecalciferol (VITAMIN D3) 125 MCG (5000 UT) CAPS Take 1 capsule (5,000 Units total) by mouth daily. 09/06/18   Roma Kayser, MD  Dulaglutide (TRULICITY) 1.5 MG/0.5ML SOPN Inject 1.5 mg into the skin once a week. 09/06/18   Roma Kayser, MD  furosemide (LASIX) 20 MG tablet Take 1 tablet (20 mg total) by mouth daily with breakfast. 08/24/18   Nida, Denman George, MD  gabapentin (NEURONTIN) 400 MG capsule Take 400 mg by mouth 3 (three) times daily as needed (foot pain).     [provider]  ibuprofen (ADVIL,MOTRIN) 600 MG tablet Take 1 tablet (600 mg total) by mouth every 6 (six) hours as needed for moderate pain. 06/01/18   Burgess Amor, PA-C  insulin detemir (LEVEMIR) 100 UNIT/ML injection Inject 45 Units into the skin at bedtime.     [provider]  loratadine (CLARITIN) 10 MG tablet Take 10 mg by mouth daily.    [provider]  metoprolol succinate (TOPROL-XL) 25 MG 24 hr tablet Take 0.5 tablets (12.5 mg total) by mouth daily. 09/10/18 09/05/19  Strader, Lennart Pall, PA-C  midodrine (PROAMATINE) 5 MG tablet Take 1 tablet (5 mg total) by mouth 3 (three) times daily with meals. 09/10/18   Strader, Lennart Pall, PA-C  rosuvastatin  (CRESTOR) 20 MG tablet Take 1 tablet (20 mg total) by mouth at bedtime. 09/10/18   Strader, Lennart Pall, PA-C  silver sulfADIAZINE (SILVADENE) 1 % cream Apply 1 application topically daily.    [provider]  terbinafine (LAMISIL) 250 MG tablet Take 1 tablet (250 mg total) by mouth daily. 07/12/18   Felecia Shelling, DPM  traMADol (ULTRAM) 50 MG tablet Take 1 tablet (50 mg total) by mouth every 6 (six) hours as needed. 06/01/18   Burgess Amor, PA-C  vitamin C (ASCORBIC ACID) 500 MG tablet Take 500 mg by mouth daily.    [provider]    Family History Family History  Problem Relation Age of Onset   Diabetes type II Other    Hypertension Other  Social History Social History   Tobacco Use   Smoking status: Never Smoker   Smokeless tobacco: Never Used  Substance Use Topics   Alcohol use: No   Drug use: No     Allergies   Patient has no known allergies.   Review of Systems Review of Systems  Constitutional: Positive for fever. Negative for chills.  HENT: Negative for congestion, ear discharge, ear pain, sinus pressure, sinus pain and sore throat.   Eyes: Negative for pain and redness.  Respiratory: Negative for cough and shortness of breath.   Cardiovascular: Negative for chest pain.  Gastrointestinal: Positive for nausea and vomiting. Negative for abdominal pain, constipation and diarrhea.  Genitourinary: Positive for frequency. Negative for dysuria and hematuria.  Musculoskeletal: Negative for back pain and neck pain.  Skin: Negative for wound.  Neurological: Positive for weakness and headaches. Negative for numbness.     Physical Exam Updated Vital Signs BP (!) 116/49 (BP Location: Left Arm)    Pulse 91    Temp 99.5 F (37.5 C) (Oral)    Resp 17    Ht  (1.651 m)    Wt 96.6 kg    SpO2 95%    BMI 35.45 kg/m   Physical Exam Vitals signs and nursing note reviewed.  Constitutional:      Comments: Pt is ill and uncomfortable appearing. She is  in no acute distress.  HENT:     Head: Normocephalic and atraumatic.     Comments: No sinus or temporal tenderness.    Nose: Nose normal.     Mouth/Throat:     Mouth: Mucous membranes are dry.     Pharynx: Oropharynx is clear. No posterior oropharyngeal erythema.  Eyes:     General: No scleral icterus.    Extraocular Movements: Extraocular movements intact.     Conjunctiva/sclera: Conjunctivae normal.     Pupils: Pupils are equal, round, and reactive to light.  Neck:     Musculoskeletal: Normal range of motion. No neck rigidity or muscular tenderness.  Cardiovascular:     Rate and Rhythm: Normal rate and regular rhythm.     Pulses: Normal pulses.          Radial pulses are 2+ on the right side and 2+ on the left side.     Heart sounds: Normal heart sounds.  Pulmonary:     Effort: Pulmonary effort is normal.     Breath sounds: Normal breath sounds.  Abdominal:     General: There is no distension.     Palpations: Abdomen is soft.     Tenderness: There is no abdominal tenderness.  Musculoskeletal: Normal range of motion.  Skin:    General: Skin is warm and dry.  Neurological:     Mental Status: She is alert and oriented to person, place, and time.     Comments: Speech is clear and goal oriented, follows commands CN III-XII intact, no facial droop Normal strength in upper and lower extremities bilaterally including dorsiflexion and plantar flexion, strong and equal grip strength Sensation normal to light and sharp touch Moves extremities without ataxia, coordination intact Normal finger to nose and rapid alternating movements Normal gait and balance   Psychiatric:        Behavior: Behavior normal.      ED Treatments / Results  Labs (all labs ordered are listed, but only abnormal results are displayed) Labs Reviewed  COMPREHENSIVE METABOLIC PANEL - Abnormal; Notable for the following components:  Result Value   Sodium 127 (*)    Potassium 6.5 (*)    Chloride 91  (*)    Glucose, Bld 571 (*)    BUN 48 (*)    Creatinine, Ser 1.74 (*)    Total Protein 9.1 (*)    GFR calc non Af Amer 32 (*)    GFR calc Af Amer 37 (*)    All other components within normal limits  CBC WITH DIFFERENTIAL/PLATELET - Abnormal; Notable for the following components:   MCV 78.7 (*)    MCH 24.0 (*)    All other components within normal limits  URINALYSIS, ROUTINE W REFLEX MICROSCOPIC - Abnormal; Notable for the following components:   APPearance CLOUDY (*)    Glucose, UA >=500 (*)    Hgb urine dipstick SMALL (*)    Protein, ur 30 (*)    Nitrite POSITIVE (*)    Leukocytes,Ua LARGE (*)    WBC, UA >50 (*)    Bacteria, UA FEW (*)    All other components within normal limits  CBG MONITORING, ED - Abnormal; Notable for the following components:   Glucose-Capillary 561 (*)    All other components within normal limits  CBG MONITORING, ED - Abnormal; Notable for the following components:   Glucose-Capillary 450 (*)    All other components within normal limits  TROPONIN I    EKG None     Radiology No results found.  Procedures Procedures (including critical care time)  Medications Ordered in ED Medications  sodium chloride 0.9 % bolus 1,000 mL (0 mLs Intravenous Stopped 01/27/19 2227)  diphenhydrAMINE (BENADRYL) injection 25 mg (25 mg Intravenous Given 01/27/19 2120)  metoCLOPramide (REGLAN) injection 10 mg (10 mg Intravenous Given 01/27/19 2121)  cefTRIAXone (ROCEPHIN) 1 g in sodium chloride 0.9 % 100 mL IVPB (1 g Intravenous New Bag/Given 01/27/19 2242)  insulin aspart (novoLOG) injection 10 Units (10 Units Intravenous Given 01/27/19 2235)     Initial Impression / Assessment and Plan / ED Course  I have reviewed the triage vital signs and the nursing notes.  Pertinent labs & imaging results that were available during my care of the patient were reviewed by me and considered in my medical decision making (see chart for details).   Pt is ill-appearing, with low grade  fever on arrival. DDX includes hyperglycemia, DKA, headache, UTI.   CMP shows glucose of 571, hyperkalemia of 6.5, worsening AKI compared to labs x 2 weeks ago. BUN/Creatinine today is 48/1.74. She does not have an elevated anion gap, DKA unlikely. CBC is unremarkable.UA shows large nitrite, WBC>50, large leukocytes. Will give IV rocephin for UTI. For hyperglycemia will give 10 units regular insulin and 1 liter of IVF. Will hold off on second liter given her cardiac history. EKG is unchanged from prior and troponin is negative. Her presentation is atypical for ACS. The near syncopal episode likely caused by dehydration given poor PO intake.  Her headache improved with IV Reglan and Benadryl. Presentation is like pts typical HA and non concerning for [facility], ICH, Meningitis, or temporal arteritis. Pt has no focal neuro deficits, nuchal rigidity, or change in vision. Pt does not have neck pain. Given her hyperkalemia, hyperglycemia, AKI, UTI will consult for admission. This case was discussed with Dr. Jacqulyn Bath who  agrees with plan to admit.   Spoke with Dr. Selena Batten with hospitalist service who agrees to assume care of patient and bring into the hospital for further evaluation and management.    This note  was prepared with assistance of Conservation officer, historic buildings. Occasional wrong-word or sound-a-like substitutions may have occurred due to the inherent limitations of voice recognition software.   Final Clinical Impressions(s) / ED Diagnoses   Final diagnoses:  Hyperkalemia  Hyperglycemia  Urinary tract infection without hematuria, site unspecified  AKI (acute kidney injury) [facility])    ED Discharge Orders    None       Sherene Sires, PA-C 01/28/19 0036    Maia Plan, MD 01/28/19 [phone]

## 2019-01-27 NOTE — ED Notes (Signed)
Date and time results received: 01/27/19 2155 (use smartphrase ".now" to insert current time)  Test: potassium Critical Value: 6.5  Name of Provider Notified: Henderson Baltimore PA  Orders Received? Or Actions Taken?: none at this time

## 2019-01-27 NOTE — ED Triage Notes (Signed)
PT c/o nausea/vomiting x4 days and fainted today. Pt CBG 561 at triage.  Pt recently went to [place] and returned March 18. Pt flew to and from [place].

## 2019-01-28 ENCOUNTER — Encounter (HOSPITAL_COMMUNITY): Payer: Self-pay | Admitting: Internal Medicine

## 2019-01-28 ENCOUNTER — Observation Stay (HOSPITAL_COMMUNITY): Payer: Medicaid Other

## 2019-01-28 DIAGNOSIS — E875 Hyperkalemia: Secondary | ICD-10-CM

## 2019-01-28 DIAGNOSIS — N179 Acute kidney failure, unspecified: Secondary | ICD-10-CM

## 2019-01-28 DIAGNOSIS — R739 Hyperglycemia, unspecified: Secondary | ICD-10-CM

## 2019-01-28 DIAGNOSIS — N390 Urinary tract infection, site not specified: Secondary | ICD-10-CM | POA: Diagnosis not present

## 2019-01-28 DIAGNOSIS — E1100 Type 2 diabetes mellitus with hyperosmolarity without nonketotic hyperglycemic-hyperosmolar coma (NKHHC): Secondary | ICD-10-CM

## 2019-01-28 DIAGNOSIS — N183 Chronic kidney disease, stage 3 (moderate): Secondary | ICD-10-CM | POA: Diagnosis not present

## 2019-01-28 LAB — COMPREHENSIVE METABOLIC PANEL
ALT: 24 U/L (ref 0–44)
AST: 20 U/L (ref 15–41)
Albumin: 3.2 g/dL — ABNORMAL LOW (ref 3.5–5.0)
Alkaline Phosphatase: 71 U/L (ref 38–126)
Anion gap: 9 (ref 5–15)
BUN: 46 mg/dL — ABNORMAL HIGH (ref 6–20)
CO2: 24 mmol/L (ref 22–32)
Calcium: 8.6 mg/dL — ABNORMAL LOW (ref 8.9–10.3)
Chloride: 101 mmol/L (ref 98–111)
Creatinine, Ser: 1.59 mg/dL — ABNORMAL HIGH (ref 0.44–1.00)
GFR calc Af Amer: 41 mL/min — ABNORMAL LOW (ref >60)
GFR calc non Af Amer: 35 mL/min — ABNORMAL LOW (ref >60)
Glucose, Bld: 391 mg/dL — ABNORMAL HIGH (ref 70–99)
Potassium: 4.5 mmol/L (ref 3.5–5.1)
Sodium: 134 mmol/L — ABNORMAL LOW (ref 135–145)
Total Bilirubin: 0.2 mg/dL — ABNORMAL LOW (ref 0.3–1.2)
Total Protein: 7.2 g/dL (ref 6.5–8.1)

## 2019-01-28 LAB — CBC
HCT: 34.9 % — ABNORMAL LOW (ref 36.0–46.0)
Hemoglobin: 10.7 g/dL — ABNORMAL LOW (ref 12.0–15.0)
MCH: 24.5 pg — ABNORMAL LOW (ref 26.0–34.0)
MCHC: 30.7 g/dL (ref 30.0–36.0)
MCV: 79.9 fL — ABNORMAL LOW (ref 80.0–100.0)
Platelets: 219 10*3/uL (ref 150–400)
RBC: 4.37 MIL/uL (ref 3.87–5.11)
RDW: 15.2 % (ref 11.5–15.5)
WBC: 7.8 10*3/uL (ref 4.0–10.5)
nRBC: 0 % (ref 0.0–0.2)

## 2019-01-28 LAB — GLUCOSE, CAPILLARY
Glucose-Capillary: 350 mg/dL — ABNORMAL HIGH (ref 70–99)
Glucose-Capillary: 288 mg/dL — ABNORMAL HIGH (ref 70–99)
Glucose-Capillary: 164 mg/dL — ABNORMAL HIGH (ref 70–99)
Glucose-Capillary: 313 mg/dL — ABNORMAL HIGH (ref 70–99)
Glucose-Capillary: 326 mg/dL — ABNORMAL HIGH (ref 70–99)

## 2019-01-28 LAB — CBG MONITORING, ED: Glucose-Capillary: 392 mg/dL — ABNORMAL HIGH (ref 70–99)

## 2019-01-28 LAB — TROPONIN I: Troponin I: 0.03 ng/mL (ref <0.03)

## 2019-01-28 LAB — HEMOGLOBIN A1C
Hgb A1c MFr Bld: 13.1 % — ABNORMAL HIGH (ref 4.8–5.6)
Mean Plasma Glucose: 329.27 mg/dL

## 2019-01-28 MED ORDER — METOPROLOL SUCCINATE ER 25 MG PO TB24
12.5000 mg | ORAL_TABLET | Freq: Every day | ORAL | Status: DC
  Administered 2019-01-28 – 2019-01-30 (×3): 12.5 mg via ORAL
  Filled 2019-01-28 (×3): qty 1

## 2019-01-28 MED ORDER — LORATADINE 10 MG PO TABS
10.0000 mg | ORAL_TABLET | Freq: Every day | ORAL | Status: DC
  Administered 2019-01-28 – 2019-01-30 (×3): 10 mg via ORAL
  Filled 2019-01-28 (×3): qty 1

## 2019-01-28 MED ORDER — INSULIN ASPART 100 UNIT/ML ~~LOC~~ SOLN
0.0000 [IU] | Freq: Three times a day (TID) | SUBCUTANEOUS | Status: DC
  Administered 2019-01-28: 15:00:00 4 [IU] via SUBCUTANEOUS
  Administered 2019-01-28: 17:00:00 15 [IU] via SUBCUTANEOUS

## 2019-01-28 MED ORDER — INSULIN DETEMIR 100 UNIT/ML ~~LOC~~ SOLN
25.0000 [IU] | Freq: Every day | SUBCUTANEOUS | Status: DC
  Administered 2019-01-28 – 2019-01-30 (×3): 25 [IU] via SUBCUTANEOUS
  Filled 2019-01-28 (×4): qty 0.25

## 2019-01-28 MED ORDER — ASPIRIN EC 81 MG PO TBEC
81.0000 mg | DELAYED_RELEASE_TABLET | Freq: Every day | ORAL | Status: DC
  Administered 2019-01-28 – 2019-01-30 (×3): 81 mg via ORAL
  Filled 2019-01-28 (×3): qty 1

## 2019-01-28 MED ORDER — SODIUM CHLORIDE 0.9 % IV SOLN
1.0000 g | INTRAVENOUS | Status: DC
  Administered 2019-01-29 (×2): 1 g via INTRAVENOUS
  Filled 2019-01-28 (×2): qty 10

## 2019-01-28 MED ORDER — CALCIUM GLUCONATE-NACL 1-0.675 GM/50ML-% IV SOLN
1.0000 g | Freq: Once | INTRAVENOUS | Status: AC
  Administered 2019-01-28: 1000 mg via INTRAVENOUS
  Filled 2019-01-28: qty 50

## 2019-01-28 MED ORDER — SODIUM CHLORIDE 0.9 % IV SOLN
INTRAVENOUS | Status: DC
  Administered 2019-01-28: 03:00:00 via INTRAVENOUS

## 2019-01-28 MED ORDER — SODIUM POLYSTYRENE SULFONATE 15 GM/60ML PO SUSP
30.0000 g | Freq: Once | ORAL | Status: AC
  Administered 2019-01-28: 30 g via ORAL
  Filled 2019-01-28: qty 120

## 2019-01-28 MED ORDER — VITAMIN C 500 MG PO TABS
500.0000 mg | ORAL_TABLET | Freq: Every day | ORAL | Status: DC
  Administered 2019-01-28 – 2019-01-30 (×3): 500 mg via ORAL
  Filled 2019-01-28 (×3): qty 1

## 2019-01-28 MED ORDER — HEPARIN SODIUM (PORCINE) 5000 UNIT/ML IJ SOLN
5000.0000 [IU] | Freq: Three times a day (TID) | INTRAMUSCULAR | Status: DC
  Administered 2019-01-28 – 2019-01-29 (×4): 5000 [IU] via SUBCUTANEOUS
  Filled 2019-01-28 (×4): qty 1

## 2019-01-28 MED ORDER — ONDANSETRON HCL 4 MG/2ML IJ SOLN: 4.0000 mg | Freq: Four times a day (QID) | INTRAMUSCULAR | Status: DC | PRN

## 2019-01-28 MED ORDER — GABAPENTIN 400 MG PO CAPS
400.0000 mg | ORAL_CAPSULE | Freq: Three times a day (TID) | ORAL | Status: DC | PRN
  Administered 2019-01-28 – 2019-01-30 (×4): 400 mg via ORAL
  Filled 2019-01-28 (×4): qty 1

## 2019-01-28 MED ORDER — VITAMIN D 25 MCG (1000 UNIT) PO TABS
5000.0000 [IU] | ORAL_TABLET | Freq: Every day | ORAL | Status: DC
  Administered 2019-01-28 – 2019-01-30 (×3): 5000 [IU] via ORAL
  Filled 2019-01-28 (×3): qty 5

## 2019-01-28 MED ORDER — INSULIN ASPART 100 UNIT/ML ~~LOC~~ SOLN: 0.0000 [IU] | Freq: Three times a day (TID) | SUBCUTANEOUS | Status: DC

## 2019-01-28 MED ORDER — INSULIN DETEMIR 100 UNIT/ML ~~LOC~~ SOLN
45.0000 [IU] | Freq: Every day | SUBCUTANEOUS | Status: DC
  Filled 2019-01-28 (×3): qty 0.45

## 2019-01-28 MED ORDER — SODIUM CHLORIDE 0.9 % IV SOLN
INTRAVENOUS | Status: DC
  Administered 2019-01-28 – 2019-01-29 (×3): via INTRAVENOUS

## 2019-01-28 MED ORDER — ACETAMINOPHEN 500 MG PO TABS: 1000.0000 mg | ORAL_TABLET | Freq: Four times a day (QID) | ORAL | Status: DC | PRN

## 2019-01-28 MED ORDER — SODIUM BICARBONATE 8.4 % IV SOLN
50.0000 meq | Freq: Once | INTRAVENOUS | Status: AC
  Administered 2019-01-28: 50 meq via INTRAVENOUS
  Filled 2019-01-28: qty 50

## 2019-01-28 MED ORDER — INSULIN ASPART 100 UNIT/ML ~~LOC~~ SOLN
0.0000 [IU] | Freq: Every day | SUBCUTANEOUS | Status: DC
  Administered 2019-01-28: 21:00:00 4 [IU] via SUBCUTANEOUS

## 2019-01-28 MED ORDER — MIDODRINE HCL 5 MG PO TABS
5.0000 mg | ORAL_TABLET | Freq: Three times a day (TID) | ORAL | Status: DC
  Administered 2019-01-28 – 2019-01-30 (×7): 5 mg via ORAL
  Filled 2019-01-28 (×7): qty 1

## 2019-01-28 MED ORDER — ENSURE MAX PROTEIN PO LIQD
11.0000 [oz_av] | Freq: Two times a day (BID) | ORAL | Status: DC
  Administered 2019-01-28 – 2019-01-30 (×3): 11 [oz_av] via ORAL

## 2019-01-28 MED ORDER — INSULIN ASPART 100 UNIT/ML ~~LOC~~ SOLN
0.0000 [IU] | SUBCUTANEOUS | Status: DC
  Administered 2019-01-28: 7 [IU] via SUBCUTANEOUS
  Administered 2019-01-28: 09:00:00 5 [IU] via SUBCUTANEOUS

## 2019-01-28 MED ORDER — ROSUVASTATIN CALCIUM 20 MG PO TABS
20.0000 mg | ORAL_TABLET | Freq: Every day | ORAL | Status: DC
  Administered 2019-01-28 – 2019-01-29 (×2): 20 mg via ORAL
  Filled 2019-01-28 (×2): qty 1

## 2019-01-28 NOTE — Progress Notes (Signed)
Patient assessment and medication administration was performed utilizing Spanish interpreter services. Interpreter Christian # [phone] performed translator services. All patient questions were answered and no further questions at this time. Will continue to monitor patient.

## 2019-01-28 NOTE — Progress Notes (Signed)
Blood sugar 350

## 2019-01-28 NOTE — Clinical Social Work Note (Signed)
Patient states that she has been seeing Dr. Alfonse Ras as her PCP. She was provided a list of PCPs. She stated that she likes Dr. Tito Dine and she wants to keep seeing this physician. She did indicate that if she wanted a new PCP she would call physicians on the list.     Dmarcus Decicco, Juleen China, LCSW

## 2019-01-28 NOTE — Progress Notes (Signed)
Patient instructed on the use of Bydureon Pen. Instructions were demonstrated and performed with the assistance of Spanish interpreter Harriett Sine [phone]. Patient verbalized and demonstrated understanding of Bydureon pen. In addition patient had return demonstration of subcutaneous injection via insulin syringe. All questions were answered and no further questions at this time.

## 2019-01-28 NOTE — Progress Notes (Signed)
Initial Nutrition Assessment  DOCUMENTATION CODES:  Obesity unspecified  INTERVENTION:  Ensure Max protein supplement BID, each supplement provides 150kcal and 30g of protein.  Spoke w/ pharmacy regarding assistance w/ med teaching. Discussed pts education needs with nursing.   NUTRITION DIAGNOSIS:  Limited adherence to nutrition-related recommendations related to Lack of desire/lack of knowledge as evidenced by self report of skipping meals despite being counseled against.  GOAL:  Patient will meet greater than or equal to 90% of their needs  MONITOR:  PO intake, Supplement acceptance, Weight trends, I & O's, Labs  REASON FOR ASSESSMENT:  Malnutrition Screening Tool    ASSESSMENT:  58 y/o female PMHx DM2, CAD s/p CABG, HTN, HLD. Presented w/ n/v x4 days. Reported not taking insulin x3 days. Also has new medication but doesn't know how to use it. In ED, found to be AKI w/ BG >500 and K 6.5. Also has UA suggesting UTI. Admitted for IVF/ IV abx.  Spoke with pt w/ use of translator device.   Pt reports intractable n/v x4 days. Nothing would stay down. She tried to eat items such as rice, beans, chicken and pork.   Prior to onset of GI upset, she reports only partially following a DM diet. She endorses skipping meals. SHe does check her BG 2x/day. Chart shows longstanding history of uncontrolled DM2. A1C on 3/25 was 12.5. She was just seen by the OP dietitian at Dr. Dennie Bible office a few weeks ago.    She gives a UBW of 230 lbs. She was admitted at 217 lbs, though was dehydrated initially. After being rehydrated, she is 224.7 lbs. Per chart, her weight has fluctuated between 220 and 230 for the past several years, though She actually had just been weighed OP on 4/2 at 213 lbs.   At this time, pt reports she still does not know how to use this new medication of hers. She brought it in with her today. It is called Bydureon Pen, once weekly injection. None of the instructions are in spanish.  RD spoke with pharmacy to see if they could assist in teaching pt. PharmD brought down translated ed materials for pt. PharmD explained how to administer med.  Nurse will  demonstrate to pt how to administer medication later today.   Nutritionally, RD reminded her it is important she not skip meals. This appears to be a recurrent issue as it documented as a problem at her recent OP dietitian appt. RD asked her if she knew what foods had carbs. She said she did. SHe has no dm questions at this time.   Will order Ensure Max to help pt meet protein needs   Labs:  BGs: 391->288->164, Albumin:3.2, K: 6.5-> 4.5 Meds: Vit D3, Insulin, Vit C, IVF, IV abx  Recent Labs  Lab 01/27/19 2110 01/28/19 0521  NA 127* 134*  K 6.5* 4.5  CL 91* 101  CO2 26 24  BUN 48* 46*  CREATININE 1.74* 1.59*  CALCIUM 9.2 8.6*  GLUCOSE 571* 391*   NUTRITION - FOCUSED PHYSICAL EXAM: WDL   Diet Order:   Diet Order            Diet Carb Modified Fluid consistency: Thin; Room service appropriate? Yes  Diet effective now             EDUCATION NEEDS:  Education needs have been addressed  Skin:  Skin Assessment: Reviewed RN Assessment  Last BM:  4/9  Height:  Ht Readings from Last 1 Encounters:  01/28/19  5\' 5"  (1.651 m)   Weight:  Wt Readings from Last 1 Encounters:  01/28/19 101.9 kg   Wt Readings from Last 10 Encounters:  01/28/19 101.9 kg  09/10/18 108.3 kg  09/06/18 107.5 kg  09/06/18 107.5 kg  08/24/18 107.5 kg  07/14/18 100.7 kg  07/07/18 117.9 kg  07/07/18 99.3 kg  06/01/18 117.9 kg  04/29/18 98 kg   Ideal Body Weight:  56.82 kg  BMI:  Body mass index is 37.38 kg/m.  Estimated Nutritional Needs:  Kcal:  1650-1850 (msj +/-100) Protein:  74-85g (1.3-1.5g/kg bw) Fluid:  1.7-1.9 L fluid (1 ml/kcal)  Christophe Louis RD, LDN, CNSC Clinical Nutrition Available Tues-Sat via Pager: [phone] 01/28/2019 2:52 PM

## 2019-01-28 NOTE — Progress Notes (Signed)
PROGRESS NOTE  Jill [name] QZE:[id number] DOB: 07-Nov-1960 DOA: 01/27/2019 PCP: [facility], [facility]  Brief History:  58 year old female with a history of coronary artery disease status post CABG, diabetes mellitus type 2, uncontrolled, hypertension, hyperlipidemia, CKD stage III presenting with 3 to 4-day history of generalized weakness, urinary frequency associated with 1 day of nausea and vomiting.  The patient had complained of a bifrontal headache that was slightly used worse than usual but denied any fevers, chills, sore throat, coughing, shortness of breath.  She denies any sick contacts.  The patient recently traveled back from her native [place] on 01/05/2019.  She states that since she has returned to [place], she has been following recommendations for social distancing in the setting of the coronavirus pandemic.  She denies any sick contacts or COVID-19 positive contacts.  On the evening of 01/27/2019, the patient had emesis as discussed that resulted in a syncopal episode.  She denied any palpitations, aura, or other prodromal type symptoms.  The patient stated that she went to see her family physician approximately 1 week prior to this admission.  Apparently, the patient states that her insulin was changed, but she did not understand the directions.  As result, the patient stopped taking her insulin altogether.  The patient stated that she has not had any insulin for 4 days prior to this admission.  Review of the medical record shows she saw her PCP,  Dr. Otilio Miu, in [place] on 01/20/19, and she was told to change her levemir to 45 units in am and to increase metformin to 1000 mg bid from 500 once daily.  From her last visit with Endocrinology, Dr. Fransico Him on 09/06/18 she was told to decrease her levemir to 40 units once daily and to increase her Trulicity.  At the time of presentation, the patient was noted to have a potassium of 6.5 with serum creatinine 1.74.  The patient was  given Kayexalate and other temporizing measures for potassium and started on IV fluids and insulin.  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 BMP  Coronary artery disease/ischemic cardiomyopathy -Status post three-vessel CABG 07/21/2017 -No chest pain presently -Continue aspirin 81 mg daily -Continue statin  Pulmonary hypertension/mitral regurgitation -07/22/2017 intraoperative TEE--mild MR, EF 50-55% -Outpatient cardiology follow-up  Orthostatic hypotension -Continue midodrine    Disposition Plan:   Home in 1-2 days  Family Communication:   No Family at bedside  Consultants:  none  Code Status:  FULL   DVT Prophylaxis:  [place] Heparin   Procedures: As Listed in Progress Note Above  Antibiotics: Ceftriaxone 4/9>>>   Total time spent 40 minutes.  Greater than 50% spent face to face counseling and coordinating care.     Subjective: Patient is feeling better this morning.  Her  nausea and vomiting have improved.  She feels hungry wants to eat.  She states her headaches improving.  She denies any fevers, chills, sore throat, chest pain, shortness breath, vomiting, diarrhea, abdominal pain, dysuria,  hematuria.  Objective: Vitals:   01/28/19 0221 01/28/19 0221 01/28/19 0508 01/28/19 0842  BP:  130/60 (!) 129/51 (!) 105/93  Pulse:  74 73 96  Resp:  18 20   Temp:  99 F (37.2 C) 97.7 F (36.5 C)   TempSrc:  Oral Oral   SpO2:  95% 92%   Weight: 98.2 kg  101.9 kg   Height: 5\' 5"  (1.651 m)       Intake/Output Summary (Last 24 hours) at 01/28/2019 1552 Last data filed at 01/28/2019 0558 Gross per 24 hour  Intake 1306.01 ml  Output --  Net 1306.01 ml   Weight change:  Exam:   General:  Pt is alert, follows commands appropriately, not in acute distress  HEENT: No icterus, No thrush, No neck mass, [place]/AT  Cardiovascular: RRR, S1/S2, no rubs, no gallops  Respiratory: CTA bilaterally, no wheezing, no crackles, no rhonchi  Abdomen: Soft/+BS, non tender, non distended, no guarding  Extremities: No edema, No lymphangitis, No petechiae, No rashes, no synovitis   Data Reviewed: I have personally reviewed following labs and imaging studies Basic Metabolic Panel: Recent Labs  Lab 01/27/19 2110 01/28/19 0521  NA 127* 134*  K 6.5* 4.5  CL 91* 101  CO2 26 24  GLUCOSE 571* 391*  BUN 48* 46*  CREATININE 1.74* 1.59*  CALCIUM 9.2 8.6*   Liver Function Tests: Recent Labs  Lab 01/27/19 2110 01/28/19 0521  AST 20 20  ALT 31 24  ALKPHOS 98 71  BILITOT 0.6 0.2*  PROT 9.1* 7.2  ALBUMIN 4.1 3.2*   No results for input(s): LIPASE, AMYLASE in the last 168 hours. No results for input(s): AMMONIA in the last 168 hours. Coagulation Profile: No results for input(s): INR, PROTIME in the last 168 hours. CBC: Recent Labs  Lab 01/27/19 2110 01/28/19 0521  WBC 8.8 7.8  NEUTROABS 6.5  --   HGB 12.2 10.7*  HCT 40.0 34.9*  MCV 78.7* 79.9*  PLT 282 219   Cardiac Enzymes: Recent Labs  Lab 01/27/19 2110 01/28/19 0521  TROPONINI <0.03 0.03*   BNP: Invalid input(s): POCBNP CBG: Recent Labs  Lab 01/27/19 2259 01/28/19 0106 01/28/19 0403 01/28/19 0729 01/28/19 1141   GLUCAP 450* 392* 350* 288* 164*   HbA1C: Recent Labs    01/27/19 2110  HGBA1C 13.1*   Urine analysis:    Component Value Date/Time   COLORURINE YELLOW 01/27/2019 2055   APPEARANCEUR CLOUDY (A) 01/27/2019 2055   LABSPEC 1.018 01/27/2019 2055   PHURINE 6.0 01/27/2019 2055   GLUCOSEU >=500 (A) 01/27/2019 2055   HGBUR SMALL (A) 01/27/2019 2055   BILIRUBINUR NEGATIVE 01/27/2019 2055   KETONESUR NEGATIVE 01/27/2019 2055   PROTEINUR 30 (A) 01/27/2019 2055   UROBILINOGEN 0.2 05/20/2013 0943   NITRITE POSITIVE (A) 01/27/2019 2055   LEUKOCYTESUR LARGE (A) 01/27/2019 2055   Sepsis Labs: @LABRCNTIP (procalcitonin:4,lacticidven:4) )No results found for this or any previous visit (from the past 240 hour(s)).   Scheduled Meds:  aspirin EC  81 mg Oral Daily   cholecalciferol  5,000 Units Oral Daily   heparin  5,000 Units Subcutaneous Q8H   insulin aspart  0-20 Units Subcutaneous TID WC   insulin aspart  0-5 Units Subcutaneous QHS   insulin detemir  25 Units Subcutaneous Daily   loratadine  10 mg Oral Daily   metoprolol succinate  12.5 mg Oral Daily   midodrine  5 mg Oral TID WC   Ensure Max Protein  11 oz Oral BID   rosuvastatin  20 mg Oral QHS   vitamin C  500 mg Oral Daily   Continuous Infusions:  sodium chloride 100 mL/hr at 01/28/19 1457   cefTRIAXone (ROCEPHIN)  IV      Procedures/Studies: Dg Chest Port 1 View  Result Date: 01/28/2019 CLINICAL DATA:  Nausea and vomiting. EXAM: PORTABLE CHEST 1 VIEW COMPARISON:  07/07/2018 FINDINGS: Post median sternotomy and CABG. Borderline cardiomegaly. No focal airspace disease, pulmonary edema, pleural effusion or pneumothorax. IMPRESSION: No active disease. Electronically Signed   By: Narda Rutherford M.D.   On: 01/28/2019 02:16    Catarina Hartshorn, DO  [facility] Pager [phone]  If 7PM-7AM, please contact night-coverage [email or web address] Password [email or web address] 01/28/2019, 3:52 PM   LOS: 0 days

## 2019-01-28 NOTE — H&P (Signed)
TRH H&P    Patient Demographics:    Jill [name], is a 58 y.o. female  MRN: [id number]  DOB - Mar 11, 1961  Admit Date - 01/27/2019  Referring MD/NP/PA:   Doug Sou PA  Outpatient Primary MD for the patient is [facility], [facility]  Patient coming from: home  Chief complaint-  hyperglycemia   HPI:    Jill [name]  is a 58 y.o. female,  w hx of dm2, neuropathy, CAD, hyperkalemia apparently presents due to nausea/ vomitting and hyperglycemia.  Pt denies any medication noncompliance.  Pt unclear due to language barrier if having dysuria.   Pt denies fever, chills, cough, cp, palp, sob, abd pain, diarrhea, brbpr, black stool.  Pt presented due to hyperglycemia.   In ED,  Pt bp 102/56,  Borderline low.    Blood sugar 571. Potassium 6.5   Bun 48, Creatinine 1.74.   Urinalysis wbc >50, rbc 0-5,   Pt given calcium gluconate, sodium bicarb kayexalate , reglan  iv x1  and rocephin 1gm iv x1.   Pt will be admitted for UTI, n/v, hyperglycemia, and hyperkalemia.      Review of systems:    In addition to the HPI above,  No Fever-chills, No Headache, No changes with Vision or hearing, No problems swallowing food or Liquids, No Chest pain, Cough or Shortness of Breath, No Abdominal pain, bowel movements are regular, No Blood in stool or Urine, No dysuria, No new skin rashes or bruises, No new joints pains-aches,  No new weakness, tingling, numbness in any extremity, No recent weight gain or loss, No polyuria, polydypsia or polyphagia, No significant Mental Stressors.  All other systems reviewed and are negative.    Past History of the following :    Past Medical History:  Diagnosis Date  . CAD (coronary artery disease)    a. s/p CABG in 07/2017 with LIMA-LAD, SVG-D1, SVG-OM2, and SVG-PDA  . Diabetic neuropathy (HCC)   . Dyspnea   . Dysrhythmia   . Headache   .  Hyperkalemia   . Hyperlipidemia   . Hypertension   . Type 2 diabetes mellitus (HCC)       Past Surgical History:  Procedure Laterality Date  . CORONARY ARTERY BYPASS GRAFT N/A 07/22/2017   Procedure: CORONARY ARTERY BYPASS GRAFTING (CABG) x four, using left internal mammary artery and right leg greater saphenous vein harvested endoscopically;  Surgeon: Loreli Slot, MD;  Location: [facility] OR;  Service: Open Heart Surgery;  Laterality: N/A;  . RIGHT/LEFT HEART CATH AND CORONARY ANGIOGRAPHY N/A 06/26/2017   Procedure: RIGHT/LEFT HEART CATH AND CORONARY ANGIOGRAPHY;  Surgeon: Marykay Lex, MD;  Location: [facility] INVASIVE CV LAB;  Service: Cardiovascular;  Laterality: N/A;  . TEE WITHOUT CARDIOVERSION N/A 06/25/2017   Procedure: TRANSESOPHAGEAL ECHOCARDIOGRAM (TEE);  Surgeon: Thurmon Fair, MD;  Location: [facility] ENDOSCOPY;  Service: Cardiovascular;  Laterality: N/A;  . TEE WITHOUT CARDIOVERSION N/A 07/22/2017   Procedure: TRANSESOPHAGEAL ECHOCARDIOGRAM (TEE);  Surgeon: Loreli Slot, MD;  Location: [facility] OR;  Service: Open Heart Surgery;  Laterality:  N/A;  . TUBAL LIGATION        Social History:      Social History   Tobacco Use  . Smoking status: Never Smoker  . Smokeless tobacco: Never Used  Substance Use Topics  . Alcohol use: No       Family History :     Family History  Problem Relation Age of Onset  . Diabetes type II Other   . Hypertension Other        Home Medications:   Prior to Admission medications   Medication Sig Start Date End Date Taking? Authorizing Provider  aspirin EC 81 MG tablet Take 81 mg by mouth daily.   Yes [provider]  Cholecalciferol (VITAMIN D3) 125 MCG (5000 UT) CAPS Take 1 capsule (5,000 Units total) by mouth daily. 09/06/18  Yes Nida, Denman George, MD  Dulaglutide (TRULICITY) 1.5 MG/0.5ML SOPN Inject 1.5 mg into the skin once a week. 09/06/18  Yes Roma Kayser, MD  furosemide (LASIX) 20 MG tablet Take 1 tablet (20  mg total) by mouth daily with breakfast. 08/24/18  Yes Nida, Denman George, MD  gabapentin (NEURONTIN) 400 MG capsule Take 400 mg by mouth 3 (three) times daily as needed (foot pain).    Yes [provider]  loratadine (CLARITIN) 10 MG tablet Take 10 mg by mouth daily.   Yes [provider]  metoprolol succinate (TOPROL-XL) 25 MG 24 hr tablet Take 0.5 tablets (12.5 mg total) by mouth daily. 09/10/18 09/05/19 Yes Strader, Lennart Pall, PA-C  midodrine (PROAMATINE) 5 MG tablet Take 1 tablet (5 mg total) by mouth 3 (three) times daily with meals. 09/10/18  Yes Strader, [place] M, PA-C  rosuvastatin (CRESTOR) 20 MG tablet Take 1 tablet (20 mg total) by mouth at bedtime. 09/10/18  Yes Strader, [place] M, PA-C  vitamin C (ASCORBIC ACID) 500 MG tablet Take 500 mg by mouth daily.   Yes [provider]  acetaminophen (TYLENOL) 500 MG tablet Take 2 tablets (1,000 mg total) by mouth every 6 (six) hours as needed. 07/27/17   Barrett, Erin R, PA-C  ibuprofen (ADVIL,MOTRIN) 600 MG tablet Take 1 tablet (600 mg total) by mouth every 6 (six) hours as needed for moderate pain. 06/01/18   Burgess Amor, PA-C  insulin detemir (LEVEMIR) 100 UNIT/ML injection Inject 45 Units into the skin at bedtime.     [provider]  silver sulfADIAZINE (SILVADENE) 1 % cream Apply 1 application topically daily.    [provider]  terbinafine (LAMISIL) 250 MG tablet Take 1 tablet (250 mg total) by mouth daily. 07/12/18   Felecia Shelling, DPM  traMADol (ULTRAM) 50 MG tablet Take 1 tablet (50 mg total) by mouth every 6 (six) hours as needed. 06/01/18   Burgess Amor, PA-C     Allergies:    No Known Allergies   Physical Exam:   Vitals  Blood pressure (!) 102/56, pulse 73, temperature 99.5 F (37.5 C), temperature source Oral, resp. rate 14, height  (1.651 m), weight 96.6 kg, SpO2 91 %.  1.  General: Axox3, speaks only spanish  2. Psychiatric: No mania, euthymic  3. Neurologic:  cn2-12 intact, reflexes 2+ symmetric, diffuse with downgoing toes bilaterally.   4. HEENMT:  Anicteric, pupils 1.79mm symmetric, direct, consensual intact, no conjunctivitis  5. Respiratory : CTAB  6. Cardiovascular : rrr s1, s2, 1/6 sem apex   7. Gastrointestinal:  abd soft, nt, nd, +bs  8. Skin:  No c/c/e  9.Musculoskeletal:  Good ROM    Data Review:    CBC Recent Labs  Lab 01/27/19 2110  WBC 8.8  HGB 12.2  HCT 40.0  PLT 282  MCV 78.7*  MCH 24.0*  MCHC 30.5  RDW 15.3  LYMPHSABS 1.5  MONOABS 0.6  EOSABS 0.1  BASOSABS 0.1   ------------------------------------------------------------------------------------------------------------------  Results for orders placed or performed during the hospital encounter of 01/27/19 (from the past 48 hour(s))  CBG monitoring, ED     Status: Abnormal   Collection Time: 01/27/19  8:16 PM  Result Value Ref Range   Glucose-Capillary 561 (HH) 70 - 99 mg/dL   Comment 1 Notify RN   Urinalysis, Routine w reflex microscopic     Status: Abnormal   Collection Time: 01/27/19  8:55 PM  Result Value Ref Range   Color, Urine YELLOW YELLOW   APPearance CLOUDY (A) CLEAR   Specific Gravity, Urine 1.018 1.005 - 1.030   pH 6.0 5.0 - 8.0   Glucose, UA >=500 (A) NEGATIVE mg/dL   Hgb urine dipstick SMALL (A) NEGATIVE   Bilirubin Urine NEGATIVE NEGATIVE   Ketones, ur NEGATIVE NEGATIVE mg/dL   Protein, ur 30 (A) NEGATIVE mg/dL   Nitrite POSITIVE (A) NEGATIVE   Leukocytes,Ua LARGE (A) NEGATIVE   RBC / HPF 0-5 0 - 5 RBC/hpf   WBC, UA >50 (H) 0 - 5 WBC/hpf   Bacteria, UA FEW (A) NONE SEEN   Squamous Epithelial / LPF 0-5 0 - 5   WBC Clumps PRESENT     Comment: Performed at [facility] [address], [place], [place] [address]  Comprehensive metabolic panel     Status: Abnormal   Collection Time: 01/27/19  9:10 PM  Result Value Ref Range   Sodium 127 (L) 135 - 145 mmol/L   Potassium 6.5 (HH) 3.5 - 5.1 mmol/L    Comment: CRITICAL RESULT  CALLED TO, READ BACK BY AND VERIFIED WITH: MOORE,M ON 01/27/19 AT 2150 BY LOY,C    Chloride 91 (L) 98 - 111 mmol/L   CO2 26 22 - 32 mmol/L   Glucose, Bld 571 (HH) 70 - 99 mg/dL    Comment: CRITICAL RESULT CALLED TO, READ BACK BY AND VERIFIED WITH: MOORE,M ON 01/27/19 AT 2150 BY LOY,C    BUN 48 (H) 6 - 20 mg/dL   Creatinine, Ser 9.81 (H) 0.44 - 1.00 mg/dL   Calcium 9.2 8.9 - 19.1 mg/dL   Total Protein 9.1 (H) 6.5 - 8.1 g/dL   Albumin 4.1 3.5 - 5.0 g/dL   AST 20 15 - 41 U/L   ALT 31 0 - 44 U/L   Alkaline Phosphatase 98 38 - 126 U/L   Total Bilirubin 0.6 0.3 - 1.2 mg/dL   GFR calc non Af Amer 32 (L) >60 mL/min   GFR calc Af Amer 37 (L) >60 mL/min   Anion gap 10 5 - 15    Comment: Performed at [facility], [address], [place], [place] [address]  CBC with Differential     Status: Abnormal   Collection Time: 01/27/19  9:10 PM  Result Value Ref Range   WBC 8.8 4.0 - 10.5 K/uL   RBC 5.08 3.87 - 5.11 MIL/uL   Hemoglobin 12.2 12.0 - 15.0 g/dL   HCT 56.2 13.0 - 86.5 %   MCV 78.7 (L) 80.0 - 100.0 fL   MCH 24.0 (L) 26.0 - 34.0 pg   MCHC 30.5 30.0 - 36.0 g/dL   RDW 78.4 69.6 - 29.5 %  Platelets 282 150 - 400 K/uL   nRBC 0.0 0.0 - 0.2 %   Neutrophils Relative % 75 %   Neutro Abs 6.5 1.7 - 7.7 K/uL   Lymphocytes Relative 17 %   Lymphs Abs 1.5 0.7 - 4.0 K/uL   Monocytes Relative 6 %   Monocytes Absolute 0.6 0.1 - 1.0 K/uL   Eosinophils Relative 1 %   Eosinophils Absolute 0.1 0.0 - 0.5 K/uL   Basophils Relative 1 %   Basophils Absolute 0.1 0.0 - 0.1 K/uL   Immature Granulocytes 0 %   Abs Immature Granulocytes 0.02 0.00 - 0.07 K/uL    Comment: Performed at [facility], [address] [place], [place] [address]  Troponin I - ONCE - STAT     Status: None   Collection Time: 01/27/19  9:10 PM  Result Value Ref Range   Troponin I <0.03 <0.03 ng/mL    Comment: Performed at [facility], [address] [place], [place] [address]  CBG monitoring, ED     Status: Abnormal   Collection  Time: 01/27/19 10:59 PM  Result Value Ref Range   Glucose-Capillary 450 (H) 70 - 99 mg/dL    Chemistries  Recent Labs  Lab 01/27/19 2110  NA 127*  K 6.5*  CL 91*  CO2 26  GLUCOSE 571*  BUN 48*  CREATININE 1.74*  CALCIUM 9.2  AST 20  ALT 31  ALKPHOS 98  BILITOT 0.6   ------------------------------------------------------------------------------------------------------------------  ------------------------------------------------------------------------------------------------------------------ GFR: Estimated Creatinine Clearance: 40.5 mL/min (A) (by C-G formula based on SCr of 1.74 mg/dL (H)). Liver Function Tests: Recent Labs  Lab 01/27/19 2110  AST 20  ALT 31  ALKPHOS 98  BILITOT 0.6  PROT 9.1*  ALBUMIN 4.1   No results for input(s): LIPASE, AMYLASE in the last 168 hours. No results for input(s): AMMONIA in the last 168 hours. Coagulation Profile: No results for input(s): INR, PROTIME in the last 168 hours. Cardiac Enzymes: Recent Labs  Lab 01/27/19 2110  TROPONINI <0.03   BNP (last 3 results) No results for input(s): PROBNP in the last 8760 hours. HbA1C: No results for input(s): HGBA1C in the last 72 hours. CBG: Recent Labs  Lab 01/27/19 2016 01/27/19 2259  GLUCAP 561* 450*   Lipid Profile: No results for input(s): CHOL, HDL, LDLCALC, TRIG, CHOLHDL, LDLDIRECT in the last 72 hours. Thyroid Function Tests: No results for input(s): TSH, T4TOTAL, FREET4, T3FREE, THYROIDAB in the last 72 hours. Anemia Panel: No results for input(s): VITAMINB12, FOLATE, FERRITIN, TIBC, IRON, RETICCTPCT in the last 72 hours.  --------------------------------------------------------------------------------------------------------------- Urine analysis:    Component Value Date/Time   COLORURINE YELLOW 01/27/2019 2055   APPEARANCEUR CLOUDY (A) 01/27/2019 2055   LABSPEC 1.018 01/27/2019 2055   PHURINE 6.0 01/27/2019 2055   GLUCOSEU >=500 (A) 01/27/2019 2055   HGBUR  SMALL (A) 01/27/2019 2055   BILIRUBINUR NEGATIVE 01/27/2019 2055   KETONESUR NEGATIVE 01/27/2019 2055   PROTEINUR 30 (A) 01/27/2019 2055   UROBILINOGEN 0.2 05/20/2013 0943   NITRITE POSITIVE (A) 01/27/2019 2055   LEUKOCYTESUR LARGE (A) 01/27/2019 2055      Imaging Results:    No results found.    Assessment & Plan:    Principal Problem:   Hyperkalemia Active Problems:   Hyperglycemia   CKD (chronic kidney disease) stage 3, GFR 30-59 ml/min (HCC)   Acute lower UTI  Hyperglycemia Cont levemir 45 units [place] qday Hydrate with ns iv Check fsbs q4h, ISS  Hyperkalemia Sodium bicarb 1 amp iv x1 Calcium  gluconate 1gm iv x1 Kayexalate 30gm po x1 Check cmp in am  N/v STOP Bydureon (can cause n/v) STOP Metformin  (pt has 2 metformin bottles with her, apparently taking 1000mg  po bid),  w history of CKD stage3 please do not restart on discharge.  Zofran 4mg  iv q6h prn   UTI tx with Rocephin 1gm iv qday Please f/u on urine culture  CKD stage 3 Check cmp in am    DVT Prophylaxis-   heparin- SCDs  AM Labs Ordered, also please review Full Orders  Family Communication: Admission, patients condition and plan of care including tests being ordered have been discussed with the patient who indicate understanding and agree with the plan and Code Status.  Code Status:   FULL CODE  Admission status: Observation/Inpatient: Based on patients clinical presentation and evaluation of above clinical data, I have made determination that patient meets observation criteria at this time.  Time spent in minutes : 70   Pearson Grippe M.D on 01/28/2019 at 1:06 AM

## 2019-01-29 DIAGNOSIS — E1100 Type 2 diabetes mellitus with hyperosmolarity without nonketotic hyperglycemic-hyperosmolar coma (NKHHC): Secondary | ICD-10-CM | POA: Diagnosis not present

## 2019-01-29 DIAGNOSIS — E875 Hyperkalemia: Secondary | ICD-10-CM | POA: Diagnosis not present

## 2019-01-29 DIAGNOSIS — N183 Chronic kidney disease, stage 3 (moderate): Secondary | ICD-10-CM | POA: Diagnosis not present

## 2019-01-29 DIAGNOSIS — N179 Acute kidney failure, unspecified: Secondary | ICD-10-CM | POA: Diagnosis not present

## 2019-01-29 LAB — HIV ANTIBODY (ROUTINE TESTING W REFLEX): HIV Screen 4th Generation wRfx: NONREACTIVE

## 2019-01-29 LAB — BASIC METABOLIC PANEL
Anion gap: 7 (ref 5–15)
BUN: 32 mg/dL — ABNORMAL HIGH (ref 6–20)
CO2: 26 mmol/L (ref 22–32)
Calcium: 8.4 mg/dL — ABNORMAL LOW (ref 8.9–10.3)
Chloride: 107 mmol/L (ref 98–111)
Creatinine, Ser: 1.19 mg/dL — ABNORMAL HIGH (ref 0.44–1.00)
GFR calc Af Amer: 58 mL/min — ABNORMAL LOW (ref >60)
GFR calc non Af Amer: 50 mL/min — ABNORMAL LOW (ref >60)
Glucose, Bld: 151 mg/dL — ABNORMAL HIGH (ref 70–99)
Potassium: 4.5 mmol/L (ref 3.5–5.1)
Sodium: 140 mmol/L (ref 135–145)

## 2019-01-29 LAB — CBC
HCT: 36.6 % (ref 36.0–46.0)
Hemoglobin: 10.9 g/dL — ABNORMAL LOW (ref 12.0–15.0)
MCH: 24.4 pg — ABNORMAL LOW (ref 26.0–34.0)
MCHC: 29.8 g/dL — ABNORMAL LOW (ref 30.0–36.0)
MCV: 81.9 fL (ref 80.0–100.0)
Platelets: 231 10*3/uL (ref 150–400)
RBC: 4.47 MIL/uL (ref 3.87–5.11)
RDW: 15.8 % — ABNORMAL HIGH (ref 11.5–15.5)
WBC: 7.5 10*3/uL (ref 4.0–10.5)
nRBC: 0 % (ref 0.0–0.2)

## 2019-01-29 LAB — GLUCOSE, CAPILLARY
Glucose-Capillary: 129 mg/dL — ABNORMAL HIGH (ref 70–99)
Glucose-Capillary: 134 mg/dL — ABNORMAL HIGH (ref 70–99)
Glucose-Capillary: 153 mg/dL — ABNORMAL HIGH (ref 70–99)
Glucose-Capillary: 190 mg/dL — ABNORMAL HIGH (ref 70–99)
Glucose-Capillary: 235 mg/dL — ABNORMAL HIGH (ref 70–99)
Glucose-Capillary: 306 mg/dL — ABNORMAL HIGH (ref 70–99)

## 2019-01-29 MED ORDER — INSULIN ASPART 100 UNIT/ML ~~LOC~~ SOLN: 0.0000 [IU] | Freq: Every day | SUBCUTANEOUS | Status: DC

## 2019-01-29 MED ORDER — INSULIN ASPART 100 UNIT/ML ~~LOC~~ SOLN: 0.0000 [IU] | Freq: Three times a day (TID) | SUBCUTANEOUS | Status: DC

## 2019-01-29 MED ORDER — INSULIN ASPART 100 UNIT/ML ~~LOC~~ SOLN
0.0000 [IU] | Freq: Three times a day (TID) | SUBCUTANEOUS | Status: DC
  Administered 2019-01-29: 15 [IU] via SUBCUTANEOUS
  Administered 2019-01-29 – 2019-01-30 (×2): 7 [IU] via SUBCUTANEOUS

## 2019-01-29 NOTE — Progress Notes (Signed)
Patient assessed and medications discussed and administered to patient utilizing Spanish interpreter services. Lilia # [id number] Spanish interpreter used for this encounter. All patient questions were answered and no further questions at this time. Patient denies pain or distress at this time.

## 2019-01-29 NOTE — Progress Notes (Signed)
PROGRESS NOTE  Jill [name] SWF:[id number] DOB: [birth year] DOA: 01/27/2019 PCP: [facility]  Brief History:  58 year old female with a history of coronary artery disease status post CABG, diabetes mellitus type 2, uncontrolled, hypertension, hyperlipidemia, CKD stage III presenting with 3 to 4-day history of generalized weakness, urinary frequency associated with 1 day of nausea and vomiting.  The patient had complained of a bifrontal headache that was slightly used worse than usual but denied any fevers, chills, sore throat, coughing, shortness of breath.  She denies any sick contacts.  The patient recently traveled back from her native [place] on 01/05/2019.  She states that since she has returned to [place], she has been following recommendations for social distancing in the setting of the coronavirus pandemic.  She denies any sick contacts or COVID-19 positive contacts.  On the evening of 01/27/2019, the patient had emesis as discussed that resulted in a syncopal episode.  She denied any palpitations, aura, or other prodromal type symptoms.  The patient stated that she went to see her family physician approximately 1 week prior to this admission.  Apparently, the patient states that her insulin was changed, but she did not understand the directions.  As result, the patient stopped taking her insulin altogether.  The patient stated that she has not had any insulin for 4 days prior to this admission.  Review of the medical record shows she saw her PCP,  Dr. Otilio Miu, in [place] on 01/20/19, and she was told to change her levemir to 45 units in am and to increase metformin to 1000 mg bid from 500 once daily.  She was also started on Byetta. From her last visit with Endocrinology, Dr. Fransico Him on 09/06/18 she was told to decrease her levemir to 40 units once daily and to increase her Trulicity.  At the time of presentation, the patient was noted to have a potassium of 6.5 with serum  creatinine 1.74.  The patient was given Kayexalate and other temporizing measures for potassium and started on IV fluids and insulin with clinical improvement.  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 -this has been a recurrent problem upon review of medical record  Citrobacter koseri UTI -underlying UTI which may have also contributed to the patient's elevated CBGs -Continue ceftriaxone pending culture data  Diabetes mellitus type 2, uncontrolled with hyperglycemia and neuropathy -Resistant NovoLog sliding scale -Reduced dose Levemir during the hospitalization -Continue gabapentin -pt was told to stop Trulicity and start Byetta at last PCP appointment due to insurance not paying for Trulicity  Acute on chronic renal failure--CKD stage III -Baseline creatinine 1.1-1.4 -serum creatinine peaked 1.74  Coronary artery disease/ischemic cardiomyopathy -Status post three-vessel CABG 07/21/2017 -No chest pain presently -Continue aspirin 81 mg daily -Continue statin  Pulmonary hypertension/mitral regurgitation -07/22/2017 intraoperative TEE--mild MR, EF 50-55% -Outpatient cardiology follow-up  Orthostatic hypotension -Continue midodrine    Disposition Plan:   Home 4/12 if stable Family Communication:   No Family at bedside  Consultants:  none  Code Status:  FULL   DVT Prophylaxis:  Nord Heparin   Procedures: As Listed in Progress Note Above  Antibiotics: Ceftriaxone 4/9>>>  Total time spent 35 minutes.  Greater than 50% spent face to face  counseling and coordinating care.    Subjective: Patient denies fevers, chills, headache, chest pain, dyspnea, nausea, vomiting, diarrhea, abdominal pain, dysuria,  hematuria, hematochezia, and melena.   Objective: Vitals:   01/28/19 2203 01/29/19 0644 01/29/19 0913 01/29/19 1300  BP: (!) 148/57 (!) 108/50 (!) 102/39 (!) 112/54  Pulse: 77 77  85  Resp: 16 16  17   Temp: 98.3 F (36.8 C) 98.6 F (37 C)  98.6 F (37 C)  TempSrc: Oral Oral  Oral  SpO2: 96% 98%  (!) 8%  Weight:  97.1 kg    Height:        Intake/Output Summary (Last 24 hours) at 01/29/2019 1533 Last data filed at 01/29/2019 1300 Gross per 24 hour  Intake 1729.35 ml  Output -  Net 1729.35 ml   Weight change: 0.484 kg Exam:   General:  Pt is alert, follows commands appropriately, not in acute distress  HEENT: No icterus, No thrush, No neck mass, [place]/AT  Cardiovascular: RRR, S1/S2, no rubs, no gallops  Respiratory: CTA bilaterally, no wheezing, no crackles, no rhonchi  Abdomen: Soft/+BS, non tender, non distended, no guarding  Extremities: No edema, No lymphangitis, No petechiae, No rashes, no synovitis   Data Reviewed: I have personally reviewed following labs and imaging studies Basic Metabolic Panel: Recent Labs  Lab 01/27/19 2110 01/28/19 0521 01/29/19 0506  NA 127* 134* 140  K 6.5* 4.5 4.5  CL 91* 101 107  CO2 26 24 26   GLUCOSE 571* 391* 151*  BUN 48* 46* 32*  CREATININE 1.74* 1.59* 1.19*  CALCIUM 9.2 8.6* 8.4*   Liver Function Tests: Recent Labs  Lab 01/27/19 2110 01/28/19 0521  AST 20 20  ALT 31 24  ALKPHOS 98 71  BILITOT 0.6 0.2*  PROT 9.1* 7.2  ALBUMIN 4.1 3.2*   No results for input(s): LIPASE, AMYLASE in the last 168 hours. No results for input(s): AMMONIA in the last 168 hours. Coagulation Profile: No results for input(s): INR, PROTIME in the last 168 hours. CBC: Recent Labs  Lab 01/27/19 2110 01/28/19 0521 01/29/19 0506  WBC 8.8 7.8 7.5  NEUTROABS 6.5  --   --   HGB 12.2 10.7* 10.9*  HCT 40.0 34.9* 36.6  MCV 78.7* 79.9* 81.9  PLT 282 219 231   Cardiac Enzymes: Recent Labs  Lab 01/27/19 2110 01/28/19 0521  TROPONINI  <0.03 0.03*   BNP: Invalid input(s): POCBNP CBG: Recent Labs  Lab 01/28/19 2005 01/28/19 2358 01/29/19 0404 01/29/19 0739 01/29/19 1116  GLUCAP 326* 153* 134* 129* 235*   HbA1C: Recent Labs    01/27/19 2110  HGBA1C 13.1*   Urine analysis:    Component Value Date/Time   COLORURINE YELLOW 01/27/2019 2055   APPEARANCEUR CLOUDY (A) 01/27/2019 2055   LABSPEC 1.018 01/27/2019 2055   PHURINE 6.0 01/27/2019 2055   GLUCOSEU >=500 (A) 01/27/2019 2055   HGBUR SMALL (A) 01/27/2019 2055   BILIRUBINUR NEGATIVE 01/27/2019 2055   KETONESUR NEGATIVE 01/27/2019 2055   PROTEINUR 30 (A) 01/27/2019 2055   UROBILINOGEN 0.2 05/20/2013 0943   NITRITE POSITIVE (A) 01/27/2019 2055   LEUKOCYTESUR LARGE (A) 01/27/2019 2055   Sepsis Labs: @LABRCNTIP (procalcitonin:4,lacticidven:4) ) Recent Results (from the past 240 hour(s))  Urine culture     Status: Abnormal (Preliminary result)   Collection Time: 01/28/19 12:22 AM  Result Value Ref Range Status   Specimen Description   Final    URINE, CATHETERIZED Performed at [facility], [address] [place], [place] [address]    Special  Requests   Final    NONE Performed at [facility], [address], [place], [place] [address]    Culture >=100,000 COLONIES/mL CITROBACTER KOSERI (A)  Final   Report Status PENDING  Incomplete     Scheduled Meds: . aspirin EC  81 mg Oral Daily  . cholecalciferol  5,000 Units Oral Daily  . heparin  5,000 Units Subcutaneous Q8H  . insulin aspart  0-20 Units Subcutaneous TID WC  . insulin aspart  0-5 Units Subcutaneous QHS  . insulin detemir  25 Units Subcutaneous Daily  . loratadine  10 mg Oral Daily  . metoprolol succinate  12.5 mg Oral Daily  . midodrine  5 mg Oral TID WC  . Ensure Max Protein  11 oz Oral BID  . rosuvastatin  20 mg Oral QHS  . vitamin C  500 mg Oral Daily   Continuous Infusions: . sodium chloride 100 mL/hr at 01/29/19 1331  . cefTRIAXone (ROCEPHIN)  IV 1 g (01/29/19 0109)     Procedures/Studies: Dg Chest Port 1 View  Result Date: 01/28/2019 CLINICAL DATA:  Nausea and vomiting. EXAM: PORTABLE CHEST 1 VIEW COMPARISON:  07/07/2018 FINDINGS: Post median sternotomy and CABG. Borderline cardiomegaly. No focal airspace disease, pulmonary edema, pleural effusion or pneumothorax. IMPRESSION: No active disease. Electronically Signed   By: Narda Rutherford M.D.   On: 01/28/2019 02:16    Catarina Hartshorn, DO  [facility] Pager [phone]  If 7PM-7AM, please contact night-coverage [email or web address] Password [facility] 01/29/2019, 3:33 PM   LOS: 0 days

## 2019-01-30 DIAGNOSIS — N183 Chronic kidney disease, stage 3 (moderate): Secondary | ICD-10-CM | POA: Diagnosis not present

## 2019-01-30 DIAGNOSIS — E1100 Type 2 diabetes mellitus with hyperosmolarity without nonketotic hyperglycemic-hyperosmolar coma (NKHHC): Secondary | ICD-10-CM | POA: Diagnosis not present

## 2019-01-30 DIAGNOSIS — N179 Acute kidney failure, unspecified: Secondary | ICD-10-CM | POA: Diagnosis not present

## 2019-01-30 DIAGNOSIS — E875 Hyperkalemia: Secondary | ICD-10-CM | POA: Diagnosis not present

## 2019-01-30 LAB — URINE CULTURE: Culture: >=100000 — AB

## 2019-01-30 LAB — BASIC METABOLIC PANEL
Anion gap: 6 (ref 5–15)
BUN: 29 mg/dL — ABNORMAL HIGH (ref 6–20)
CO2: 24 mmol/L (ref 22–32)
Calcium: 8.3 mg/dL — ABNORMAL LOW (ref 8.9–10.3)
Chloride: 108 mmol/L (ref 98–111)
Creatinine, Ser: 0.97 mg/dL (ref 0.44–1.00)
GFR calc Af Amer: >60 mL/min (ref >60)
GFR calc non Af Amer: >60 mL/min (ref >60)
Glucose, Bld: 275 mg/dL — ABNORMAL HIGH (ref 70–99)
Potassium: 4.7 mmol/L (ref 3.5–5.1)
Sodium: 138 mmol/L (ref 135–145)

## 2019-01-30 LAB — GLUCOSE, CAPILLARY
Glucose-Capillary: 244 mg/dL — ABNORMAL HIGH (ref 70–99)
Glucose-Capillary: 265 mg/dL — ABNORMAL HIGH (ref 70–99)

## 2019-01-30 MED ORDER — CEFDINIR 300 MG PO CAPS: 300.0000 mg | ORAL_CAPSULE | Freq: Two times a day (BID) | ORAL | 0 refills | Status: AC

## 2019-01-30 MED ORDER — INSULIN ASPART 100 UNIT/ML ~~LOC~~ SOLN: 4.0000 [IU] | Freq: Three times a day (TID) | SUBCUTANEOUS | Status: DC

## 2019-01-30 MED ORDER — INSULIN DETEMIR 100 UNIT/ML ~~LOC~~ SOLN
15.0000 [IU] | Freq: Once | SUBCUTANEOUS | Status: AC
  Administered 2019-01-30: 11:00:00 15 [IU] via SUBCUTANEOUS
  Filled 2019-01-30: qty 0.15

## 2019-01-30 MED ORDER — VITAMIN D (ERGOCALCIFEROL) 1.25 MG (50000 UNIT) PO CAPS: 50000.0000 [IU] | ORAL_CAPSULE | ORAL | Status: DC

## 2019-01-30 MED ORDER — CEFDINIR 300 MG PO CAPS
300.0000 mg | ORAL_CAPSULE | Freq: Two times a day (BID) | ORAL | Status: DC
  Administered 2019-01-30: 300 mg via ORAL
  Filled 2019-01-30: qty 1

## 2019-01-30 MED ORDER — INSULIN DETEMIR 100 UNIT/ML ~~LOC~~ SOLN
40.0000 [IU] | Freq: Every day | SUBCUTANEOUS | Status: DC
  Filled 2019-01-30: qty 0.4

## 2019-01-30 NOTE — Discharge Summary (Signed)
Physician Discharge Summary  Jill [name] STM:[id number] DOB: 11/03/1960 DOA: 01/27/2019  PCP: Kela Millin, MD  Admit date: 01/27/2019 Discharge date: 01/30/2019  Admitted From: Home Disposition:  Home   Recommendations for Outpatient Follow-up:  1. Follow up with PCP in 1-2 weeks 2. Please obtain BMP/CBC in one week     Discharge Condition: Stable CODE STATUS: FULL Diet recommendation: Heart Healthy / Carb Modified   Brief/Interim Summary: 58 year old female with a history of coronary artery disease status post CABG, diabetes mellitus type 2, uncontrolled, hypertension, hyperlipidemia, CKD stage III presenting with 3 to 4-day history of generalized weakness, urinary frequency associated with 1 day of nausea and vomiting. The patient had complained of a bifrontal headache that was slightly used worse than usual but denied any fevers, chills, sore throat, coughing, shortness of breath. She denies any sick contacts. The patient recently traveled back from her native [place] on 01/05/2019. She states that since she has returned to [place], she has been following recommendations for social distancing in the setting of the coronavirus pandemic. She denies any sick contacts or COVID-19 positive contacts. On the evening of 01/27/2019, the patient had emesis as discussed that resulted in a syncopal episode. She denied any palpitations, aura, or other prodromal type symptoms. The patient stated that she went to see her family physician approximately 1 week prior to this admission. Apparently, the patient states that her insulin was changed, but she did not understand the directions. As result, the patient stopped taking her insulin altogether. The patient stated that she has not had any insulin for 4 days prior to this admission. Review of the medical record shows she saw her PCP, Dr. Otilio Miu, in [place] on 01/20/19, and she was told to decrease her levemir to 45 units in am (was bid)  and to increase metformin to 1000 mg bid from 500 once daily. She was also started on Bydureon.  From her last visit with Endocrinology, Dr. Fransico Him on 09/06/18 she was told to decrease her levemir to 40 units once daily and to increase her Trulicity.  At the time of presentation, the patient was noted to have a potassium of 6.5 with serum creatinine 1.74 and serum glucose 571.The patient was given Kayexalate and other temporizing measures for potassium and started on IV fluids and insulin with clinical improvement.  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 -this has been a recurrent problem upon review of medical record  Citrobacter koseri UTI -underlying UTI which may have also contributed to the patient's elevated CBGs -Continued ceftriaxone pending culture data -d/c home with cefdinir x 4 more days to complete one week of tx  Diabetes mellitus type 2, uncontrolled with hyperglycemia and neuropathy -Resistant NovoLog sliding scale -Reduced dose Levemir during the hospitalization -Continue gabapentin -pt was told to stop Trulicity and start Byetta at last PCP appointment due to insurance not paying for Trulicity -d/c home with home regimen as noted by her PCP--levemir once daily, Bydureon, metformin -01/27/19 A1C--13.1  Acute on chronic renal failure--CKD stage III -Baseline creatinine 1.1-1.4 -holding lasix during hospitalization -serum creatinine peaked 1.74 -serum creatinine 0.97 on day of d/c  Coronary artery disease/ischemic cardiomyopathy -Status post three-vessel CABG 07/21/2017 -No chest pain presently -Continue aspirin 81 mg daily -Continue statin  and metoprolol succinate  Pulmonary hypertension/mitral  regurgitation -07/22/2017 intraoperative TEE--mild MR, EF 50-55% -Outpatient cardiology follow-up  Orthostatic hypotension -Continue midodrine  Hyperlipidemia -continue statin  Vitamin D deficiency -01/12/19--25vitD = 28 -continue vit D supplement  Discharge Instructions   Allergies as of 01/30/2019   No Known Allergies     Medication List    STOP taking these medications   ibuprofen 600 MG tablet Commonly known as:  ADVIL,MOTRIN   terbinafine 250 MG tablet Commonly known as:  LamISIL     TAKE these medications   acetaminophen 500 MG tablet Commonly known as:  TYLENOL Take 2 tablets (1,000 mg total) by mouth every 6 (six) hours as needed.   aspirin EC 81 MG tablet Take 81 mg by mouth daily.   Bydureon 2 MG Pen Generic drug:  Exenatide ER Inject 2 mg into the skin once a week. Inject  2mg  subcutaneously once weekly   cefdinir 300 MG capsule Commonly known as:  OMNICEF Take 1 capsule (300 mg total) by mouth every 12 (twelve) hours.   furosemide 20 MG tablet Commonly known as:  LASIX Take 1 tablet (20 mg total) by mouth daily with breakfast.   gabapentin 400 MG capsule Commonly known as:  NEURONTIN Take 400 mg by mouth 3 (three) times daily as needed (foot pain).   insulin detemir 100 UNIT/ML injection Commonly known as:  LEVEMIR Inject 45 Units into the skin at bedtime.   loratadine 10 MG tablet Commonly known as:  CLARITIN Take 10 mg by mouth daily.   metFORMIN 500 MG 24 hr tablet Commonly known as:  GLUCOPHAGE-XR Take 2 tablets by mouth 2 (two) times daily.   metoprolol succinate 25 MG 24 hr tablet Commonly known as:  TOPROL-XL Take 0.5 tablets (12.5 mg total) by mouth daily.   midodrine 5 MG tablet Commonly known as:  PROAMATINE Take 1 tablet (5 mg total) by mouth 3 (three) times daily with meals.   rosuvastatin 20 MG tablet Commonly known as:  CRESTOR Take 1 tablet (20 mg total) by mouth at bedtime.   silver sulfADIAZINE 1 %  cream Commonly known as:  SILVADENE Apply 1 application topically daily.   traMADol 50 MG tablet Commonly known as:  ULTRAM Take 1 tablet (50 mg total) by mouth every 6 (six) hours as needed.   vitamin C 500 MG tablet Commonly known as:  ASCORBIC ACID Take 500 mg by mouth daily.   Vitamin D3 125 MCG (5000 UT) Caps Take 1 capsule (5,000 Units total) by mouth daily.       No Known Allergies  Consultations:  none   Procedures/Studies: Dg Chest Port 1 View  Result Date: 01/28/2019 CLINICAL DATA:  Nausea and vomiting. EXAM: PORTABLE CHEST 1 VIEW COMPARISON:  07/07/2018 FINDINGS: Post median sternotomy and CABG. Borderline cardiomegaly. No focal airspace disease, pulmonary edema, pleural effusion or pneumothorax. IMPRESSION: No active disease. Electronically Signed   By: Narda Rutherford M.D.   On: 01/28/2019 02:16        Discharge Exam: Vitals:   01/29/19 2127 01/30/19 0559  BP: (!) 156/61 (!) 154/67  Pulse: 70 70  Resp: 18 18  Temp: 98.3 F (36.8 C) 98.8 F (37.1 C)  SpO2: 94% 98%   Vitals:   01/29/19 0913 01/29/19 1300 01/29/19 2127 01/30/19 0559  BP: (!) 102/39 (!) 112/54 (!) 156/61 (!) 154/67  Pulse:  85 70 70  Resp:  17 18 18   Temp:  98.6 F (37 C) 98.3 F (36.8 C) 98.8  F (37.1 C)  TempSrc:  Oral Oral Oral  SpO2:  (!) 8% 94% 98%  Weight:    103 kg  Height:        General: Pt is alert, awake, not in acute distress Cardiovascular: RRR, S1/S2 +, no rubs, no gallops Respiratory: CTA bilaterally, no wheezing, no rhonchi Abdominal: Soft, NT, ND, bowel sounds + Extremities: no edema, no cyanosis   The results of significant diagnostics from this hospitalization (including imaging, microbiology, ancillary and laboratory) are listed below for reference.    Significant Diagnostic Studies: Dg Chest Port 1 View  Result Date: 01/28/2019 CLINICAL DATA:  Nausea and vomiting. EXAM: PORTABLE CHEST 1 VIEW COMPARISON:  07/07/2018 FINDINGS: Post median sternotomy  and CABG. Borderline cardiomegaly. No focal airspace disease, pulmonary edema, pleural effusion or pneumothorax. IMPRESSION: No active disease. Electronically Signed   By: Narda RutherfordMelanie  Sanford M.D.   On: 01/28/2019 02:16     Microbiology: Recent Results (from the past 240 hour(s))  Urine culture     Status: Abnormal   Collection Time: 01/28/19 12:22 AM  Result Value Ref Range Status   Specimen Description   Final    URINE, CATHETERIZED Performed at [facility], [address] [place], [place] [address]    Special Requests   Final    NONE Performed at [facility], [address] [place], [place] [address]    Culture >=100,000 COLONIES/mL CITROBACTER KOSERI (A)  Final   Report Status 01/30/2019 FINAL  Final   Organism ID, Bacteria CITROBACTER KOSERI (A)  Final      Susceptibility   Citrobacter koseri - MIC*    CEFAZOLIN <=4 SENSITIVE Sensitive     CEFTRIAXONE <=1 SENSITIVE Sensitive     CIPROFLOXACIN <=0.25 SENSITIVE Sensitive     GENTAMICIN <=1 SENSITIVE Sensitive     IMIPENEM <=0.25 SENSITIVE Sensitive     NITROFURANTOIN 32 SENSITIVE Sensitive     TRIMETH/SULFA <=20 SENSITIVE Sensitive     PIP/TAZO <=4 SENSITIVE Sensitive     * >=100,000 COLONIES/mL CITROBACTER KOSERI     Labs: Basic Metabolic Panel: Recent Labs  Lab 01/27/19 2110 01/28/19 0521 01/29/19 0506 01/30/19 0519  NA 127* 134* 140 138  K 6.5* 4.5 4.5 4.7  CL 91* 101 107 108  CO2 26 24 26 24   GLUCOSE 571* 391* 151* 275*  BUN 48* 46* 32* 29*  CREATININE 1.74* 1.59* 1.19* 0.97  CALCIUM 9.2 8.6* 8.4* 8.3*   Liver Function Tests: Recent Labs  Lab 01/27/19 2110 01/28/19 0521  AST 20 20  ALT 31 24  ALKPHOS 98 71  BILITOT 0.6 0.2*  PROT 9.1* 7.2  ALBUMIN 4.1 3.2*   No results for input(s): LIPASE, AMYLASE in the last 168 hours. No results for input(s): AMMONIA in the last 168 hours. CBC: Recent Labs  Lab 01/27/19 2110 01/28/19 0521 01/29/19 0506  WBC 8.8 7.8 7.5  NEUTROABS 6.5  --   --   HGB 12.2  10.7* 10.9*  HCT 40.0 34.9* 36.6  MCV 78.7* 79.9* 81.9  PLT 282 219 231   Cardiac Enzymes: Recent Labs  Lab 01/27/19 2110 01/28/19 0521  TROPONINI <0.03 0.03*   BNP: Invalid input(s): POCBNP CBG: Recent Labs  Lab 01/29/19 1116 01/29/19 1626 01/29/19 1950 01/30/19 0040 01/30/19 0745  GLUCAP 235* 306* 190* 265* 244*    Time coordinating discharge:  36 minutes  Signed:  Catarina Hartshornavid Caelen Reierson, DO [facility] Pager: [phone] 01/30/2019, 10:02 AM

## 2019-01-30 NOTE — Progress Notes (Signed)
Nsg Discharge Note  Admit Date:  01/27/2019 Discharge date: 01/30/2019   Drue Dun to be D/C'd Home per MD order.  AVS completed.  Copy for chart, and copy for patient signed, and dated. Patient/caregiver able to verbalize understanding.  Discharge Medication: Allergies as of 01/30/2019   No Known Allergies     Medication List    STOP taking these medications   ibuprofen 600 MG tablet Commonly known as:  ADVIL,MOTRIN   terbinafine 250 MG tablet Commonly known as:  LamISIL     TAKE these medications   acetaminophen 500 MG tablet Commonly known as:  TYLENOL Take 2 tablets (1,000 mg total) by mouth every 6 (six) hours as needed.   aspirin EC 81 MG tablet Take 81 mg by mouth daily.   Bydureon 2 MG Pen Generic drug:  Exenatide ER Inject 2 mg into the skin once a week. Inject  2mg  subcutaneously once weekly   cefdinir 300 MG capsule Commonly known as:  OMNICEF Take 1 capsule (300 mg total) by mouth every 12 (twelve) hours.   furosemide 20 MG tablet Commonly known as:  LASIX Take 1 tablet (20 mg total) by mouth daily with breakfast.   gabapentin 400 MG capsule Commonly known as:  NEURONTIN Take 400 mg by mouth 3 (three) times daily as needed (foot pain).   insulin detemir 100 UNIT/ML injection Commonly known as:  LEVEMIR Inject 45 Units into the skin at bedtime.   loratadine 10 MG tablet Commonly known as:  CLARITIN Take 10 mg by mouth daily.   metFORMIN 500 MG 24 hr tablet Commonly known as:  GLUCOPHAGE-XR Take 2 tablets by mouth 2 (two) times daily.   metoprolol succinate 25 MG 24 hr tablet Commonly known as:  TOPROL-XL Take 0.5 tablets (12.5 mg total) by mouth daily.   midodrine 5 MG tablet Commonly known as:  PROAMATINE Take 1 tablet (5 mg total) by mouth 3 (three) times daily with meals.   rosuvastatin 20 MG tablet Commonly known as:  CRESTOR Take 1 tablet (20 mg total) by mouth at bedtime.   silver sulfADIAZINE 1 % cream Commonly known as:   SILVADENE Apply 1 application topically daily.   traMADol 50 MG tablet Commonly known as:  ULTRAM Take 1 tablet (50 mg total) by mouth every 6 (six) hours as needed.   vitamin C 500 MG tablet Commonly known as:  ASCORBIC ACID Take 500 mg by mouth daily.   Vitamin D3 125 MCG (5000 UT) Caps Take 1 capsule (5,000 Units total) by mouth daily.       Discharge Assessment: Vitals:   01/29/19 2127 01/30/19 0559  BP: (!) 156/61 (!) 154/67  Pulse: 70 70  Resp: 18 18  Temp: 98.3 F (36.8 C) 98.8 F (37.1 C)  SpO2: 94% 98%   Skin clean, dry and intact without evidence of skin break down, no evidence of skin tears noted. IV catheter discontinued intact. Site without signs and symptoms of complications - no redness or edema noted at insertion site, patient denies c/o pain - only slight tenderness at site.  Dressing with slight pressure applied.  D/c Instructions-Education: Discharge instructions given to patient/family with verbalized understanding. D/c education completed with patient/family including follow up instructions, medication list, d/c activities limitations if indicated, with other d/c instructions as indicated by MD - patient able to verbalize understanding, all questions fully answered. Patient instructed to return to ED, call 911, or call MD for any changes in condition.  Patient escorted via WC,  and D/C home via private auto.  Andria Rhein, RN 01/30/2019 10:53 AM

## 2019-02-02 ENCOUNTER — Ambulatory Visit (INDEPENDENT_AMBULATORY_CARE_PROVIDER_SITE_OTHER): Payer: Medicaid Other

## 2019-02-02 ENCOUNTER — Other Ambulatory Visit: Payer: Self-pay

## 2019-02-02 ENCOUNTER — Ambulatory Visit: Payer: Medicaid Other | Admitting: Podiatry

## 2019-02-02 VITALS — Temp 96.3°F

## 2019-02-02 DIAGNOSIS — M14671 Charcot's joint, right ankle and foot: Secondary | ICD-10-CM

## 2019-02-02 DIAGNOSIS — E0843 Diabetes mellitus due to underlying condition with diabetic autonomic (poly)neuropathy: Secondary | ICD-10-CM

## 2019-02-02 DIAGNOSIS — B351 Tinea unguium: Secondary | ICD-10-CM

## 2019-02-02 NOTE — Addendum Note (Signed)
Addended by: Marylou Mccoy on: 02/02/2019 09:54 AM   Modules accepted: Orders

## 2019-02-02 NOTE — Progress Notes (Signed)
   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  Since last visit the patient has been spending time with family in [place] for the last 3 months.  She returned here to the [place] on 01/05/2019.  Patient states over the past 2 months she has improved significantly.  The swelling and edema has decreased. She has also been taking the Lamisil as directed for the toenail fungus.  No new complaints.  Past Medical History:  Diagnosis Date  . CAD (coronary artery disease)    a. s/p CABG in 07/2017 with LIMA-LAD, SVG-D1, SVG-OM2, and SVG-PDA  . Diabetic neuropathy (HCC)   . Dyspnea   . Dysrhythmia   . Headache   . Hyperkalemia   . Hyperlipidemia   . Hypertension   . Type 2 diabetes mellitus (HCC)      Physical Exam: General: The patient is alert and oriented x3 in no acute distress.  Dermatology: Hyperkeratotic, dystrophic nails noted 1-5 bilateral consistent with onychomycosis.  Skin is warm, dry and supple bilateral lower extremities. Negative for open lesions or macerations.  Vascular: Resolved erythema and edema of the right foot. Palpable pedal pulses bilaterally. Capillary refill within normal limits.  Neurological: Epicritic and protective threshold diminished bilaterally.   Musculoskeletal Exam: Range of motion within normal limits to all pedal and ankle joints bilateral. Muscle strength 5/5 in all groups bilateral.   Radiographic exam: Mildly displaced comminuted fracture medial aspect of the navicular noted.  Mild cortical destruction throughout the midtarsal joint. Minimal change from prior X-Ray.  X-rays appear stable and unchanged.  Assessment: 1.  Charcot neuroarthropathy right lower extremity - stable  2.  Onychomycosis 1-5 bilateral  Plan of Care:  1. Patient evaluated.  X-rays reviewed.  2.  Today we will provide the patient with a prescription for diabetic  shoes with diabetic insoles 3.  Continue wearing good supportive tennis shoes in the meantime 4.  Stressed importance of controlling patient's blood glucose levels and being monitored by primary care 5.  Continue Lamisil as prescribed  6.  Return to clinic as needed     Felecia Shelling, DPM [facility]  Dr. Felecia Shelling, DPM    [address] [place], [place] [address]                Office [phone]  Fax [phone]

## 2019-03-14 ENCOUNTER — Ambulatory Visit: Payer: Self-pay | Admitting: Nutrition

## 2019-03-29 ENCOUNTER — Ambulatory Visit: Payer: Medicaid Other | Admitting: Nutrition

## 2019-03-29 ENCOUNTER — Telehealth: Payer: Self-pay | Admitting: Nutrition

## 2019-03-29 NOTE — Telephone Encounter (Signed)
Lett message with family member to have her call back to complete DM follow up visit on phone.

## 2019-06-08 ENCOUNTER — Other Ambulatory Visit (HOSPITAL_COMMUNITY): Payer: Self-pay | Admitting: *Deleted

## 2019-06-08 DIAGNOSIS — Z1231 Encounter for screening mammogram for malignant neoplasm of breast: Secondary | ICD-10-CM

## 2019-06-23 ENCOUNTER — Other Ambulatory Visit: Payer: Self-pay

## 2019-06-23 ENCOUNTER — Ambulatory Visit (HOSPITAL_COMMUNITY)
Admission: RE | Admit: 2019-06-23 | Discharge: 2019-06-23 | Disposition: A | Discharge status: Home / Self Care | Payer: Medicaid Other | Source: Ambulatory Visit | Attending: *Deleted | Admitting: *Deleted

## 2019-06-23 DIAGNOSIS — Z1231 Encounter for screening mammogram for malignant neoplasm of breast: Secondary | ICD-10-CM | POA: Diagnosis present

## 2019-07-19 ENCOUNTER — Emergency Department (HOSPITAL_COMMUNITY)
Admission: EM | Admit: 2019-07-19 | Discharge: 2019-07-19 | Disposition: A | Discharge status: Home / Self Care | Payer: Medicaid Other | Attending: Emergency Medicine | Admitting: Emergency Medicine

## 2019-07-19 ENCOUNTER — Encounter (HOSPITAL_COMMUNITY): Payer: Self-pay

## 2019-07-19 ENCOUNTER — Emergency Department (HOSPITAL_COMMUNITY): Payer: Medicaid Other

## 2019-07-19 ENCOUNTER — Other Ambulatory Visit: Payer: Self-pay

## 2019-07-19 DIAGNOSIS — E782 Mixed hyperlipidemia: Secondary | ICD-10-CM | POA: Insufficient documentation

## 2019-07-19 DIAGNOSIS — E119 Type 2 diabetes mellitus without complications: Secondary | ICD-10-CM | POA: Diagnosis not present

## 2019-07-19 DIAGNOSIS — I2510 Atherosclerotic heart disease of native coronary artery without angina pectoris: Secondary | ICD-10-CM | POA: Insufficient documentation

## 2019-07-19 DIAGNOSIS — N183 Chronic kidney disease, stage 3 (moderate): Secondary | ICD-10-CM | POA: Insufficient documentation

## 2019-07-19 DIAGNOSIS — Z794 Long term (current) use of insulin: Secondary | ICD-10-CM | POA: Diagnosis not present

## 2019-07-19 DIAGNOSIS — X509XXA Other and unspecified overexertion or strenuous movements or postures, initial encounter: Secondary | ICD-10-CM | POA: Diagnosis not present

## 2019-07-19 DIAGNOSIS — Y929 Unspecified place or not applicable: Secondary | ICD-10-CM | POA: Diagnosis not present

## 2019-07-19 DIAGNOSIS — I4892 Unspecified atrial flutter: Secondary | ICD-10-CM | POA: Diagnosis not present

## 2019-07-19 DIAGNOSIS — Y9389 Activity, other specified: Secondary | ICD-10-CM | POA: Insufficient documentation

## 2019-07-19 DIAGNOSIS — S8991XA Unspecified injury of right lower leg, initial encounter: Secondary | ICD-10-CM | POA: Diagnosis present

## 2019-07-19 DIAGNOSIS — I129 Hypertensive chronic kidney disease with stage 1 through stage 4 chronic kidney disease, or unspecified chronic kidney disease: Secondary | ICD-10-CM | POA: Diagnosis not present

## 2019-07-19 DIAGNOSIS — Y999 Unspecified external cause status: Secondary | ICD-10-CM | POA: Diagnosis not present

## 2019-07-19 DIAGNOSIS — R600 Localized edema: Secondary | ICD-10-CM | POA: Diagnosis not present

## 2019-07-19 DIAGNOSIS — Z951 Presence of aortocoronary bypass graft: Secondary | ICD-10-CM | POA: Insufficient documentation

## 2019-07-19 NOTE — Discharge Instructions (Addendum)
You were in the emergency department for right knee pain.  X-ray showed some fluid in the joint and some degenerative changes to the inside part of your knee.  No fractures, dislocation.  You reported the right leg swelling has been there for several months and is not new.  You have had ultrasounds of your legs and do not have any blood clots.  We will treat your symptoms with anti-inflammatories, brace, ice, elevation and rest.  Take 1000 mg of acetaminophen every 6 hours.  Ice for 20 minutes at a time every 6-8 hours throughout the day.  Elevate your leg.  Use your brace to help with compression and stability.  Avoid putting any weight in your knee for the next 48 to 72 hours to avoid further injury.  Follow-up with your primary care doctor in the next 7 days of your symptoms not improving.  Return to the ER if there is increased pain, redness, warmth, fevers, calf pain.

## 2019-07-19 NOTE — ED Triage Notes (Signed)
Pt felt a popping in her right knee 3 days ago. Is able to walk, but with pain. Pain progressively getting worse.

## 2019-07-19 NOTE — ED Provider Notes (Signed)
[facility] EMERGENCY DEPARTMENT Provider Note   CSN: [id number] Arrival date & time: 07/19/19  1433     History   Chief Complaint Chief Complaint  Patient presents with  . Knee Pain    HPI Jill [name] is a 58 y.o. female.  History of CAD s/p CABG, diabetes on IM medications, diabetic neuropathy, chronic lower extremity edema, hyperlipidemia, hypertension presents today for evaluation of right knee pain that began on Friday.  Patient states she was walking when she suddenly heard a crunching and popping noise in her right knee.  Pain was mild initially and has gradually worsened over the last few days  She has been Tylenol with only minimal transient relief of her pain.  Has associated mild right knee swelling.  Pain is worse with weightbearing but otherwise has no pain with range of motion.  No recent trauma, falls.  No previous history of injury or surgeries to this knee.  Denies any redness, warmth, calf pain.  Reports she has had right leg swelling for months.  She usually wakes up and she has no swelling but as the day progresses and she walks the right leg will swell up.  Currently her leg is swollen but states this is typical for her.  States she has had ultrasounds of this leg to rule out a blood clot.  No anticoagulants.  No IV drug use.    HPI  Past Medical History:  Diagnosis Date  . CAD (coronary artery disease)    a. s/p CABG in 07/2017 with LIMA-LAD, SVG-D1, SVG-OM2, and SVG-PDA  . Diabetic neuropathy (HCC)   . Dyspnea   . Dysrhythmia   . Headache   . Hyperkalemia   . Hyperlipidemia   . Hypertension   . Type 2 diabetes mellitus [facility])     Patient Active Problem List   Diagnosis Date Noted  . Acute lower UTI 01/28/2019  . Diabetes mellitus with nonketotic hyperosmolarity (HCC) 01/28/2019  . AKI (acute kidney injury) (HCC)   . Urinary tract infection without hematuria   . Vitamin D deficiency 09/06/2018  . Morbid obesity (HCC) 08/24/2018  . S/P CABG x 4  07/22/2017  . Dyspnea   . Coronary artery disease   . Severe mitral insufficiency   . Atrial flutter (HCC)   . Mitral regurgitation 06/24/2017  . Cardiomyopathy (HCC) 06/20/2017  . Elevated troponin 06/19/2017  . CKD (chronic kidney disease) stage 3, GFR 30-59 ml/min (HCC) 06/19/2017  . Pressure injury of skin 06/19/2017  . Hyperkalemia 06/18/2017  . DM type 2 causing vascular disease (HCC) 06/18/2017  . Mixed hyperlipidemia 06/18/2017  . Chest pain 06/18/2017  . Bronchopneumonia 03/06/2016  . CAP (community acquired pneumonia) 03/05/2016  . Insulin dependent diabetes mellitus (HCC) 03/05/2016  . Essential hypertension, benign 03/05/2016  . Hyponatremia 03/05/2016  . Normocytic anemia 03/05/2016  . Hyperglycemia 03/05/2016  . Dehydration 03/05/2016  . Hypoxia 03/05/2016  . Leukocytosis 03/05/2016  . Acute respiratory failure with hypoxia (HCC) 03/05/2016    Past Surgical History:  Procedure Laterality Date  . CORONARY ARTERY BYPASS GRAFT N/A 07/22/2017   Procedure: CORONARY ARTERY BYPASS GRAFTING (CABG) x four, using left internal mammary artery and right leg greater saphenous vein harvested endoscopically;  Surgeon: Loreli Slot, MD;  Location: [facility] OR;  Service: Open Heart Surgery;  Laterality: N/A;  . RIGHT/LEFT HEART CATH AND CORONARY ANGIOGRAPHY N/A 06/26/2017   Procedure: RIGHT/LEFT HEART CATH AND CORONARY ANGIOGRAPHY;  Surgeon: Marykay Lex, MD;  Location: [facility] INVASIVE CV LAB;  Service: Cardiovascular;  Laterality: N/A;  . TEE WITHOUT CARDIOVERSION N/A 06/25/2017   Procedure: TRANSESOPHAGEAL ECHOCARDIOGRAM (TEE);  Surgeon: Sanda Klein, MD;  Location: [facility];  Service: Cardiovascular;  Laterality: N/A;  . TEE WITHOUT CARDIOVERSION N/A 07/22/2017   Procedure: TRANSESOPHAGEAL ECHOCARDIOGRAM (TEE);  Surgeon: Melrose Nakayama, MD;  Location: [place];  Service: Open Heart Surgery;  Laterality: N/A;  . TUBAL LIGATION       OB History    Gravida  4   Para   4   Term  4   Preterm      AB      Living        SAB      TAB      Ectopic      Multiple      Live Births               Home Medications    Prior to Admission medications   Medication Sig Start Date End Date Taking? Authorizing Provider  acetaminophen (TYLENOL) 500 MG tablet Take 2 tablets (1,000 mg total) by mouth every 6 (six) hours as needed. 07/27/17   Barrett, Lodema Hong, PA-C  aspirin EC 81 MG tablet Take 81 mg by mouth daily.    [provider]  cefdinir (OMNICEF) 300 MG capsule Take 1 capsule (300 mg total) by mouth every 12 (twelve) hours. 01/30/19   Orson Eva, MD  Cholecalciferol (VITAMIN D3) 125 MCG (5000 UT) CAPS Take 1 capsule (5,000 Units total) by mouth daily. 09/06/18   Cassandria Anger, MD  Exenatide ER (BYDUREON) 2 MG PEN Inject 2 mg into the skin once a week. Inject  2mg  subcutaneously once weekly    [provider]  furosemide (LASIX) 20 MG tablet Take 1 tablet (20 mg total) by mouth daily with breakfast. 08/24/18   Nida, Marella Chimes, MD  gabapentin (NEURONTIN) 400 MG capsule Take 400 mg by mouth 3 (three) times daily as needed (foot pain).     [provider]  insulin detemir (LEVEMIR) 100 UNIT/ML injection Inject 45 Units into the skin at bedtime.     [provider]  loratadine (CLARITIN) 10 MG tablet Take 10 mg by mouth daily.    [provider]  metoprolol succinate (TOPROL-XL) 25 MG 24 hr tablet Take 0.5 tablets (12.5 mg total) by mouth daily. 09/10/18 09/05/19  Strader, Fransisco Hertz, PA-C  midodrine (PROAMATINE) 5 MG tablet Take 1 tablet (5 mg total) by mouth 3 (three) times daily with meals. 09/10/18   Strader, Fransisco Hertz, PA-C  rosuvastatin (CRESTOR) 20 MG tablet Take 1 tablet (20 mg total) by mouth at bedtime. 09/10/18   Strader, Fransisco Hertz, PA-C  silver sulfADIAZINE (SILVADENE) 1 % cream Apply 1 application topically daily.    [provider]  traMADol (ULTRAM) 50 MG tablet Take 1 tablet  (50 mg total) by mouth every 6 (six) hours as needed. 06/01/18   Evalee Jefferson, PA-C  vitamin C (ASCORBIC ACID) 500 MG tablet Take 500 mg by mouth daily.    [provider]    Family History Family History  Problem Relation Age of Onset  . Diabetes type II Other   . Hypertension Other     Social History Social History   Tobacco Use  . Smoking status: Never Smoker  . Smokeless tobacco: Never Used  Substance Use Topics  . Alcohol use: No  . Drug use: No     Allergies   Patient has no known  allergies.   Review of Systems Review of Systems  Cardiovascular: Positive for leg swelling (right leg swelling).  Musculoskeletal: Positive for arthralgias, gait problem and joint swelling.  All other systems reviewed and are negative.    Physical Exam Updated Vital Signs BP (!) 186/70 (BP Location: Right Arm)   Pulse 89   Temp 97.8 F (36.6 C) (Oral)   Ht 5\' 2"  (1.575 m)   Wt 107.5 kg   SpO2 97%   BMI 43.35 kg/m   Physical Exam Vitals signs and nursing note reviewed.  Constitutional:      General: She is not in acute distress.    Appearance: She is well-developed.     Comments: NAD.  HENT:     Head: Normocephalic and atraumatic.     Right Ear: External ear normal.     Left Ear: External ear normal.     Nose: Nose normal.  Eyes:     General: No scleral icterus.    Conjunctiva/sclera: Conjunctivae normal.  Neck:     Musculoskeletal: Normal range of motion and neck supple.  Cardiovascular:     Rate and Rhythm: Normal rate and regular rhythm.     Heart sounds: Normal heart sounds. No murmur.     Comments: 1+ pitting edema to the right lower extremity from the midfoot to the mid tib/fib.  No erythema, warmth.  No calf tenderness.  1+ DP pulse on the right lower extremity. Pulmonary:     Effort: Pulmonary effort is normal.     Breath sounds: Normal breath sounds. No wheezing.  Musculoskeletal: Normal range of motion.        General: Swelling and tenderness  present. No deformity.     Comments:  Right knee: Mild tenderness to the medial aspect of the knee, medial joint line with edema.  No erythema, warmth.  Full passive range of motion of the knee with normal patellar tracking.  No crepitus.  No tenderness to the lateral knee, patella, quad/patellar tendons.  No popliteal tenderness or fullness.  No obvious deformity of knees including edema, erythema or effusion. Negative Lachman's. Negative posterior drawer test.  Subtle valgus laxity, no varus laxity.    Skin:    General: Skin is warm and dry.     Capillary Refill: Capillary refill takes less than 2 seconds.  Neurological:     Mental Status: She is alert and oriented to person, place, and time.     Comments: Sensation and strength is intact in right lower extremity including great toe.  Psychiatric:        Behavior: Behavior normal.        Thought Content: Thought content normal.        Judgment: Judgment normal.      ED Treatments / Results  Labs (all labs ordered are listed, but only abnormal results are displayed) Labs Reviewed - No data to display  EKG None  Radiology Dg Knee Complete 4 Views Right  Result Date: 07/19/2019 CLINICAL DATA:  Progressive right knee pain over the last 3 days, felt a pop. EXAM: RIGHT KNEE - COMPLETE 4+ VIEW COMPARISON:  None. FINDINGS: No fracture or dislocation. Mild peripheral spurring in the medial tibiofemoral and patellofemoral compartment. Mild medial tibiofemoral joint space narrowing. No erosions, periosteal reaction, or bony destructive change. Possible small joint effusion. Surgical clips in the soft tissues medially. Advanced vascular calcifications. Quadriceps and patellar tendon enthesophytes. IMPRESSION: 1. Mild osteoarthritis. No acute bony abnormality. 2. Possible small joint effusion. 3. Advanced vascular  calcifications for age. Electronically Signed   By: Narda RutherfordMelanie  Sanford M.D.   On: 07/19/2019 19:58    Procedures Procedures (including  critical care time)  Medications Ordered in ED Medications - No data to display   Initial Impression / Assessment and Plan / ED Course  I have reviewed the triage vital signs and the nursing notes.  Pertinent labs & imaging results that were available during my care of the patient were reviewed by me and considered in my medical decision making (see chart for details).  Clinical Course as of Jul 18 2106  Tue Jul 19, 2019  2009 FINDINGS: No fracture or dislocation. Mild peripheral spurring in the medial tibiofemoral and patellofemoral compartment. Mild medial tibiofemoral joint space narrowing. No erosions, periosteal reaction, or bony destructive change. Possible small joint effusion. Surgical clips in the soft tissues medially. Advanced vascular calcifications. Quadriceps and patellar tendon enthesophytes.  DG Knee Complete 4 Views Right [CG]    Clinical Course User Index [CG] Liberty HandyGibbons, Jony Ladnier J, PA-C   High suspicion for soft tissue injury of the knee specifically in the medial aspect due to her exam.  Extremities neurovascularly intact.  No signs of overlying infection such as cellulitis.  She has no pain with range of motion, fevers and I doubt septic arthritis.  She has no history of gout.  No blood thinners to cause hemarthrosis.  She has right lower extremity edema but states this is been there for several months.  I reviewed her chart and there has been well-documented history of right lower extremity edema.  She has had ultrasounds that have been negative for DVT.  She has no pulse deficits.  She has no calf tenderness today.  No chest pain or shortness of breath.  Will discharge with Tylenol, ice, brace, elevation and crutches.  Recommended follow-up with PCP in the next 1 week if symptoms persist.  Return precautions discussed.  Patient is comfortable with this.  Final Clinical Impressions(s) / ED Diagnoses   Final diagnoses:  Soft tissue injury of right knee, initial encounter   Leg edema, right    ED Discharge Orders    None       Jerrell MylarGibbons, Eisen Robenson J, PA-C 07/19/19 2107    Sabas SousBero, Michael M, MD 07/24/19 1044

## 2019-08-05 ENCOUNTER — Telehealth: Payer: Self-pay | Admitting: Podiatry

## 2019-08-05 NOTE — Telephone Encounter (Signed)
pts daughter in law Jill [name] left 2 messages yesterday asking if we could write another Rx for diabetic shoes and send it to [facility] in [place].  Rick contacted [employer] and they told him the rx has to be written by the doctor that is treating her diabetes and has to have been within 3 months.  I contacted daughter in law and gave her that information.

## 2019-11-21 DEATH — deceased

## 2022-05-05 ENCOUNTER — Other Ambulatory Visit: Payer: Self-pay
